# Patient Record
Sex: Female | Born: 1935 | Race: White | Hispanic: No | Marital: Married | Smoking: Never smoker
Health system: Southern US, Community
[De-identification: ages and names within clinical notes are randomized; demographics above are authoritative.]

## PROBLEM LIST (undated history)

## (undated) DIAGNOSIS — E079 Disorder of thyroid, unspecified: Secondary | ICD-10-CM

## (undated) DIAGNOSIS — R011 Cardiac murmur, unspecified: Secondary | ICD-10-CM

## (undated) DIAGNOSIS — E039 Hypothyroidism, unspecified: Secondary | ICD-10-CM

## (undated) DIAGNOSIS — M810 Age-related osteoporosis without current pathological fracture: Secondary | ICD-10-CM

## (undated) DIAGNOSIS — T7840XA Allergy, unspecified, initial encounter: Secondary | ICD-10-CM

## (undated) DIAGNOSIS — G629 Polyneuropathy, unspecified: Secondary | ICD-10-CM

## (undated) DIAGNOSIS — M1990 Unspecified osteoarthritis, unspecified site: Secondary | ICD-10-CM

## (undated) DIAGNOSIS — K219 Gastro-esophageal reflux disease without esophagitis: Secondary | ICD-10-CM

## (undated) DIAGNOSIS — I499 Cardiac arrhythmia, unspecified: Secondary | ICD-10-CM

## (undated) DIAGNOSIS — E785 Hyperlipidemia, unspecified: Secondary | ICD-10-CM

## (undated) DIAGNOSIS — H9190 Unspecified hearing loss, unspecified ear: Secondary | ICD-10-CM

## (undated) DIAGNOSIS — H269 Unspecified cataract: Secondary | ICD-10-CM

## (undated) DIAGNOSIS — I10 Essential (primary) hypertension: Secondary | ICD-10-CM

## (undated) DIAGNOSIS — G43109 Migraine with aura, not intractable, without status migrainosus: Secondary | ICD-10-CM

## (undated) DIAGNOSIS — M8580 Other specified disorders of bone density and structure, unspecified site: Secondary | ICD-10-CM

## (undated) DIAGNOSIS — R609 Edema, unspecified: Secondary | ICD-10-CM

## (undated) HISTORY — DX: Age-related osteoporosis without current pathological fracture: M81.0

## (undated) HISTORY — DX: Disorder of thyroid, unspecified: E07.9

## (undated) HISTORY — PX: TONSILLECTOMY: SUR1361

## (undated) HISTORY — PX: EYE SURGERY: SHX253

## (undated) HISTORY — DX: Cardiac murmur, unspecified: R01.1

## (undated) HISTORY — DX: Migraine with aura, not intractable, without status migrainosus: G43.109

## (undated) HISTORY — PX: OTHER SURGICAL HISTORY: SHX169

## (undated) HISTORY — DX: Essential (primary) hypertension: I10

## (undated) HISTORY — DX: Hyperlipidemia, unspecified: E78.5

## (undated) HISTORY — DX: Unspecified cataract: H26.9

## (undated) HISTORY — DX: Other specified disorders of bone density and structure, unspecified site: M85.80

## (undated) HISTORY — DX: Allergy, unspecified, initial encounter: T78.40XA

---

## 1994-04-18 HISTORY — PX: DOPPLER ECHOCARDIOGRAPHY: SHX263

## 1997-08-03 ENCOUNTER — Ambulatory Visit (HOSPITAL_COMMUNITY): Admission: RE | Admit: 1997-08-03 | Discharge: 1997-08-03 | Payer: Self-pay | Admitting: Obstetrics & Gynecology

## 1998-12-12 ENCOUNTER — Encounter: Admission: RE | Admit: 1998-12-12 | Discharge: 1998-12-12 | Payer: Self-pay | Admitting: Family Medicine

## 1998-12-12 ENCOUNTER — Encounter: Payer: Self-pay | Admitting: Family Medicine

## 1999-12-13 ENCOUNTER — Encounter: Payer: Self-pay | Admitting: Obstetrics and Gynecology

## 1999-12-13 ENCOUNTER — Encounter: Admission: RE | Admit: 1999-12-13 | Discharge: 1999-12-13 | Payer: Self-pay | Admitting: Obstetrics and Gynecology

## 2000-12-24 ENCOUNTER — Encounter: Admission: RE | Admit: 2000-12-24 | Discharge: 2000-12-24 | Payer: Self-pay | Admitting: Obstetrics and Gynecology

## 2000-12-24 ENCOUNTER — Encounter: Payer: Self-pay | Admitting: Obstetrics and Gynecology

## 2001-02-19 ENCOUNTER — Other Ambulatory Visit: Admission: RE | Admit: 2001-02-19 | Discharge: 2001-02-19 | Payer: Self-pay | Admitting: Obstetrics and Gynecology

## 2001-12-30 ENCOUNTER — Encounter: Admission: RE | Admit: 2001-12-30 | Discharge: 2001-12-30 | Payer: Self-pay | Admitting: Obstetrics and Gynecology

## 2001-12-30 ENCOUNTER — Encounter: Payer: Self-pay | Admitting: Obstetrics and Gynecology

## 2002-02-25 ENCOUNTER — Other Ambulatory Visit: Admission: RE | Admit: 2002-02-25 | Discharge: 2002-02-25 | Payer: Self-pay | Admitting: Obstetrics and Gynecology

## 2003-02-06 ENCOUNTER — Encounter: Admission: RE | Admit: 2003-02-06 | Discharge: 2003-02-06 | Payer: Self-pay | Admitting: Family Medicine

## 2004-01-16 ENCOUNTER — Ambulatory Visit: Payer: Self-pay | Admitting: Family Medicine

## 2004-01-26 ENCOUNTER — Ambulatory Visit: Payer: Self-pay | Admitting: Family Medicine

## 2004-01-31 ENCOUNTER — Ambulatory Visit: Payer: Self-pay | Admitting: Family Medicine

## 2004-02-29 ENCOUNTER — Encounter: Admission: RE | Admit: 2004-02-29 | Discharge: 2004-02-29 | Payer: Self-pay | Admitting: Obstetrics and Gynecology

## 2004-08-01 ENCOUNTER — Ambulatory Visit: Payer: Self-pay | Admitting: Family Medicine

## 2004-12-13 ENCOUNTER — Ambulatory Visit: Payer: Self-pay | Admitting: Internal Medicine

## 2005-01-23 ENCOUNTER — Ambulatory Visit: Payer: Self-pay | Admitting: Family Medicine

## 2005-01-27 ENCOUNTER — Ambulatory Visit: Payer: Self-pay | Admitting: Family Medicine

## 2005-04-04 ENCOUNTER — Encounter: Admission: RE | Admit: 2005-04-04 | Discharge: 2005-04-04 | Payer: Self-pay | Admitting: Obstetrics and Gynecology

## 2005-04-10 ENCOUNTER — Ambulatory Visit: Payer: Self-pay | Admitting: Family Medicine

## 2005-07-28 ENCOUNTER — Ambulatory Visit: Payer: Self-pay | Admitting: Family Medicine

## 2005-10-18 ENCOUNTER — Encounter: Payer: Self-pay | Admitting: Family Medicine

## 2005-10-18 LAB — CONVERTED CEMR LAB: Pap Smear: NORMAL

## 2005-12-11 ENCOUNTER — Ambulatory Visit: Payer: Self-pay | Admitting: Internal Medicine

## 2005-12-22 LAB — HM DEXA SCAN

## 2006-01-27 ENCOUNTER — Ambulatory Visit: Payer: Self-pay | Admitting: Family Medicine

## 2006-01-29 ENCOUNTER — Ambulatory Visit: Payer: Self-pay | Admitting: Family Medicine

## 2006-04-06 ENCOUNTER — Encounter: Admission: RE | Admit: 2006-04-06 | Discharge: 2006-04-06 | Payer: Self-pay | Admitting: Family Medicine

## 2006-08-05 ENCOUNTER — Encounter: Payer: Self-pay | Admitting: Family Medicine

## 2006-08-05 DIAGNOSIS — R030 Elevated blood-pressure reading, without diagnosis of hypertension: Secondary | ICD-10-CM | POA: Insufficient documentation

## 2006-08-05 DIAGNOSIS — J309 Allergic rhinitis, unspecified: Secondary | ICD-10-CM

## 2006-08-05 DIAGNOSIS — N951 Menopausal and female climacteric states: Secondary | ICD-10-CM

## 2006-08-05 DIAGNOSIS — H918X9 Other specified hearing loss, unspecified ear: Secondary | ICD-10-CM | POA: Insufficient documentation

## 2006-08-05 DIAGNOSIS — I059 Rheumatic mitral valve disease, unspecified: Secondary | ICD-10-CM

## 2006-08-05 DIAGNOSIS — M8580 Other specified disorders of bone density and structure, unspecified site: Secondary | ICD-10-CM

## 2006-08-05 DIAGNOSIS — E785 Hyperlipidemia, unspecified: Secondary | ICD-10-CM

## 2006-08-05 DIAGNOSIS — R002 Palpitations: Secondary | ICD-10-CM | POA: Insufficient documentation

## 2006-08-05 DIAGNOSIS — E039 Hypothyroidism, unspecified: Secondary | ICD-10-CM | POA: Insufficient documentation

## 2006-08-05 DIAGNOSIS — Z8679 Personal history of other diseases of the circulatory system: Secondary | ICD-10-CM | POA: Insufficient documentation

## 2006-08-06 DIAGNOSIS — T7840XA Allergy, unspecified, initial encounter: Secondary | ICD-10-CM | POA: Insufficient documentation

## 2006-08-17 ENCOUNTER — Ambulatory Visit: Payer: Self-pay | Admitting: Family Medicine

## 2006-11-02 ENCOUNTER — Ambulatory Visit: Payer: Self-pay | Admitting: Family Medicine

## 2006-11-02 LAB — CONVERTED CEMR LAB
Bilirubin Urine: NEGATIVE
Ketones, urine, test strip: NEGATIVE
Nitrite: NEGATIVE
Specific Gravity, Urine: 1.01
pH: 6

## 2007-02-05 ENCOUNTER — Ambulatory Visit: Payer: Self-pay | Admitting: Family Medicine

## 2007-02-05 LAB — CONVERTED CEMR LAB
ALT: 21 units/L (ref 0–35)
Albumin: 4.1 g/dL (ref 3.5–5.2)
Alkaline Phosphatase: 63 units/L (ref 39–117)
CO2: 31 meq/L (ref 19–32)
Calcium: 9.8 mg/dL (ref 8.4–10.5)
Potassium: 5.4 meq/L — ABNORMAL HIGH (ref 3.5–5.1)
Sodium: 137 meq/L (ref 135–145)
Total Bilirubin: 1.1 mg/dL (ref 0.3–1.2)
Total Protein: 6.6 g/dL (ref 6.0–8.3)

## 2007-02-22 ENCOUNTER — Ambulatory Visit: Payer: Self-pay | Admitting: Family Medicine

## 2007-03-23 ENCOUNTER — Ambulatory Visit: Payer: Self-pay | Admitting: Family Medicine

## 2007-03-24 ENCOUNTER — Encounter (INDEPENDENT_AMBULATORY_CARE_PROVIDER_SITE_OTHER): Payer: Self-pay | Admitting: *Deleted

## 2007-03-29 ENCOUNTER — Ambulatory Visit: Payer: Self-pay | Admitting: Family Medicine

## 2007-03-30 LAB — CONVERTED CEMR LAB: Potassium: 5.1 meq/L (ref 3.5–5.1)

## 2007-04-08 ENCOUNTER — Encounter: Admission: RE | Admit: 2007-04-08 | Discharge: 2007-04-08 | Payer: Self-pay | Admitting: Family Medicine

## 2007-04-14 ENCOUNTER — Encounter (INDEPENDENT_AMBULATORY_CARE_PROVIDER_SITE_OTHER): Payer: Self-pay | Admitting: *Deleted

## 2007-04-28 ENCOUNTER — Ambulatory Visit: Payer: Self-pay | Admitting: Family Medicine

## 2007-12-16 ENCOUNTER — Ambulatory Visit: Payer: Self-pay | Admitting: Family Medicine

## 2008-01-06 ENCOUNTER — Ambulatory Visit: Payer: Self-pay | Admitting: Internal Medicine

## 2008-01-06 ENCOUNTER — Encounter: Payer: Self-pay | Admitting: Family Medicine

## 2008-02-02 ENCOUNTER — Ambulatory Visit: Payer: Self-pay | Admitting: Family Medicine

## 2008-03-23 ENCOUNTER — Ambulatory Visit: Payer: Self-pay | Admitting: Family Medicine

## 2008-03-24 LAB — CONVERTED CEMR LAB
AST: 28 units/L (ref 0–37)
Albumin: 4.2 g/dL (ref 3.5–5.2)
Alkaline Phosphatase: 57 units/L (ref 39–117)
Bilirubin, Direct: 0.1 mg/dL (ref 0.0–0.3)
Calcium: 9.7 mg/dL (ref 8.4–10.5)
Chloride: 107 meq/L (ref 96–112)
Cholesterol: 164 mg/dL (ref 0–200)
Eosinophils Absolute: 0.1 10*3/uL (ref 0.0–0.7)
Eosinophils Relative: 2.1 % (ref 0.0–5.0)
GFR calc Af Amer: 70 mL/min
GFR calc non Af Amer: 58 mL/min
HCT: 44.1 % (ref 36.0–46.0)
HDL: 65.6 mg/dL (ref >39.0)
Monocytes Absolute: 0.6 10*3/uL (ref 0.1–1.0)
Neutro Abs: 3.9 10*3/uL (ref 1.4–7.7)
Neutrophils Relative %: 56.6 % (ref 43.0–77.0)
Sodium: 141 meq/L (ref 135–145)
Total Bilirubin: 0.8 mg/dL (ref 0.3–1.2)
Triglycerides: 94 mg/dL (ref 0–149)
VLDL: 19 mg/dL (ref 0–40)

## 2008-03-28 ENCOUNTER — Ambulatory Visit: Payer: Self-pay | Admitting: Family Medicine

## 2008-04-10 ENCOUNTER — Encounter: Admission: RE | Admit: 2008-04-10 | Discharge: 2008-04-10 | Payer: Self-pay | Admitting: Family Medicine

## 2008-04-12 ENCOUNTER — Encounter (INDEPENDENT_AMBULATORY_CARE_PROVIDER_SITE_OTHER): Payer: Self-pay | Admitting: *Deleted

## 2008-05-16 ENCOUNTER — Ambulatory Visit: Payer: Self-pay | Admitting: Family Medicine

## 2008-05-16 ENCOUNTER — Encounter (INDEPENDENT_AMBULATORY_CARE_PROVIDER_SITE_OTHER): Payer: Self-pay | Admitting: *Deleted

## 2008-05-16 LAB — CONVERTED CEMR LAB
OCCULT 1: NEGATIVE
OCCULT 3: NEGATIVE

## 2008-06-22 ENCOUNTER — Ambulatory Visit: Payer: Self-pay | Admitting: Family Medicine

## 2008-06-22 LAB — CONVERTED CEMR LAB
CO2: 32 meq/L (ref 19–32)
Glucose, Bld: 91 mg/dL (ref 70–99)
Sodium: 139 meq/L (ref 135–145)

## 2008-06-27 ENCOUNTER — Ambulatory Visit: Payer: Self-pay | Admitting: Family Medicine

## 2008-08-03 ENCOUNTER — Ambulatory Visit: Payer: Self-pay | Admitting: Family Medicine

## 2008-08-03 LAB — CONVERTED CEMR LAB
BUN: 19 mg/dL (ref 6–23)
Calcium: 9.5 mg/dL (ref 8.4–10.5)
Creatinine, Ser: 1 mg/dL (ref 0.4–1.2)
GFR calc non Af Amer: 57.8 mL/min (ref >60)
Glucose, Bld: 95 mg/dL (ref 70–99)

## 2008-08-08 ENCOUNTER — Ambulatory Visit: Payer: Self-pay | Admitting: Family Medicine

## 2008-09-21 ENCOUNTER — Ambulatory Visit: Payer: Self-pay | Admitting: Family Medicine

## 2009-03-30 ENCOUNTER — Ambulatory Visit: Payer: Self-pay | Admitting: Family Medicine

## 2009-03-31 LAB — CONVERTED CEMR LAB
Alkaline Phosphatase: 62 units/L (ref 39–117)
BUN: 17 mg/dL (ref 6–23)
Basophils Absolute: 0.1 10*3/uL (ref 0.0–0.1)
CO2: 30 meq/L (ref 19–32)
Calcium: 9.7 mg/dL (ref 8.4–10.5)
Chloride: 102 meq/L (ref 96–112)
Free T4: 1.3 ng/dL (ref 0.6–1.6)
GFR calc non Af Amer: 57.69 mL/min (ref >60)
HCT: 44.6 % (ref 36.0–46.0)
Lymphocytes Relative: 27.9 % (ref 12.0–46.0)
Lymphs Abs: 2.1 10*3/uL (ref 0.7–4.0)
Monocytes Absolute: 0.6 10*3/uL (ref 0.1–1.0)
Neutrophils Relative %: 61.4 % (ref 43.0–77.0)
RBC: 4.75 M/uL (ref 3.87–5.11)
RDW: 13.5 % (ref 11.5–14.6)
Sodium: 138 meq/L (ref 135–145)
TSH: 0.8 microintl units/mL (ref 0.35–5.50)
Total Bilirubin: 0.8 mg/dL (ref 0.3–1.2)
Total Protein: 6.9 g/dL (ref 6.0–8.3)
Triglycerides: 83 mg/dL (ref 0.0–149.0)
WBC: 7.7 10*3/uL (ref 4.5–10.5)

## 2009-04-04 ENCOUNTER — Ambulatory Visit: Payer: Self-pay | Admitting: Family Medicine

## 2009-04-11 ENCOUNTER — Encounter: Admission: RE | Admit: 2009-04-11 | Discharge: 2009-04-11 | Payer: Self-pay | Admitting: Family Medicine

## 2009-06-01 ENCOUNTER — Ambulatory Visit: Payer: Self-pay | Admitting: Family Medicine

## 2009-06-01 LAB — CONVERTED CEMR LAB
OCCULT 1: NEGATIVE
OCCULT 3: NEGATIVE

## 2009-06-04 ENCOUNTER — Encounter (INDEPENDENT_AMBULATORY_CARE_PROVIDER_SITE_OTHER): Payer: Self-pay | Admitting: *Deleted

## 2009-06-22 ENCOUNTER — Ambulatory Visit: Payer: Self-pay | Admitting: Family Medicine

## 2009-06-22 LAB — CONVERTED CEMR LAB
Chloride: 99 meq/L (ref 96–112)
Creatinine, Ser: 0.8 mg/dL (ref 0.4–1.2)
GFR calc non Af Amer: 71.49 mL/min (ref >60)

## 2009-06-25 ENCOUNTER — Ambulatory Visit: Payer: Self-pay | Admitting: Family Medicine

## 2009-06-27 ENCOUNTER — Ambulatory Visit: Payer: Self-pay | Admitting: Family Medicine

## 2009-09-25 ENCOUNTER — Encounter (INDEPENDENT_AMBULATORY_CARE_PROVIDER_SITE_OTHER): Payer: Self-pay | Admitting: *Deleted

## 2009-12-13 ENCOUNTER — Ambulatory Visit: Payer: Self-pay | Admitting: Family Medicine

## 2010-03-05 ENCOUNTER — Ambulatory Visit
Admission: RE | Admit: 2010-03-05 | Discharge: 2010-03-05 | Payer: Self-pay | Source: Home / Self Care | Attending: Family Medicine | Admitting: Family Medicine

## 2010-03-05 DIAGNOSIS — M7710 Lateral epicondylitis, unspecified elbow: Secondary | ICD-10-CM | POA: Insufficient documentation

## 2010-03-10 ENCOUNTER — Encounter: Payer: Self-pay | Admitting: Family Medicine

## 2010-03-21 NOTE — Letter (Signed)
Summary: Results Follow up Letter  [place] at [facility]  [address] [place], [place] [address]   Phone: [phone]  Fax: [phone]    06/04/2009 MRN: [id number]  [facility] [address] [place], [place]  [address]  Dear Ms. Detore,  The following are the results of your recent test(s):  Test         Result    Pap Smear:        Normal _____  Not Normal _____ Comments: ______________________________________________________ Cholesterol: LDL(Bad cholesterol):         Your goal is less than:         HDL (Good cholesterol):       Your goal is more than: Comments:  ______________________________________________________ Mammogram:        Normal _____  Not Normal _____ Comments:  ___________________________________________________________________ Hemoccult:        Normal _X____  Not normal _______ Comments: Please repeat in one year.  _____________________________________________________________________ Other Tests:    We routinely do not discuss normal results over the telephone.  If you desire a copy of the results, or you have any questions about this information we can discuss them at your next office visit.   Sincerely,     Laurita Quint, MD

## 2010-03-21 NOTE — Assessment & Plan Note (Signed)
Summary: CPX / LFW   Vital Signs:  Patient profile:   77 year old female Weight:      149.75 pounds Temp:     98.3 degrees F oral Pulse rate:   64 / minute Pulse rhythm:   regular BP sitting:   140 / 70  (left arm) Cuff size:   regular  Vitals Entered By: Sydell Axon LPN (April 04, 2009 2:00 PM) CC: 30 Minute checkup, sees Dr. Rosemary Holms for her GYN care, hemoccult cards given to patient   History of Present Illness: Pt here for Comp Exam. She has mammo next Wed and appt with Dr Rosemary Holms next month. She had an infected tooth removed last week. She is having fogginess of mind. She alsoi has more difficulty hearing,, part of it she thinks is her husband mumbling. Didn't really walk this Winter.  Preventive Screening-Counseling & Management  Alcohol-Tobacco     Alcohol drinks/day: <1     Alcohol type: occasional wine     Smoking Status: never     Passive Smoke Exposure: no  Caffeine-Diet-Exercise     Caffeine use/day: 2     Does Patient Exercise: no     Type of exercise: walking     Times/week: 3  Problems Prior to Update: 1)  Special Screening Malig Neoplasms Other Sites  (ICD-V76.49) 2)  Other Screening Mammogram  (ICD-V76.12) 3)  Allergy  (ICD-995.3) 4)  Palpitations  (ICD-785.1) 5)  Hearing Loss, High Frequency  (ICD-389.8) 6)  Postmenopausal Status  (ICD-627.2) 7)  Mitral Valve Prolapse  (ICD-424.0) 8)  Osteopenia  (ICD-733.90) 9)  Hypothyroidism  (ICD-244.9) 10)  Hypertension  (ICD-401.9) 11)  Hyperlipidemia  (ICD-272.4) 12)  Allergic Rhinitis  (ICD-477.9)  Medications Prior to Update: 1)  Synthroid 100 Mcg Tabs (Levothyroxine Sodium) .Marland Kitchen.. 1 By Mouth Once Daily 2)  Atenolol 25 Mg Tabs (Atenolol) .... 1/2 By Mouth Once Daily 3)  Lipitor 20 Mg Tabs (Atorvastatin Calcium) .Marland Kitchen.. 1 By Mouth Once Daily 4)  Caltrate 600   Tabs (Calcium Carbonate Tabs) .Marland Kitchen.. 1 By Mouth Daily 5)  One-Daily Multivitamins   Tabs (Multiple Vitamin) .Marland Kitchen.. 1 By Mouth Daily 6)  Amoxicillin   Tabs  (Amoxicillin Tabs) .... 2 Grams 1 Hour Prior To Dental Appointment (Prophylaxis) 7)  Eq Saline Nasal Spray   Soln (Saline Soln) .... Prn 8)  Lisinopril 5 Mg Tabs (Lisinopril) .... 1/2  Tab By Mouth in Mornings 9)  Aleve 220 Mg Tabs (Naproxen Sodium) .... As Needed Pain 10)  Claritin 10 Mg Tabs (Loratadine) .Marland Kitchen.. 1 As Needed For Allergies By Mouth  Allergies: No Known Drug Allergies  Past History:  Past Medical History: Last updated: Aug 29, 2006 Allergic rhinitis Hyperlipidemia Hypertension Hypothyroidism Osteopenia  Past Surgical History: Last updated: August 29, 2006                  NSVD x 2 as child     HOS Prolonged Fever sev days (?Rheum Fever?) as child    Tonsillectomy 3/96          Echo - MVP with MR (TR) - prophylaxis 96             Flex - nml 40 cm. 03/11/02     DEXA - osteopenia                  Holter - NSR, freq. PVC's, rare colp, freq. 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 Eyes:  Denies blurring, discharge, and eye pain; has yearly eye exam. ENT:  Complains of decreased hearing and  ringing in ears; denies ear discharge and earache. CV:  Complains of shortness of breath with exertion; denies chest pain or discomfort, fainting, fatigue, palpitations, swelling of feet, and swelling of hands; occas from being out of shape.Marland Kitchen Resp:  Denies cough, shortness of breath, and wheezing; mild congesytion with chronic PND. GI:  Complains of indigestion; denies abdominal pain, bloody stools, change in bowel habits, constipation, dark tarry stools, diarrhea, hemorrhoids, loss of appetite, nausea, vomiting, vomiting blood, and yellowish skin color. GU:  Denies dysuria, hematuria, nocturia, and urinary frequency. MS:  Complains of cramps; denies joint pain, joint swelling, low back pain, muscle aches, muscle weakness, and stiffness; rare f feet.. Derm:  Complains of dryness; denies itching and rash. Neuro:  Complains of poor balance; denies numbness, tingling, and tremors; mild with fast turning.Marland Kitchen  Physical Exam  General:  Well-developed,well-nourished,in no acute distress; alert,appropriate and cooperative throughout examination Head:  Normocephalic and atraumatic without obvious abnormalities. No apparent alopecia or balding. Sinuses NT. Eyes:  Conjunctiva clear bilaterally.  Ears:  External ear exam shows no significant lesions or deformities.  Otoscopic examination reveals clear canals, tympanic membranes are intact bilaterally without bulging, retraction, inflammation or discharge. Hearing is grossly normal bilaterally. Nose:  External nasal examination shows no deformity or inflammation. Nasal mucosa are pink and moist without lesions or exudates. Mouth:  Oral mucosa and oropharynx without lesions or exudates.  Teeth in good repair. Mild thick gray PND on the posterior pharynx. Neck:  No deformities, masses, or tenderness noted. Chest Wall:  No deformities, masses, or tenderness noted. Breasts:  Not done Lungs:  Normal respiratory effort, chest expands symmetrically. Lungs are clear  to auscultation, no crackles or wheezes. Heart:  Normal rate and regular rhythm. S1 and S2 normal without gallop, murmur, click, rub or other extra sounds. Rectal:  Not done Genitalia:  Not done Msk:  No deformity or scoliosis noted of thoracic or lumbar spine.   Pulses:  R and L carotid,radial,femoral,dorsalis pedis and posterior tibial pulses are full and equal bilaterally Extremities:  No clubbing, cyanosis, edema, or deformity noted with normal full range of motion of all joints.   Neurologic:  No cranial nerve deficits noted. Station and gait are normal. Plantar reflexes are down-going bilaterally. DTRs are symmetrical throughout. Sensory, motor and coordinative functions appear intact. Skin:  Intact without suspicious lesions or rashes Cervical Nodes:  No lymphadenopathy noted Inguinal Nodes:  No significant adenopathy Psych:  Cognition and judgment appear intact. Alert and cooperative with normal attention span and concentration. No apparent delusions, illusions, hallucinations   Impression & Recommendations:  Problem # 1:  HYPERTENSION (ICD-401.9)  Assessment Deteriorated Mildly elevated today...has been at home as well. Will add Hctz and move Lisinopril to nighttime. Her updated medication list for this problem includes:    Atenolol 25 Mg Tabs (Atenolol) .Marland Kitchen... 1/2 by mouth once daily    Lisinopril 5 Mg Tabs (Lisinopril) .Marland Kitchen... 1/2  tab by mouth in evening    Hydrochlorothiazide 12.5 Mg Tabs (Hydrochlorothiazide) ..... One tab by mouth in am  BP today: 140/70 Prior BP: 138/68 (09/21/2008)  Labs Reviewed: K+: 5.1 (03/30/2009) Creat: : 1.0 (03/30/2009)   Chol: 159 (03/30/2009)   HDL: 68.30 (03/30/2009)   LDL: 74 (03/30/2009)   TG: 83.0 (03/30/2009)  Problem # 2:  HEARING LOSS, HIGH FREQUENCY (ICD-389.8) Assessment: Deteriorated Percieved to be mildly worse. To audiology when she deems appropriate  for eval.  Problem # 3:  ALLERGY (ICD-995.3) Assessment:  Unchanged Stable.  Problem # 4:  HYPOTHYROIDISM (ICD-244.9) Euthyroid on current replacement. Cont. Her updated medication list for this problem includes:    Synthroid 100 Mcg Tabs (Levothyroxine sodium) .Marland Kitchen... 1 by mouth once daily  Labs Reviewed: TSH: 0.80 (03/30/2009)    Chol: 159 (03/30/2009)   HDL: 68.30 (03/30/2009)   LDL: 74 (03/30/2009)   TG: 83.0 (03/30/2009)  Problem # 5:  HYPERLIPIDEMIA (ICD-272.4) Assessment: Unchanged Adequately well controlled. Cont Lipitor. Her updated medication list for this problem includes:    Lipitor 20 Mg Tabs (Atorvastatin calcium) .Marland Kitchen... 1 by mouth once daily  Labs Reviewed: SGOT: 27 (03/30/2009)   SGPT: 19 (03/30/2009)   HDL:68.30 (03/30/2009), 65.6 (03/23/2008)  LDL:74 (03/30/2009), 80 (03/23/2008)  Chol:159 (03/30/2009), 164 (03/23/2008)  Trig:83.0 (03/30/2009), 94 (03/23/2008)  Problem # 6:  ALLERGIC RHINITIS (ICD-477.9) Assessment: Unchanged  Stable, feels her PND has improved lately. Her updated medication list for this problem includes:    Eq Saline Nasal Spray Soln (Saline soln) .Marland Kitchen... Prn    Claritin 10 Mg Tabs (Loratadine) .Marland Kitchen... 1 as needed for allergies by mouth  Discussed use of allergy medications and environmental measures.   Complete Medication List: 1)  Synthroid 100 Mcg Tabs (Levothyroxine sodium) .Marland Kitchen.. 1 by mouth once daily 2)  Atenolol 25 Mg Tabs (Atenolol) .... 1/2 by mouth once daily 3)  Lipitor 20 Mg Tabs (Atorvastatin calcium) .Marland Kitchen.. 1 by mouth once daily 4)  Caltrate 600 Tabs (Calcium carbonate tabs) .Marland Kitchen.. 1 by mouth daily 5)  One-daily Multivitamins Tabs (Multiple vitamin) .Marland Kitchen.. 1 by mouth daily 6)  Amoxicillin Tabs (Amoxicillin tabs) .... 2 grams 1 hour prior to dental appointment (prophylaxis) 7)  Eq Saline Nasal Spray Soln (Saline soln) .... Prn 8)  Lisinopril 5 Mg Tabs (Lisinopril) .... 1/2  tab by mouth in evening 9)  Aleve 220 Mg Tabs (Naproxen sodium) .... As needed pain 10)  Claritin 10 Mg Tabs (Loratadine)  .Marland Kitchen.. 1 as needed for allergies by mouth 11)  Hydrochlorothiazide 12.5 Mg Tabs (Hydrochlorothiazide) .... One tab by mouth in am  Patient Instructions: 1)  RTC 3 mos, BMET prior 401.9 Prescriptions: HYDROCHLOROTHIAZIDE 12.5 MG TABS (HYDROCHLOROTHIAZIDE) one tab by mouth in AM  #30 x 12   Entered and Authorized by:   Shaune Leeks MD   Signed by:   Shaune Leeks MD on 04/04/2009   Method used:   [facility]        [facility]  [address] (retail)       [address]       [place], [place]  [address]       Ph: [phone] or [phone]  Fax: [phone]   RxID:   [id number]   Current Allergies (reviewed today): No known allergies

## 2010-03-21 NOTE — Letter (Signed)
Summary: Nadara Eaton letter  [place] at [facility]  [address] [place], [place] [address]   Phone: [phone]  Fax: [phone]       09/25/2009 MRN: [id number]  [facility] [address] [place], [place]  [address]  Dear Ms. Reita [facility] [place] and [place] announce the retirement of Arta Silence, M.D., from full-time practice at the [facility] office effective August 16, 2009 and his plans of returning part-time.  It is important to Dr. Hetty Ely and to our practice that you understand that [facility] has seven physicians in our office for your health care needs.  We will continue to offer the same exceptional care that you have today.    Dr. Hetty Ely has spoken to many of you about his plans for retirement and returning part-time in the fall.   We will continue to work with you through the transition to schedule appointments for you in the office and meet the high standards that [facility] is committed to.   Again, it is with great pleasure that we share the news that Dr. Hetty Ely will return to [facility] at [facility] in October of 2011 with a reduced schedule.    If you have any questions, or would like to request an appointment with one of our physicians, please call us at [phone] and press the option for Scheduling an appointment.  We take pleasure in providing you with excellent patient care and look forward to seeing you at your next office visit.  Our [facility] Physicians are:  Tillman Abide, M.D. Laurita Quint, M.D. Roxy Manns, M.D. Kerby Nora, M.D. Hannah Beat, M.D. Ruthe Mannan, M.D. We proudly welcomed Raechel Ache, M.D. and Eustaquio Boyden, M.D. to the practice in July/August 2011.  Sincerely,  [facility]

## 2010-03-21 NOTE — Assessment & Plan Note (Signed)
Summary: COLD/CLE   Vital Signs:  Patient profile:   77 year old female Weight:      146.50 pounds BMI:     25.64 O2 Sat:      94 % on Room air Temp:     99.4 degrees F oral Pulse rate:   84 / minute Pulse rhythm:   regular BP sitting:   130 / 72  (left arm) Cuff size:   regular  Vitals Entered By: Sydell Axon LPN (Jun 25, 1608 11:33 AM)  O2 Flow:  Room air CC: Cough, productive at times, left ear pain with bloody drainage, chest congestion and some SOB   History of Present Illness: Pt hee for congestion that started last Tues, hoarse since Wed. She started as a tickle in the throat with PND, then developed congestion in the upper chest with hoarseness also. She has had frequent coughing day and night but can't get rid oof congestion. She has a rumbling sensation that improves with cough in the RLL of lungfield/upper abd. She has had congested head and last night L TM may hav ruptured with signif acute pain last night for five mins or so, then resolution of pain with dischsarge and blood...alll w/o ear pain prior...she thinks rupture. For most of the week, tempp nmll to 99.5, last night up to 102. Last night she took Amox 500mg  x once last night. The fever came down almost immediately. She has taken Aleve a couple times, Afrin and used Vicks on chest, taking Mucous Releif. Hot drinks, Chicken noodle soups, lozenges, etc. She has done lots internet remedies.   Problems Prior to Update: 1)  Special Screening Malig Neoplasms Other Sites  (ICD-V76.49) 2)  Other Screening Mammogram  (ICD-V76.12) 3)  Allergy  (ICD-995.3) 4)  Palpitations  (ICD-785.1) 5)  Hearing Loss, High Frequency  (ICD-389.8) 6)  Postmenopausal Status  (ICD-627.2) 7)  Mitral Valve Prolapse  (ICD-424.0) 8)  Osteopenia  (ICD-733.90) 9)  Hypothyroidism  (ICD-244.9) 10)  Hypertension  (ICD-401.9) 11)  Hyperlipidemia  (ICD-272.4) 12)  Allergic Rhinitis  (ICD-477.9)  Medications Prior to Update: 1)  Synthroid 100 Mcg  Tabs (Levothyroxine Sodium) .Marland Kitchen.. 1 By Mouth Once Daily 2)  Atenolol 25 Mg Tabs (Atenolol) .... 1/2 By Mouth Once Daily 3)  Lipitor 20 Mg Tabs (Atorvastatin Calcium) .Marland Kitchen.. 1 By Mouth Once Daily 4)  Caltrate 600   Tabs (Calcium Carbonate Tabs) .Marland Kitchen.. 1 By Mouth Daily 5)  One-Daily Multivitamins   Tabs (Multiple Vitamin) .Marland Kitchen.. 1 By Mouth Daily 6)  Amoxicillin   Tabs (Amoxicillin Tabs) .... 2 Grams 1 Hour Prior To Dental Appointment (Prophylaxis) 7)  Eq Saline Nasal Spray   Soln (Saline Soln) .... Prn 8)  Lisinopril 5 Mg Tabs (Lisinopril) .... 1/2  Tab By Mouth in Evening 9)  Aleve 220 Mg Tabs (Naproxen Sodium) .... As Needed Pain 10)  Claritin 10 Mg Tabs (Loratadine) .Marland Kitchen.. 1 As Needed For Allergies By Mouth 11)  Hydrochlorothiazide 12.5 Mg Tabs (Hydrochlorothiazide) .... One Tab By Mouth in Am  Allergies: No Known Drug Allergies  Physical Exam  General:  Well-developed,well-nourished,in no acute distress; alert,appropriate and cooperative throughout examination, congested and hoarse. Head:  Normocephalic and atraumatic without obvious abnormalities. No apparent alopecia or balding. Sinuses NT. Eyes:  Conjunctiva clear bilaterally.  Ears:  R TM nml. L TM inflamed and bloody with pustules on the TM Nose:  External nasal examination shows no deformity or inflammation. Nasal mucosa are pink and moist without lesions or exudates. Mouth:  Oral mucosa and oropharynx without lesions or exudates.  Teeth in good repair. Mild thick gray PND on the posterior pharynx. Neck:  No deformities, masses, or tenderness noted. Lungs:  Normal respiratory effort, chest expands symmetrically. Lungs are clear to auscultation, no crackles or wheezes but mild ant midchest ronchi. Heart:  Normal rate and regular rhythm. S1 and S2 normal without gallop, murmur, click, rub or other extra sounds.   Impression & Recommendations:  Problem # 1:  URI (ICD-465.9) Assessment New  See instructions. Her updated medication list  for this problem includes:    Aleve 220 Mg Tabs (Naproxen sodium) .Marland Kitchen... As needed pain    Claritin 10 Mg Tabs (Loratadine) .Marland Kitchen... 1 as needed for allergies by mouth    Tessalon 200 Mg Caps (Benzonatate) ..... One tab by mouth three times a day as needed for cough during day.    Tussionex Pennkinetic Er 8-10 Mg/38ml Lqcr (Chlorpheniramine-hydrocodone) ..... One tsp by mouth at night as needed  for cough  Instructed on symptomatic treatment. Call if symptoms persist or worsen.   Problem # 2:  BRONCHITIS- ACUTE (ICD-466.0) Assessment: New  See instructions. Her updated medication list for this problem includes:    Amoxicillin Tabs (Amoxicillin tabs) .Marland Kitchen... 2 grams 1 hour prior to dental appointment (prophylaxis)    Tessalon 200 Mg Caps (Benzonatate) ..... One tab by mouth three times a day as needed for cough during day.    Tussionex Pennkinetic Er 8-10 Mg/15ml Lqcr (Chlorpheniramine-hydrocodone) ..... One tsp by mouth at night as needed  for cough    Amoxicillin 500 Mg Caps (Amoxicillin) .Marland Kitchen... 2 tabs by mouth two times a day  Take antibiotics and other medications as directed. Encouraged to push clear liquids, get enough rest, and take acetaminophen as needed. To be seen in 5-7 days if no improvement, sooner if worse.  Complete Medication List: 1)  Synthroid 100 Mcg Tabs (Levothyroxine sodium) .Marland Kitchen.. 1 by mouth once daily 2)  Atenolol 25 Mg Tabs (Atenolol) .... 1/2 by mouth once daily 3)  Lipitor 20 Mg Tabs (Atorvastatin calcium) .Marland Kitchen.. 1 by mouth once daily 4)  Caltrate 600 Tabs (Calcium carbonate tabs) .Marland Kitchen.. 1 by mouth daily 5)  One-daily Multivitamins Tabs (Multiple vitamin) .Marland Kitchen.. 1 by mouth daily 6)  Amoxicillin Tabs (Amoxicillin tabs) .... 2 grams 1 hour prior to dental appointment (prophylaxis) 7)  Eq Saline Nasal Spray Soln (Saline soln) .... Prn 8)  Lisinopril 5 Mg Tabs (Lisinopril) .... 1/2  tab by mouth in evening 9)  Aleve 220 Mg Tabs (Naproxen sodium) .... As needed pain 10)  Claritin  10 Mg Tabs (Loratadine) .Marland Kitchen.. 1 as needed for allergies by mouth 11)  Hydrochlorothiazide 12.5 Mg Tabs (Hydrochlorothiazide) .... One tab by mouth in am 12)  Tessalon 200 Mg Caps (Benzonatate) .... One tab by mouth three times a day as needed for cough during day. 13)  Tussionex Pennkinetic Er 8-10 Mg/5ml Lqcr (Chlorpheniramine-hydrocodone) .... One tsp by mouth at night as needed  for cough 14)  Amoxicillin 500 Mg Caps (Amoxicillin) .... 2 tabs by mouth two times a day  Patient Instructions: 1)  Take Amox 500mg  2 tabs two times a day  2)  Take Guaifenesin by [facility] or [facility] and getting MUCOUS RELIEF EXPECTORANT (400mg ), take 11/2 tabs by mouth AM and NOON. Take 800mg  to 1000mg  insytead. 3)  Drink lots of fluids anytime taking Guaifenesin.  4)  Take TYL ES 2 tabs three times a day. 5)  Gargle  with warm salt water every half hour for two days. 6)  Keep lozenge in mouth. 7)  Take Tessalon three times a day  8)  Tussionex at night. Prescriptions: AMOXICILLIN 500 MG CAPS (AMOXICILLIN) 2 tabs by mouth two times a day  #40 x 0   Entered and Authorized by:   Shaune Leeks MD   Signed by:   Shaune Leeks MD on 06/25/2009   Method used:   [facility]        [facility]  [id number]       [address]       [place], [place]  [address]       Ph: [phone] or [phone]       Fax: [phone]   RxID:   [id number] Sandria Senter ER 8-10 MG/5ML LQCR (CHLORPHENIRAMINE-HYDROCODONE) one tsp by mouth at night as needed  for cough  #8 oz x 0   Entered and Authorized by:   Shaune Leeks MD   Signed by:   Shaune Leeks MD on 06/25/2009   Method used:   Print then Give to Patient   RxID:   [id number] TESSALON 200 MG CAPS (BENZONATATE) one tab by mouth three times a day as needed for cough during day.  #30 x 0   Entered and Authorized by:   Shaune Leeks MD   Signed by:   Shaune Leeks MD on  06/25/2009   Method used:   Print then Give to Patient   RxID:   [id number]   Current Allergies (reviewed today): No known allergies   Appended Document: COLD/CLE

## 2010-03-21 NOTE — Assessment & Plan Note (Signed)
Summary: ROA 3 MTHS CYD   Vital Signs:  Patient profile:   77 year old female Weight:      149 pounds Temp:     99.0 degrees F oral Pulse rate:   84 / minute Pulse rhythm:   regular BP sitting:   160 / 86  (left arm) Cuff size:   regular  Vitals Entered By: Sydell Axon LPN (Jun 27, 2009 3:20 PM) CC: 3 Month follow-up after labs, may not have taken her BP medication last night   History of Present Illness: Pt here for three month followup of HTN after being seen last week fior signif comngestion , URI and TM perf. She is doing better altho still hoarse. She has not had fever. She is out of her routine and thinks she might have miossed her BP meds last night. Her BPs at home have been great...110s/50-60s routinely. She is looking forward to going to their vacation home on [place] next week to go sailing.  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) 2)  Uri  (ICD-465.9) 3)  Bronchitis- Acute  (ICD-466.0) 4)  Special Screening Malig Neoplasms Other Sites  (ICD-V76.49) 5)  Other Screening Mammogram  (ICD-V76.12) 6)  Allergy  (ICD-995.3) 7)  Palpitations  (ICD-785.1) 8)  Hearing Loss, High Frequency  (ICD-389.8) 9)  Postmenopausal Status  (ICD-627.2) 10)  Mitral Valve Prolapse  (ICD-424.0) 11)  Osteopenia  (ICD-733.90) 12)  Hypothyroidism  (ICD-244.9) 13)  Hypertension  (ICD-401.9) 14)  Hyperlipidemia  (ICD-272.4) 15)  Allergic Rhinitis  (ICD-477.9)  Medications Prior to Update: 1)  Synthroid 100 Mcg Tabs (Levothyroxine Sodium) .Marland Kitchen.. 1 By Mouth Once Daily 2)  Atenolol 25 Mg Tabs (Atenolol) .... 1/2 By Mouth Once Daily 3)  Lipitor 20 Mg Tabs (Atorvastatin Calcium) .Marland Kitchen.. 1 By Mouth Once Daily 4)  Caltrate 600   Tabs (Calcium Carbonate Tabs) .Marland Kitchen.. 1 By Mouth Daily 5)  One-Daily Multivitamins   Tabs (Multiple Vitamin) .Marland Kitchen.. 1 By Mouth Daily 6)  Amoxicillin   Tabs (Amoxicillin Tabs) .... 2 Grams 1 Hour Prior To Dental Appointment (Prophylaxis) 7)   Eq Saline Nasal Spray   Soln (Saline Soln) .... Prn 8)  Lisinopril 5 Mg Tabs (Lisinopril) .... 1/2  Tab By Mouth in Evening 9)  Aleve 220 Mg Tabs (Naproxen Sodium) .... As Needed Pain 10)  Claritin 10 Mg Tabs (Loratadine) .Marland Kitchen.. 1 As Needed For Allergies By Mouth 11)  Hydrochlorothiazide 12.5 Mg Tabs (Hydrochlorothiazide) .... One Tab By Mouth in Am 12)  Tessalon 200 Mg Caps (Benzonatate) .... One Tab By Mouth Three Times A Day As Needed For Cough During Day. 13)  Tussionex Pennkinetic Er 8-10 Mg/85ml Lqcr (Chlorpheniramine-Hydrocodone) .... One Tsp By Mouth At Night As Needed  For Cough 14)  Amoxicillin 500 Mg Caps (Amoxicillin) .... 2 Tabs By Mouth Two Times A Day  Allergies: No Known Drug Allergies  Physical Exam  General:  Well-developed,well-nourished,in no acute distress; alert,appropriate and cooperative throughout examination, congested and hoarse. Head:  Normocephalic and atraumatic without obvious abnormalities. No apparent alopecia or balding. Sinuses NT. Eyes:  Conjunctiva clear bilaterally.  Lungs:  Normal respiratory effort, chest expands symmetrically. Lungs are clear to auscultation, no crackles or wheezes but mild ant midchest ronchi, almost resolved.Marland Kitchen Heart:  Normal rate and regular rhythm. S1 and S2 normal without gallop, murmur, click, rub or other extra sounds.   Impression & Recommendations:  Problem # 1:  TYMPANIC MEMBRANE PERFORATION (ICD-384.20) Assessment Unchanged  Problem # 2:  URI (ICD-465.9) Assessment: Improved Seems clearing. Her updated medication list for this problem includes:    Aleve 220 Mg Tabs (Naproxen sodium) .Marland Kitchen... As needed pain    Claritin 10 Mg Tabs (Loratadine) .Marland Kitchen... 1 as needed for allergies by mouth    Tessalon 200 Mg Caps (Benzonatate) ..... One tab by mouth three times a day as needed for cough during day.    Tussionex Pennkinetic Er 8-10 Mg/78ml Lqcr (Chlorpheniramine-hydrocodone) ..... One tsp by mouth at night as needed  for  cough  Problem # 3:  BRONCHITIS- ACUTE (ICD-466.0) Assessment: Improved  Her updated medication list for this problem includes:    Amoxicillin Tabs (Amoxicillin tabs) .Marland Kitchen... 2 grams 1 hour prior to dental appointment (prophylaxis)    Tessalon 200 Mg Caps (Benzonatate) ..... One tab by mouth three times a day as needed for cough during day.    Tussionex Pennkinetic Er 8-10 Mg/75ml Lqcr (Chlorpheniramine-hydrocodone) ..... One tsp by mouth at night as needed  for cough    Amoxicillin 500 Mg Caps (Amoxicillin) .Marland Kitchen... 2 tabs by mouth two times a day  Problem # 4:  HYPERTENSION (ICD-401.9) Assessment: Unchanged Almost certainly misdsed her meds based on home nos, no here and pulse. Will follow as long as she contsd to monitor at home and comes in if is routinely elevated. Her updated medication list for this problem includes:    Atenolol 25 Mg Tabs (Atenolol) .Marland Kitchen... 1/2 by mouth once daily    Lisinopril 5 Mg Tabs (Lisinopril) .Marland Kitchen... 1/2  tab by mouth in evening    Hydrochlorothiazide 12.5 Mg Tabs (Hydrochlorothiazide) ..... One tab by mouth in am  BP today: 160/86 Prior BP: 130/72 (06/25/2009)  Labs Reviewed: K+: 5.0 (06/22/2009) Creat: : 0.8 (06/22/2009)   Chol: 159 (03/30/2009)   HDL: 68.30 (03/30/2009)   LDL: 74 (03/30/2009)   TG: 83.0 (03/30/2009)  Complete Medication List: 1)  Synthroid 100 Mcg Tabs (Levothyroxine sodium) .Marland Kitchen.. 1 by mouth once daily 2)  Atenolol 25 Mg Tabs (Atenolol) .... 1/2 by mouth once daily 3)  Lipitor 20 Mg Tabs (Atorvastatin calcium) .Marland Kitchen.. 1 by mouth once daily 4)  Caltrate 600 Tabs (Calcium carbonate tabs) .Marland Kitchen.. 1 by mouth daily 5)  One-daily Multivitamins Tabs (Multiple vitamin) .Marland Kitchen.. 1 by mouth daily 6)  Amoxicillin Tabs (Amoxicillin tabs) .... 2 grams 1 hour prior to dental appointment (prophylaxis) 7)  Eq Saline Nasal Spray Soln (Saline soln) .... Prn 8)  Lisinopril 5 Mg Tabs (Lisinopril) .... 1/2  tab by mouth in evening 9)  Aleve 220 Mg Tabs (Naproxen sodium)  .... As needed pain 10)  Claritin 10 Mg Tabs (Loratadine) .Marland Kitchen.. 1 as needed for allergies by mouth 11)  Hydrochlorothiazide 12.5 Mg Tabs (Hydrochlorothiazide) .... One tab by mouth in am 12)  Tessalon 200 Mg Caps (Benzonatate) .... One tab by mouth three times a day as needed for cough during day. 13)  Tussionex Pennkinetic Er 8-10 Mg/19ml Lqcr (Chlorpheniramine-hydrocodone) .... One tsp by mouth at night as needed  for cough 14)  Amoxicillin 500 Mg Caps (Amoxicillin) .... 2 tabs by mouth two times a day  Patient Instructions: 1)  Call in Jun for appt in Oct.  Current Allergies (reviewed today): No known allergies

## 2010-03-21 NOTE — Assessment & Plan Note (Signed)
Summary: 3 M F/U BP RECHECK/DLO R/S FROM 1/19   Vital Signs:  Patient profile:   77 year old female Weight:      152 pounds Temp:     97.1 degrees F oral Pulse rate:   64 / minute Pulse rhythm:   regular BP sitting:   140 / 72  (left arm) Cuff size:   regular  Vitals Entered By: Sydell Axon LPN (March 05, 2010 9:47 AM) CC: 3 month follow-up on BP   History of Present Illness: Pt here for BP recheck...she has noticed her pressure going up since the New Year.  She complains of left elbow pain. She and her husband do a lot of kayaking and bowling. The pain is 3-10, sometimes worse when active and has been there a few weeks. She hasn't done anything to treat.  Problems Prior to Update: 1)  Special Screening Malig Neoplasms Other Sites  (ICD-V76.49) 2)  Other Screening Mammogram  (ICD-V76.12) 3)  Allergy  (ICD-995.3) 4)  Palpitations  (ICD-785.1) 5)  Hearing Loss, High Frequency  (ICD-389.8) 6)  Postmenopausal Status  (ICD-627.2) 7)  Mitral Valve Prolapse  (ICD-424.0) 8)  Osteopenia  (ICD-733.90) 9)  Hypothyroidism  (ICD-244.9) 10)  Hypertension  (ICD-401.9) 11)  Hyperlipidemia  (ICD-272.4) 12)  Allergic Rhinitis  (ICD-477.9)  Medications Prior to Update: 1)  Synthroid 100 Mcg Tabs (Levothyroxine Sodium) .Marland Kitchen.. 1 By Mouth Once Daily 2)  Atenolol 25 Mg Tabs (Atenolol) .... 1/2 By Mouth Once Daily 3)  Lipitor 20 Mg Tabs (Atorvastatin Calcium) .Marland Kitchen.. 1 By Mouth Once Daily 4)  Caltrate 600   Tabs (Calcium Carbonate Tabs) .Marland Kitchen.. 1 By Mouth Daily 5)  One-Daily Multivitamins   Tabs (Multiple Vitamin) .Marland Kitchen.. 1 By Mouth Daily 6)  Amoxicillin   Tabs (Amoxicillin Tabs) .... 2 Grams 1 Hour Prior To Dental Appointment (Prophylaxis) 7)  Eq Saline Nasal Spray   Soln (Saline Soln) .... Prn 8)  Aleve 220 Mg Tabs (Naproxen Sodium) .... As Needed Pain 9)  Claritin 10 Mg Tabs (Loratadine) .Marland Kitchen.. 1 As Needed For Allergies By Mouth 10)  Hydrochlorothiazide 12.5 Mg Tabs (Hydrochlorothiazide) .... One Tab By  Mouth in Am 11)  Lisinopril 2.5 Mg Tabs (Lisinopril) .... 1/2 Tab By Mouth in Am.  Allergies: No Known Drug Allergies  Physical Exam  General:  Well-developed,well-nourished,in no acute distress; alert,appropriate and cooperative throughout examination, congested and hoarse. Head:  Normocephalic and atraumatic without obvious abnormalities. No apparent alopecia or balding. Sinuses NT. Eyes:  Conjunctiva clear bilaterally.  Ears:  External ear exam shows no significant lesions or deformities.  Otoscopic examination reveals clear canals, tympanic membranes are intact bilaterally without bulging, retraction, inflammation or discharge. Hearing is grossly normal bilaterally. Mild cerumen. Nose:  External nasal examination shows no deformity or inflammation. Nasal mucosa are pink and moist without lesions or exudates. Mouth:  Oral mucosa and oropharynx without lesions or exudates.  Teeth in good repair. Mild thick gray PND on the posterior pharynx. Neck:  No deformities, masses, or tenderness noted. Lungs:  Normal respiratory effort, chest expands symmetrically. Lungs are clear to auscultation, no crackles or wheezes but mild ant midchest ronchi, almost resolved.Marland Kitchen Heart:  Normal rate and regular rhythm. S1 and S2 normal without gallop, murmur, click, rub or other extra sounds. Extremities:  L elbow tender over the lat epicondyle. Med ok.   Impression & Recommendations:  Problem # 1:  HYPERTENSION (ICD-401.9) Assessment Improved Still slightly high. Increase to 2.5 mg, whole pill. The following medications were removed from  the medication list:    Hydrochlorothiazide 12.5 Mg Tabs (Hydrochlorothiazide) ..... One tab by mouth in am Her updated medication list for this problem includes:    Atenolol 25 Mg Tabs (Atenolol) .Marland Kitchen... 1/2 by mouth once daily    Lisinopril 2.5 Mg Tabs (Lisinopril) .Marland Kitchen... 1/2 tab by mouth in am.  BP today: 140/72 Prior BP: 160/78 (12/13/2009)  Labs Reviewed: K+: 5.0  (06/22/2009) Creat: : 0.8 (06/22/2009)   Chol: 159 (03/30/2009)   HDL: 68.30 (03/30/2009)   LDL: 74 (03/30/2009)   TG: 83.0 (03/30/2009)  Problem # 2:  LATERAL EPICONDYLITIS, RIGHT (ICD-726.32) Assessment: New Start wearing tennis elbow strap. Discussed. Will recheck at next appt.  Complete Medication List: 1)  Synthroid 100 Mcg Tabs (Levothyroxine sodium) .Marland Kitchen.. 1 by mouth once daily 2)  Atenolol 25 Mg Tabs (Atenolol) .... 1/2 by mouth once daily 3)  Lipitor 20 Mg Tabs (Atorvastatin calcium) .Marland Kitchen.. 1 by mouth once daily 4)  Caltrate 600 Tabs (Calcium carbonate tabs) .Marland Kitchen.. 1 by mouth daily 5)  One-daily Multivitamins Tabs (Multiple vitamin) .Marland Kitchen.. 1 by mouth daily 6)  Amoxicillin Tabs (Amoxicillin tabs) .... 2 grams 1 hour prior to dental appointment (prophylaxis) 7)  Eq Saline Nasal Spray Soln (Saline soln) .... Prn 8)  Aleve 220 Mg Tabs (Naproxen sodium) .... As needed pain 9)  Claritin 10 Mg Tabs (Loratadine) .Marland Kitchen.. 1 as needed for allergies by mouth 10)  Lisinopril 2.5 Mg Tabs (Lisinopril) .... 1/2 tab by mouth in am.  Patient Instructions: 1)  Pls schedule Comp Exam as soon as able after 2/16....labs prior. 2)  Get tennis elbow strap.   Orders Added: 1)  Est. Patient Level III [04540]    Current Allergies (reviewed today): No known allergies

## 2010-03-21 NOTE — Assessment & Plan Note (Signed)
Summary: recheck bp/dlo   Vital Signs:  Patient profile:   77 year old female Weight:      149.75 pounds Temp:     97.8 degrees F oral Pulse rate:   64 / minute Pulse rhythm:   regular BP sitting:   160 / 78  (left arm) Cuff size:   regular  Vitals Entered By: Sydell Axon LPN (December 13, 2009 11:32 AM) CC: Follow-up visit, stopped taking Lisinopril back in June   History of Present Illness: Pt here for BP check. She stopped her Lisinopril in June because she was lightheaded, felt tired and dizzy. That all improved after stopping the medication.  She tapered down and doewsn't think she felt better until she was totally off the medication.  She had to take her dog to the vet today and she is falling over and tipping her head...could be brain tumor...things not looking good...has been upset about it.  Problems Prior to Update: 1)  Special Screening Malig Neoplasms Other Sites  (ICD-V76.49) 2)  Other Screening Mammogram  (ICD-V76.12) 3)  Allergy  (ICD-995.3) 4)  Palpitations  (ICD-785.1) 5)  Hearing Loss, High Frequency  (ICD-389.8) 6)  Postmenopausal Status  (ICD-627.2) 7)  Mitral Valve Prolapse  (ICD-424.0) 8)  Osteopenia  (ICD-733.90) 9)  Hypothyroidism  (ICD-244.9) 10)  Hypertension  (ICD-401.9) 11)  Hyperlipidemia  (ICD-272.4) 12)  Allergic Rhinitis  (ICD-477.9)  Medications Prior to Update: 1)  Synthroid 100 Mcg Tabs (Levothyroxine Sodium) .Marland Kitchen.. 1 By Mouth Once Daily 2)  Atenolol 25 Mg Tabs (Atenolol) .... 1/2 By Mouth Once Daily 3)  Lipitor 20 Mg Tabs (Atorvastatin Calcium) .Marland Kitchen.. 1 By Mouth Once Daily 4)  Caltrate 600   Tabs (Calcium Carbonate Tabs) .Marland Kitchen.. 1 By Mouth Daily 5)  One-Daily Multivitamins   Tabs (Multiple Vitamin) .Marland Kitchen.. 1 By Mouth Daily 6)  Amoxicillin   Tabs (Amoxicillin Tabs) .... 2 Grams 1 Hour Prior To Dental Appointment (Prophylaxis) 7)  Eq Saline Nasal Spray   Soln (Saline Soln) .... Prn 8)  Lisinopril 5 Mg Tabs (Lisinopril) .... 1/2  Tab By Mouth in  Evening 9)  Aleve 220 Mg Tabs (Naproxen Sodium) .... As Needed Pain 10)  Claritin 10 Mg Tabs (Loratadine) .Marland Kitchen.. 1 As Needed For Allergies By Mouth 11)  Hydrochlorothiazide 12.5 Mg Tabs (Hydrochlorothiazide) .... One Tab By Mouth in Am 12)  Tessalon 200 Mg Caps (Benzonatate) .... One Tab By Mouth Three Times A Day As Needed For Cough During Day. 13)  Tussionex Pennkinetic Er 8-10 Mg/34ml Lqcr (Chlorpheniramine-Hydrocodone) .... One Tsp By Mouth At Night As Needed  For Cough 14)  Amoxicillin 500 Mg Caps (Amoxicillin) .... 2 Tabs By Mouth Two Times A Day  Allergies: No Known Drug Allergies  Physical Exam  General:  Well-developed,well-nourished,in no acute distress; alert,appropriate and cooperative throughout examination, congested and hoarse. Head:  Normocephalic and atraumatic without obvious abnormalities. No apparent alopecia or balding. Sinuses NT. Eyes:  Conjunctiva clear bilaterally.  Ears:  External ear exam shows no significant lesions or deformities.  Otoscopic examination reveals clear canals, tympanic membranes are intact bilaterally without bulging, retraction, inflammation or discharge. Hearing is grossly normal bilaterally. Nose:  External nasal examination shows no deformity or inflammation. Nasal mucosa are pink and moist without lesions or exudates. Mouth:  Oral mucosa and oropharynx without lesions or exudates.  Teeth in good repair. Mild thick gray PND on the posterior pharynx. Neck:  No deformities, masses, or tenderness noted. Lungs:  Normal respiratory effort, chest expands symmetrically. Lungs are  clear to auscultation, no crackles or wheezes but mild ant midchest ronchi, almost resolved.Marland Kitchen Heart:  Normal rate and regular rhythm. S1 and S2 normal without gallop, murmur, click, rub or other extra sounds.   Impression & Recommendations:  Problem # 1:  HYPERTENSION (ICD-401.9) Assessment Deteriorated Start back on Lisinopril 2.5 mg 1/2 tab and take in AM. If BP still low  or symptomatic, stop diuretic, not Lisinopril. Recheck in 3 mos. The following medications were removed from the medication list:    Lisinopril 5 Mg Tabs (Lisinopril) .Marland Kitchen... 1/2  tab by mouth in evening Her updated medication list for this problem includes:    Atenolol 25 Mg Tabs (Atenolol) .Marland Kitchen... 1/2 by mouth once daily    Hydrochlorothiazide 12.5 Mg Tabs (Hydrochlorothiazide) ..... One tab by mouth in am    Lisinopril 2.5 Mg Tabs (Lisinopril) .Marland Kitchen... 1/2 tab by mouth in am.  BP today: 160/78 Prior BP: 160/86 (06/27/2009)  Labs Reviewed: K+: 5.0 (06/22/2009) Creat: : 0.8 (06/22/2009)   Chol: 159 (03/30/2009)   HDL: 68.30 (03/30/2009)   LDL: 74 (03/30/2009)   TG: 83.0 (03/30/2009)  Complete Medication List: 1)  Synthroid 100 Mcg Tabs (Levothyroxine sodium) .Marland Kitchen.. 1 by mouth once daily 2)  Atenolol 25 Mg Tabs (Atenolol) .... 1/2 by mouth once daily 3)  Lipitor 20 Mg Tabs (Atorvastatin calcium) .Marland Kitchen.. 1 by mouth once daily 4)  Caltrate 600 Tabs (Calcium carbonate tabs) .Marland Kitchen.. 1 by mouth daily 5)  One-daily Multivitamins Tabs (Multiple vitamin) .Marland Kitchen.. 1 by mouth daily 6)  Amoxicillin Tabs (Amoxicillin tabs) .... 2 grams 1 hour prior to dental appointment (prophylaxis) 7)  Eq Saline Nasal Spray Soln (Saline soln) .... Prn 8)  Aleve 220 Mg Tabs (Naproxen sodium) .... As needed pain 9)  Claritin 10 Mg Tabs (Loratadine) .Marland Kitchen.. 1 as needed for allergies by mouth 10)  Hydrochlorothiazide 12.5 Mg Tabs (Hydrochlorothiazide) .... One tab by mouth in am 11)  Lisinopril 2.5 Mg Tabs (Lisinopril) .... 1/2 tab by mouth in am.  Patient Instructions: 1)  RTC 3 mos for BP recheck. Prescriptions: LISINOPRIL 2.5 MG TABS (LISINOPRIL) 1/2 tab by mouth in Am.  #45 x 3   Entered and Authorized by:   Shaune Leeks MD   Signed by:   Shaune Leeks MD on 12/13/2009   Method used:   Electronically to        [id number] (retail)       [address]       [place], [place]  [address]        Ph: [phone] or [phone]       Fax: [phone]   RxID:   [id number]    Orders Added: 1)  Est. Patient Level III [36644]   Immunization History:  Influenza Immunization History:    Influenza:  historical (11/17/2009)   Immunization History:  Influenza Immunization History:    Influenza:  Historical (11/17/2009)  Current Allergies (reviewed today): No known allergies

## 2010-04-04 ENCOUNTER — Encounter (INDEPENDENT_AMBULATORY_CARE_PROVIDER_SITE_OTHER): Payer: Self-pay | Admitting: *Deleted

## 2010-04-04 ENCOUNTER — Other Ambulatory Visit: Payer: Self-pay | Admitting: Family Medicine

## 2010-04-04 ENCOUNTER — Encounter: Payer: Self-pay | Admitting: Family Medicine

## 2010-04-04 ENCOUNTER — Other Ambulatory Visit (INDEPENDENT_AMBULATORY_CARE_PROVIDER_SITE_OTHER): Payer: Medicare Other

## 2010-04-04 DIAGNOSIS — E039 Hypothyroidism, unspecified: Secondary | ICD-10-CM

## 2010-04-04 DIAGNOSIS — E785 Hyperlipidemia, unspecified: Secondary | ICD-10-CM

## 2010-04-04 DIAGNOSIS — I10 Essential (primary) hypertension: Secondary | ICD-10-CM

## 2010-04-04 DIAGNOSIS — M899 Disorder of bone, unspecified: Secondary | ICD-10-CM

## 2010-04-04 LAB — BASIC METABOLIC PANEL
Calcium: 9.9 mg/dL (ref 8.4–10.5)
GFR: 58.2 mL/min — ABNORMAL LOW (ref >60.00)
Glucose, Bld: 96 mg/dL (ref 70–99)
Potassium: 4.8 mEq/L (ref 3.5–5.1)
Sodium: 141 mEq/L (ref 135–145)

## 2010-04-04 LAB — CBC WITH DIFFERENTIAL/PLATELET
Basophils Relative: 0.6 % (ref 0.0–3.0)
HCT: 44.7 % (ref 36.0–46.0)
Hemoglobin: 15.3 g/dL — ABNORMAL HIGH (ref 12.0–15.0)
Lymphocytes Relative: 32.7 % (ref 12.0–46.0)
Lymphs Abs: 2.5 10*3/uL (ref 0.7–4.0)
Monocytes Absolute: 0.7 10*3/uL (ref 0.1–1.0)
Monocytes Relative: 8.7 % (ref 3.0–12.0)
Neutro Abs: 4.3 10*3/uL (ref 1.4–7.7)
RBC: 4.88 Mil/uL (ref 3.87–5.11)
RDW: 14.1 % (ref 11.5–14.6)
WBC: 7.8 10*3/uL (ref 4.5–10.5)

## 2010-04-04 LAB — HEPATIC FUNCTION PANEL
Albumin: 4.2 g/dL (ref 3.5–5.2)
Bilirubin, Direct: 0.2 mg/dL (ref 0.0–0.3)
Total Bilirubin: 0.9 mg/dL (ref 0.3–1.2)
Total Protein: 6.8 g/dL (ref 6.0–8.3)

## 2010-04-04 LAB — LIPID PANEL
HDL: 66.9 mg/dL (ref >39.00)
LDL Cholesterol: 88 mg/dL (ref 0–99)
Total CHOL/HDL Ratio: 3

## 2010-04-04 LAB — TSH: TSH: 0.82 u[IU]/mL (ref 0.35–5.50)

## 2010-04-05 ENCOUNTER — Other Ambulatory Visit: Payer: Self-pay | Admitting: Family Medicine

## 2010-04-05 DIAGNOSIS — Z1231 Encounter for screening mammogram for malignant neoplasm of breast: Secondary | ICD-10-CM

## 2010-04-05 LAB — CONVERTED CEMR LAB: Vit D, 25-Hydroxy: 27 ng/mL — ABNORMAL LOW (ref 30–89)

## 2010-04-11 ENCOUNTER — Encounter: Payer: Self-pay | Admitting: Family Medicine

## 2010-04-11 ENCOUNTER — Encounter (INDEPENDENT_AMBULATORY_CARE_PROVIDER_SITE_OTHER): Payer: Medicare Other | Admitting: Family Medicine

## 2010-04-11 DIAGNOSIS — Z0110 Encounter for examination of ears and hearing without abnormal findings: Secondary | ICD-10-CM

## 2010-04-11 DIAGNOSIS — Z0000 Encounter for general adult medical examination without abnormal findings: Secondary | ICD-10-CM

## 2010-04-16 NOTE — Letter (Signed)
Summary: [employer] conservation [employer] Wellness Visit Questionnaire   [employer] Annual Wellness Visit Questionnaire   Imported By: Beau Fanny 04/11/2010 16:51:43  _____________________________________________________________________  External Attachment:    Type:   Image     Comment:   External Document

## 2010-04-16 NOTE — Assessment & Plan Note (Signed)
Summary: [facility] medicare [facility] asap per dr Saidy Ormand   Vital Signs:  Patient profile:   77 year old female Weight:      151.50 pounds BMI:     26.51 Temp:     97.5 degrees F oral Pulse rate:   60 / minute Pulse rhythm:   regular BP sitting:   130 / 70  (left arm) Cuff size:   regular  Vitals Entered By: Sydell Axon LPN (April 11, 2010 10:01 AM) CC: 30 Minute checkup, sees Dr. Rosemary Holms for her GYN care 25db HL: Left  500 hz: 25db 1000 hz: No Response 2000 hz: No Response 4000 hz: No Response Right  500 hz: 25db 1000 hz: 25db 2000 hz: No Response 4000 hz: No Response    History of Present Illness: Pt here for Comp Exam. She feels well and has no new complaint. She is getting over a cold, taking nothing for it. She has also been cleaning out old things from over the garage.  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 1 By Mouth Once Daily 2)  Atenolol 25 Mg Tabs (Atenolol) .... 1/2 By Mouth Once Daily 3)  Lipitor 20 Mg Tabs (Atorvastatin Calcium) .Marland Kitchen.. 1 By Mouth Once Daily 4)  Caltrate 600   Tabs (Calcium  Carbonate Tabs) .Marland Kitchen.. 1 By Mouth Daily 5)  One-Daily Multivitamins   Tabs (Multiple Vitamin) .Marland Kitchen.. 1 By Mouth Daily 6)  Amoxicillin   Tabs (Amoxicillin Tabs) .... 2 Grams 1 Hour Prior To Dental Appointment (Prophylaxis) 7)  Eq Saline Nasal Spray   Soln (Saline Soln) .... Prn 8)  Aleve 220 Mg Tabs (Naproxen Sodium) .... As Needed Pain 9)  Claritin 10 Mg Tabs (Loratadine) .Marland Kitchen.. 1 As Needed For Allergies By Mouth 10)  Lisinopril 2.5 Mg Tabs (Lisinopril) .... 1/2 Tab By Mouth in Am.  Current Medications (verified): 1)  Synthroid 100 Mcg Tabs (Levothyroxine Sodium) .Marland Kitchen.. 1 By Mouth Once Daily 2)  Atenolol 25 Mg Tabs (Atenolol) .... 1/2 By Mouth Once Daily 3)  Lipitor 20 Mg Tabs (Atorvastatin Calcium) .Marland Kitchen.. 1 By Mouth Once Daily 4)  Caltrate 600   Tabs (Calcium Carbonate Tabs) .Marland Kitchen.. 1 By Mouth Daily 5)  One-Daily Multivitamins   Tabs (Multiple Vitamin) .Marland Kitchen.. 1 By Mouth Daily 6)  Amoxicillin   Tabs (Amoxicillin Tabs) .... 2 Grams 1 Hour Prior To Dental Appointment (Prophylaxis) 7)  Eq Saline Nasal Spray   Soln (Saline Soln) .... Prn 8)  Aleve 220 Mg Tabs (Naproxen Sodium) .... As Needed Pain 9)  Claritin 10 Mg Tabs (Loratadine) .Marland Kitchen.. 1 As  Needed For Allergies By Mouth 10)  Lisinopril 2.5 Mg Tabs (Lisinopril) .... 1/2 Tab By Mouth in Am.  Allergies (verified): 1)  ! Cleocin (Clindamycin Hcl)  Past History:  Past Medical History: Last updated: 11-Aug-2006 Allergic rhinitis Hyperlipidemia Hypertension Hypothyroidism Osteopenia  Past Surgical History: Last updated: 2006/08/11                  NSVD x 2 as child     HOS Prolonged Fever sev days (?Rheum Fever?) as child    Tonsillectomy 3/96          Echo - MVP with MR (TR) - prophylaxis 96             Flex - nml 40 cm. 03/11/02     DEXA - osteopenia                  Holter - NSR, freq. PVC's, rare colp, freq. 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 Father: Died 49 stroke with CHF, (+) HTN Mother: Died 92, ?MI with CHF, (+) HTN Siblings: 4 sibs all  died of cystic fibrosis, 3 as  infants CV:  (+) parents, PGM died 38 MI (obese) HBP:  (+) parents, ?GP's CA:  lung MGF (smoker, Breast P.Aunt died 70's Stroke:  (+) father, (+) MGM (11's)  Social History: Reviewed history from Aug 11, 2006 and no changes required. 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 Eyes:  Denies blurring, eye pain, and itching; early cataracts. ENT:  Complains of decreased hearing and ringing in ears; denies earache. CV:  Denies chest pain or discomfort, fainting, fatigue, palpitations, shortness of breath with exertion, swelling of feet, and swelling of hands; palpitations better, tingling in the feet, worse at night... Resp:  Denies cough, shortness of breath, and wheezing. GI:  Complains of constipation; denies abdominal pain, bloody stools, change in bowel habits,  dark tarry stools, diarrhea, indigestion, loss of appetite, nausea, vomiting, vomiting blood, and yellowish skin color. GU:  Denies discharge, dysuria, nocturia, and urinary frequency. MS:  Complains of low back pain and cramps; denies joint pain and muscle aches; has left epicondylitis, rare cramps. Derm:  Denies dryness, itching, and rash. Neuro:  Complains of tingling; denies numbness, poor balance, and tremors; of feet at night..  Physical Exam  General:  Well-developed,well-nourished,in no acute distress; alert,appropriate and cooperative throughout examination, mildly congested and hoarse. Head:  Normocephalic and atraumatic without obvious abnormalities. No apparent alopecia or balding. Sinuses NT. Eyes:  Conjunctiva clear bilaterally.  Ears:  External ear exam shows no significant lesions or deformities.  Otoscopic examination reveals clear canals, tympanic membranes are intact bilaterally without bulging, retraction, inflammation or discharge. Hearing is grossly normal bilaterally. Mild cerumen. Nose:  External nasal examination shows no deformity or inflammation. Nasal mucosa are pink and moist without lesions or exudates. Mouth:  Oral mucosa and oropharynx without lesions or exudates.  Teeth in good repair. Mild thick gray PND on the posterior pharynx. Neck:  No deformities, masses, or tenderness noted. Chest Wall:  No deformities, masses, or tenderness noted. Breasts:  Not done Lungs:  Normal respiratory effort, chest expands symmetrically. Lungs are clear to auscultation, no crackles or wheezes but mild ant midchest ronchi, almost resolved.Marland Kitchen Heart:  Normal rate and regular rhythm. S1 and S2 normal without gallop, murmur, click, rub or other extra sounds. Abdomen:  Bowel sounds positive,abdomen soft and non-tender without masses, organomegaly or hernias noted. Rectal:  Not done Genitalia:  Not done Msk:  No deformity or scoliosis noted of thoracic or lumbar spine.   Pulses:  R and  L carotid,radial,femoral,dorsalis pedis and posterior tibial pulses are full and equal bilaterally Extremities:  No clubbing, cyanosis, edema, or deformity noted with normal full range of motion of all joints.   Neurologic:  No cranial nerve deficits noted. Station and gait are normal. Sensory, motor and coordinative functions appear intact. Skin:  Intact without suspicious lesions or rashes Cervical Nodes:  No lymphadenopathy noted Inguinal Nodes:  No significant adenopathy Psych:  Cognition and judgment appear intact. Alert and cooperative with normal attention span and concentration. No apparent delusions, illusions, hallucinations   Impression & Recommendations:  Problem # 1:  PREVENTIVE HEALTH CARE (ICD-V70.0) Assessment Comment Only  I have personally reviewed the Medicare Annual Wellness questionnaire and have noted 1.   The patient's medical and social history 2.   Their use of alcohol, tobacco or illicit drugs 3.   Their current medications and supplements 4.   The patient's functional ability including ADL's, fall risks, home safety risks and hearing or visual             impairment. 5.   Diet and physical activities 6.   Evidence for depression or mood disorders.  Orders: [facility] -Subsequent Annual Wellness Visit [phone])  Problem # 2:  LATERAL EPICONDYLITIS, RIGHT (ICD-726.32) Assessment: Improved Doing much better, cont elbow strap.  Problem # 3:  PALPITATIONS (ICD-785.1) Assessment: Improved  Better since being on Beta Blocker, cont. Her updated medication list for this problem includes:    Atenolol 25 Mg Tabs (Atenolol) .Marland Kitchen... 1/2 by mouth once daily  Avoid caffeine, chocolate, and stimulants. Stress reduction as well as medication options discussed.   Problem # 4:  HEARING LOSS, HIGH FREQUENCY (ICD-389.8) Assessment: Deteriorated She thinks this is worsening. Will get a hearing eval when able.  Problem # 5:  OSTEOPENIA (ICD-733.90) Dexa improved over three years  in the past...add Vit D and cont Ca repl.  Her updated medication list for this problem includes:    Caltrate 600 Tabs (Calcium carbonate tabs) .Marland Kitchen... 1 by mouth daily  Vit D:27 (04/04/2010), 36 (03/23/2008)  Problem # 6:  HYPOTHYROIDISM (ICD-244.9) Assessment: Unchanged  Euthyroid on current dose. Cont. Her updated medication list for this problem includes:    Synthroid 100 Mcg Tabs (Levothyroxine sodium) .Marland Kitchen... 1 by mouth once daily  Labs Reviewed: TSH: 0.82 (04/04/2010)    Chol: 176 (04/04/2010)   HDL: 66.90 (04/04/2010)   LDL: 88 (04/04/2010)  TG: 108.0 (04/04/2010)  Problem # 7:  HYPERTENSION (ICD-401.9) Assessment: Improved Cont curr meds. Her updated medication list for this problem includes:    Atenolol 25 Mg Tabs (Atenolol) .Marland Kitchen... 1/2 by mouth once daily    Lisinopril 2.5 Mg Tabs (Lisinopril) .Marland Kitchen... 1/2 tab by mouth in am.  BP today: 130/70 Prior BP: 140/72 (03/05/2010)  Labs Reviewed: K+: 4.8 (04/04/2010) Creat: : 1.0 (04/04/2010)   Chol: 176 (04/04/2010)   HDL: 66.90 (04/04/2010)   LDL: 88 (04/04/2010)   TG: 108.0 (04/04/2010)  Problem # 8:  HYPERLIPIDEMIA (ICD-272.4) Assessment: Unchanged Adequate as is. Cont diet control. Her updated medication list for this problem includes:    Lipitor 20 Mg Tabs (Atorvastatin calcium) .Marland Kitchen... 1 by mouth once daily  Labs Reviewed: SGOT: 27 (04/04/2010)   SGPT: 19 (04/04/2010)   HDL:66.90 (04/04/2010), 68.30 (03/30/2009)  LDL:88 (04/04/2010), 74 (03/30/2009)  Chol:176 (04/04/2010), 159 (03/30/2009)  Trig:108.0 (04/04/2010), 83.0 (03/30/2009)  Complete Medication List: 1)  Synthroid 100 Mcg Tabs (Levothyroxine sodium) .Marland Kitchen.. 1 by mouth once daily 2)  Atenolol 25 Mg Tabs (Atenolol) .... 1/2 by mouth once daily 3)  Lipitor 20 Mg Tabs (Atorvastatin calcium) .Marland Kitchen.. 1 by mouth once daily 4)  Caltrate 600 Tabs (Calcium carbonate tabs) .Marland Kitchen.. 1 by mouth daily 5)  One-daily Multivitamins Tabs (Multiple vitamin) .Marland Kitchen.. 1 by mouth daily 6)   Amoxicillin Tabs (Amoxicillin tabs) .... 2 grams 1 hour prior to dental appointment (prophylaxis) 7)  Eq Saline Nasal Spray Soln (Saline soln) .... Prn 8)  Aleve 220 Mg Tabs (Naproxen sodium) .... As needed pain 9)  Claritin 10 Mg Tabs (Loratadine) .Marland Kitchen.. 1 as needed for allergies by mouth 10)  Lisinopril 2.5 Mg Tabs (Lisinopril) .... 1/2 tab by mouth in am.  Other Orders: Audiometry [phone])  Patient Instructions: 1)  Take Guaifenesin by going to [facility] or [facility] and getting MUCOUS RELIEF EXPECTORANT (400mg ), take 11/2 tabs by mouth AM and NOON. 2)  Drink lots of fluids anytime taking Guaifenesin.  3)  Use Zinc lozenges. 4)  Start B12 for feet tingling. 5)  Start Vit D 1000Iu two times a day  6)  Look into shingles shot.   Orders Added: 1)  [facility] [92552] 2)  [facility] -Subsequent Annual Wellness Visit [G0439]    Current Allergies (reviewed today): ! CLEOCIN (CLINDAMYCIN HCL)

## 2010-04-25 ENCOUNTER — Ambulatory Visit
Admission: RE | Admit: 2010-04-25 | Discharge: 2010-04-25 | Disposition: A | Discharge status: Home / Self Care | Payer: Medicare Other | Source: Ambulatory Visit | Attending: Family Medicine | Admitting: Family Medicine

## 2010-04-25 DIAGNOSIS — Z1231 Encounter for screening mammogram for malignant neoplasm of breast: Secondary | ICD-10-CM

## 2010-05-01 ENCOUNTER — Other Ambulatory Visit: Payer: Self-pay | Admitting: Family Medicine

## 2010-05-01 DIAGNOSIS — R928 Other abnormal and inconclusive findings on diagnostic imaging of breast: Secondary | ICD-10-CM

## 2010-05-07 ENCOUNTER — Ambulatory Visit
Admission: RE | Admit: 2010-05-07 | Discharge: 2010-05-07 | Disposition: A | Discharge status: Home / Self Care | Payer: Medicare Other | Source: Ambulatory Visit | Attending: Family Medicine | Admitting: Family Medicine

## 2010-05-07 ENCOUNTER — Encounter: Payer: Self-pay | Admitting: Family Medicine

## 2010-05-07 DIAGNOSIS — R928 Other abnormal and inconclusive findings on diagnostic imaging of breast: Secondary | ICD-10-CM

## 2010-05-07 LAB — HM MAMMOGRAPHY: HM Mammogram: NORMAL

## 2010-06-18 ENCOUNTER — Other Ambulatory Visit: Payer: Self-pay | Admitting: Family Medicine

## 2010-10-14 ENCOUNTER — Other Ambulatory Visit: Payer: Self-pay | Admitting: Family Medicine

## 2011-03-27 ENCOUNTER — Other Ambulatory Visit: Payer: Self-pay | Admitting: *Deleted

## 2011-03-27 DIAGNOSIS — I10 Essential (primary) hypertension: Secondary | ICD-10-CM

## 2011-03-27 DIAGNOSIS — E039 Hypothyroidism, unspecified: Secondary | ICD-10-CM

## 2011-03-27 DIAGNOSIS — M8580 Other specified disorders of bone density and structure, unspecified site: Secondary | ICD-10-CM

## 2011-03-27 NOTE — Telephone Encounter (Signed)
Patient hasn't been seen in the office in some time.  OK to RF?

## 2011-03-28 DIAGNOSIS — H2510 Age-related nuclear cataract, unspecified eye: Secondary | ICD-10-CM | POA: Diagnosis not present

## 2011-03-28 MED ORDER — SYNTHROID 100 MCG PO TABS: 100.0000 ug | ORAL_TABLET | Freq: Every day | ORAL | Status: DC

## 2011-03-28 NOTE — Telephone Encounter (Signed)
Patient advised.

## 2011-03-28 NOTE — Telephone Encounter (Signed)
Needs OV.  Labs ahead of time.  rx sent.

## 2011-04-10 ENCOUNTER — Other Ambulatory Visit: Payer: Self-pay

## 2011-04-10 MED ORDER — ATENOLOL 25 MG PO TABS: 12.5000 mg | ORAL_TABLET | Freq: Every day | ORAL | Status: DC

## 2011-04-10 MED ORDER — LISINOPRIL 2.5 MG PO TABS: 1.2500 mg | ORAL_TABLET | Freq: Every day | ORAL | Status: DC

## 2011-04-10 NOTE — Telephone Encounter (Signed)
Received fax refill request from patient's pharmacy for refill of medications.  Meds refilled x 1 only. Needs appt.

## 2011-04-14 ENCOUNTER — Other Ambulatory Visit: Payer: Self-pay | Admitting: Family Medicine

## 2011-04-14 DIAGNOSIS — Z1231 Encounter for screening mammogram for malignant neoplasm of breast: Secondary | ICD-10-CM

## 2011-04-18 ENCOUNTER — Encounter: Payer: Self-pay | Admitting: Family Medicine

## 2011-04-21 ENCOUNTER — Ambulatory Visit (INDEPENDENT_AMBULATORY_CARE_PROVIDER_SITE_OTHER): Payer: Medicare Other | Admitting: Family Medicine

## 2011-04-21 ENCOUNTER — Encounter: Payer: Self-pay | Admitting: Family Medicine

## 2011-04-21 VITALS — BP 146/68 | HR 78 | Temp 97.4°F | Wt 153.0 lb

## 2011-04-21 DIAGNOSIS — M949 Disorder of cartilage, unspecified: Secondary | ICD-10-CM

## 2011-04-21 DIAGNOSIS — E039 Hypothyroidism, unspecified: Secondary | ICD-10-CM

## 2011-04-21 DIAGNOSIS — Z1211 Encounter for screening for malignant neoplasm of colon: Secondary | ICD-10-CM | POA: Diagnosis not present

## 2011-04-21 DIAGNOSIS — M899 Disorder of bone, unspecified: Secondary | ICD-10-CM

## 2011-04-21 DIAGNOSIS — I10 Essential (primary) hypertension: Secondary | ICD-10-CM

## 2011-04-21 DIAGNOSIS — M549 Dorsalgia, unspecified: Secondary | ICD-10-CM

## 2011-04-21 DIAGNOSIS — E785 Hyperlipidemia, unspecified: Secondary | ICD-10-CM | POA: Diagnosis not present

## 2011-04-21 NOTE — Progress Notes (Signed)
L sided back pain, worse with certain movements.  No trauma.  No radicular sx.    Hypertension:    Using medication without problems or lightheadedness: yes Chest pain with exertion:no Edema:no Short of breath:no Average home BPs: controlled on home checks  Elevated Cholesterol: Using medications without problems: yes Muscle aches: no, other than back ache above Diet compliance: partial, "I need to lose a few pounds."  Discussed.  Prev was on weight watchers.   Exercise: yes Other complaints:no  Hypothyroid. No dysphagia.  No mass in neck.  No ADE, compliant with med.   Osteopenia.  D/w pt about screening.  She'll consider. We discussed.    Colon cancer screening.  D/w patient [id number] for colon cancer screening, including IFOB vs. colonoscopy.  Risks and benefits of both were discussed and patient voiced understanding.  Pt elects for: IFOB.   Meds, vitals, and allergies reviewed.   PMH and SH reviewed  ROS: See HPI.  Otherwise negative.    GEN: nad, alert and oriented HEENT: mucous membranes moist NECK: supple w/o LA CV: rrr. PULM: ctab, no inc wob ABD: soft, +bs EXT: no edema SKIN: no acute rash Back with L sided pain on facet loading and improved with stretching L spine/paraspinal muscles.  SLR neg.

## 2011-04-21 NOTE — Patient Instructions (Addendum)
Check with your insurance to see if they will cover the shingles shot. Let me know if you'd like to repeat the bone density exam. I would gently stretch your back and use a heating pad.  Come back for fasting labs. Go to the lab on the way out for the stool cards.

## 2011-04-22 DIAGNOSIS — Z1211 Encounter for screening for malignant neoplasm of colon: Secondary | ICD-10-CM | POA: Insufficient documentation

## 2011-04-22 DIAGNOSIS — M549 Dorsalgia, unspecified: Secondary | ICD-10-CM | POA: Insufficient documentation

## 2011-04-22 NOTE — Assessment & Plan Note (Signed)
Cont meds, return for labs.  D/w pt about diet.   

## 2011-04-22 NOTE — Assessment & Plan Note (Signed)
Cont meds, return for labs.

## 2011-04-22 NOTE — Assessment & Plan Note (Signed)
Cont meds, return for labs.  D/w pt about diet.

## 2011-04-22 NOTE — Assessment & Plan Note (Signed)
She'll consider dexa.  If she wouldn't want treatment, then we shouldn't scan.  D/w pt.

## 2011-04-22 NOTE — Assessment & Plan Note (Signed)
D/w patient re:options for colon cancer screening, including IFOB vs. colonoscopy.  Risks and benefits of both were discussed and patient voiced understanding.  Pt elects for:IFOB  

## 2011-04-22 NOTE — Assessment & Plan Note (Signed)
Likely strain, benign exam, stretch and f/u prn.  GI caution on nsaids.

## 2011-04-28 ENCOUNTER — Other Ambulatory Visit: Payer: Self-pay | Admitting: Family Medicine

## 2011-04-28 ENCOUNTER — Other Ambulatory Visit: Payer: Medicare Other

## 2011-04-29 ENCOUNTER — Encounter: Payer: Self-pay | Admitting: *Deleted

## 2011-04-29 DIAGNOSIS — Z1211 Encounter for screening for malignant neoplasm of colon: Secondary | ICD-10-CM | POA: Diagnosis not present

## 2011-04-29 LAB — FECAL OCCULT BLOOD, IMMUNOCHEMICAL: Fecal Occult Bld: NEGATIVE

## 2011-05-09 ENCOUNTER — Other Ambulatory Visit (INDEPENDENT_AMBULATORY_CARE_PROVIDER_SITE_OTHER): Payer: Medicare Other

## 2011-05-09 DIAGNOSIS — E039 Hypothyroidism, unspecified: Secondary | ICD-10-CM | POA: Diagnosis not present

## 2011-05-09 DIAGNOSIS — M8580 Other specified disorders of bone density and structure, unspecified site: Secondary | ICD-10-CM

## 2011-05-09 DIAGNOSIS — M949 Disorder of cartilage, unspecified: Secondary | ICD-10-CM | POA: Diagnosis not present

## 2011-05-09 DIAGNOSIS — I10 Essential (primary) hypertension: Secondary | ICD-10-CM | POA: Diagnosis not present

## 2011-05-09 DIAGNOSIS — M899 Disorder of bone, unspecified: Secondary | ICD-10-CM | POA: Diagnosis not present

## 2011-05-09 LAB — LIPID PANEL
Cholesterol: 166 mg/dL (ref 0–200)
Triglycerides: 97 mg/dL (ref 0.0–149.0)

## 2011-05-09 LAB — COMPREHENSIVE METABOLIC PANEL
Albumin: 4.3 g/dL (ref 3.5–5.2)
BUN: 20 mg/dL (ref 6–23)
CO2: 28 mEq/L (ref 19–32)
Calcium: 9.6 mg/dL (ref 8.4–10.5)
Chloride: 99 mEq/L (ref 96–112)
Creatinine, Ser: 1 mg/dL (ref 0.4–1.2)
GFR: 58.72 mL/min — ABNORMAL LOW (ref >60.00)
Potassium: 5 mEq/L (ref 3.5–5.1)

## 2011-05-10 LAB — VITAMIN D 25 HYDROXY (VIT D DEFICIENCY, FRACTURES): Vit D, 25-Hydroxy: 35 ng/mL (ref 30–89)

## 2011-05-12 ENCOUNTER — Encounter: Payer: Self-pay | Admitting: *Deleted

## 2011-05-12 ENCOUNTER — Ambulatory Visit
Admission: RE | Admit: 2011-05-12 | Discharge: 2011-05-12 | Disposition: A | Discharge status: Home / Self Care | Payer: Medicare Other | Source: Ambulatory Visit | Attending: Family Medicine | Admitting: Family Medicine

## 2011-05-12 DIAGNOSIS — Z1231 Encounter for screening mammogram for malignant neoplasm of breast: Secondary | ICD-10-CM

## 2011-05-13 ENCOUNTER — Encounter: Payer: Self-pay | Admitting: *Deleted

## 2011-05-29 ENCOUNTER — Other Ambulatory Visit: Payer: Self-pay | Admitting: Family Medicine

## 2011-06-10 ENCOUNTER — Ambulatory Visit (INDEPENDENT_AMBULATORY_CARE_PROVIDER_SITE_OTHER): Payer: Medicare Other | Admitting: Family Medicine

## 2011-06-10 ENCOUNTER — Encounter: Payer: Self-pay | Admitting: Family Medicine

## 2011-06-10 VITALS — BP 152/74 | HR 64 | Temp 97.7°F | Wt 153.0 lb

## 2011-06-10 DIAGNOSIS — M549 Dorsalgia, unspecified: Secondary | ICD-10-CM | POA: Diagnosis not present

## 2011-06-10 NOTE — Progress Notes (Signed)
She was doing better and back pain had resolved but [place].  She was backing up after a split and fell down on her buttocks.  She was able to get up but had pain bending over from that point on.  She iced it down, took aleve and use some icy hot.  No sig help so far. B lower back pain, near the SI joints.  No midline pain.  Pain with sudden movements.  No pain or minimal pain if standing or sitting still.  Occ tingling in R leg, intermittent.  No weakness.  She has trouble moving from sitting to standing, anticipating some discomfort.    Meds, vitals, and allergies reviewed.   ROS: See HPI.  Otherwise, noncontributory.  nad ncat No bruising Neg SI testing Not ttp in back on palpation No midline pain Pain with forward flexion but not ext SLR neg, int/ext rotation of hips wnl Distally NV intact

## 2011-06-10 NOTE — Patient Instructions (Signed)
I would use the knee-to-chest stretch a few times a day when laying down.  Take aleve as needed with food.  A heating pad may help.  Should gradually improve.  Take care.

## 2011-06-12 ENCOUNTER — Other Ambulatory Visit: Payer: Self-pay | Admitting: Family Medicine

## 2011-06-13 NOTE — Assessment & Plan Note (Signed)
Likely a benign strain w/o red flag sx or findings.  Anatomy d/w pt.  Would stretch and f/u prn.  Aleve with GI caution.

## 2011-09-15 ENCOUNTER — Other Ambulatory Visit: Payer: Self-pay | Admitting: Family Medicine

## 2011-09-15 NOTE — Telephone Encounter (Signed)
Sent, doesn't need repeat TSH now.

## 2011-09-15 NOTE — Telephone Encounter (Signed)
Electronic refill request.  Does pt need labs or OV?

## 2011-09-25 DIAGNOSIS — H2510 Age-related nuclear cataract, unspecified eye: Secondary | ICD-10-CM | POA: Diagnosis not present

## 2011-09-30 ENCOUNTER — Other Ambulatory Visit: Payer: Self-pay | Admitting: Family Medicine

## 2012-03-25 DIAGNOSIS — H2510 Age-related nuclear cataract, unspecified eye: Secondary | ICD-10-CM | POA: Diagnosis not present

## 2012-04-06 ENCOUNTER — Other Ambulatory Visit: Payer: Self-pay | Admitting: Family Medicine

## 2012-04-20 ENCOUNTER — Other Ambulatory Visit: Payer: Self-pay

## 2012-04-20 DIAGNOSIS — Z1231 Encounter for screening mammogram for malignant neoplasm of breast: Secondary | ICD-10-CM

## 2012-05-16 ENCOUNTER — Other Ambulatory Visit: Payer: Self-pay | Admitting: Family Medicine

## 2012-05-16 DIAGNOSIS — M8580 Other specified disorders of bone density and structure, unspecified site: Secondary | ICD-10-CM

## 2012-05-16 DIAGNOSIS — E039 Hypothyroidism, unspecified: Secondary | ICD-10-CM

## 2012-05-16 DIAGNOSIS — I10 Essential (primary) hypertension: Secondary | ICD-10-CM

## 2012-05-18 ENCOUNTER — Other Ambulatory Visit (INDEPENDENT_AMBULATORY_CARE_PROVIDER_SITE_OTHER): Payer: Medicare Other

## 2012-05-18 DIAGNOSIS — E039 Hypothyroidism, unspecified: Secondary | ICD-10-CM | POA: Diagnosis not present

## 2012-05-18 DIAGNOSIS — I10 Essential (primary) hypertension: Secondary | ICD-10-CM

## 2012-05-18 LAB — COMPREHENSIVE METABOLIC PANEL
ALT: 20 U/L (ref 0–35)
AST: 27 U/L (ref 0–37)
Albumin: 4.4 g/dL (ref 3.5–5.2)
Alkaline Phosphatase: 73 U/L (ref 39–117)
Calcium: 9.6 mg/dL (ref 8.4–10.5)
Chloride: 100 mEq/L (ref 96–112)
Potassium: 4.7 mEq/L (ref 3.5–5.1)
Sodium: 138 mEq/L (ref 135–145)
Total Protein: 7.4 g/dL (ref 6.0–8.3)

## 2012-05-18 LAB — LIPID PANEL
LDL Cholesterol: 90 mg/dL (ref 0–99)
VLDL: 16.8 mg/dL (ref 0.0–40.0)

## 2012-05-21 ENCOUNTER — Ambulatory Visit
Admission: RE | Admit: 2012-05-21 | Discharge: 2012-05-21 | Disposition: A | Discharge status: Home / Self Care | Payer: Medicare Other | Source: Ambulatory Visit

## 2012-05-21 DIAGNOSIS — Z1231 Encounter for screening mammogram for malignant neoplasm of breast: Secondary | ICD-10-CM

## 2012-05-24 ENCOUNTER — Encounter: Payer: Self-pay | Admitting: Family Medicine

## 2012-05-25 ENCOUNTER — Ambulatory Visit (INDEPENDENT_AMBULATORY_CARE_PROVIDER_SITE_OTHER): Payer: Medicare Other | Admitting: Family Medicine

## 2012-05-25 ENCOUNTER — Encounter: Payer: Self-pay | Admitting: Family Medicine

## 2012-05-25 VITALS — BP 140/70 | HR 68 | Temp 97.9°F | Ht 64.0 in | Wt 151.8 lb

## 2012-05-25 DIAGNOSIS — Z1211 Encounter for screening for malignant neoplasm of colon: Secondary | ICD-10-CM | POA: Diagnosis not present

## 2012-05-25 DIAGNOSIS — E039 Hypothyroidism, unspecified: Secondary | ICD-10-CM

## 2012-05-25 DIAGNOSIS — E785 Hyperlipidemia, unspecified: Secondary | ICD-10-CM

## 2012-05-25 DIAGNOSIS — Z0000 Encounter for general adult medical examination without abnormal findings: Secondary | ICD-10-CM | POA: Diagnosis not present

## 2012-05-25 DIAGNOSIS — I10 Essential (primary) hypertension: Secondary | ICD-10-CM | POA: Diagnosis not present

## 2012-05-25 NOTE — Patient Instructions (Addendum)
Check with your insurance to see if they will cover the shingles shot. Go to the lab on the way out.  We'll contact you with your lab report. Take care.  Glad to see you.  Call with questions.

## 2012-05-25 NOTE — Progress Notes (Signed)
I have personally reviewed the Medicare Annual Wellness questionnaire and have noted 1. The patient's medical and social history 2. Their use of alcohol, tobacco or illicit drugs 3. Their current medications and supplements 4. The patient's functional ability including ADL's, fall risks, home safety risks and hearing or visual             impairment. 5. Diet and physical activities 6. Evidence for depression or mood disorders  The patients weight, height, BMI have been recorded in the chart and visual acuity is per eye clinic.  I have made referrals, counseling and provided education to the patient based review of the above and I have provided the pt with a written personalized care plan for preventive services.  See scanned forms.  Routine anticipatory guidance given to patient.  See health maintenance. Flu 2013 Shingles d/w pt.  PNA 2004 Tetanus 2010 DXA declined.  She wouldn't want treatment, so it wouldn't be reasonable to send for the DXA.   D/w patient [id number] for colon cancer screening, including IFOB vs. colonoscopy.  Risks and benefits of both were discussed and patient voiced understanding.  Pt elects WUJ:[id number].   Breast cancer screening up to date with mammogram.  Advance directive d/w pt.  Would want her husband designated if she were incapacitated.  Cognitive function addressed- see scanned forms- and if abnormal then additional documentation follows.   She is helping care for her son who has mental illness.  She is looking into a support group.    Hypothyroid.  TSH wnl.  No neck mass or dysphagia.    Elevated Cholesterol: Using medications without problems: yes Muscle aches: no Diet compliance:yes Exercise:some  Hypertension:    Using medication without problems or lightheadedness: yes Chest pain with exertion:no Edema:no Short of breath:no  PMH and SH reviewed  Meds, vitals, and allergies reviewed.   ROS: See HPI.  Otherwise negative.    GEN: nad, alert  and oriented HEENT: mucous membranes moist NECK: supple w/o LA CV: rrr. PULM: ctab, no inc wob ABD: soft, +bs EXT: no edema SKIN: no acute rash

## 2012-05-26 DIAGNOSIS — Z0000 Encounter for general adult medical examination without abnormal findings: Secondary | ICD-10-CM | POA: Insufficient documentation

## 2012-05-26 NOTE — Assessment & Plan Note (Signed)
Controlled, continue current meds. Labs d/w pt.  

## 2012-05-26 NOTE — Assessment & Plan Note (Signed)
See scanned forms.  Routine anticipatory guidance given to patient.  See health maintenance. Flu 2013 Shingles d/w pt.  PNA 2004 Tetanus 2010 DXA declined.  She wouldn't want treatment, so it wouldn't be reasonable to send for the DXA.   D/w patient [id number] for colon cancer screening, including IFOB vs. colonoscopy.  Risks and benefits of both were discussed and patient voiced understanding.  Pt elects WUJ:WJXB.   Breast cancer screening up to date with mammogram.  Advance directive d/w pt.  Would want her husband designated if she were incapacitated.  Cognitive function addressed- see scanned forms- and if abnormal then additional documentation follows.

## 2012-05-27 ENCOUNTER — Other Ambulatory Visit: Payer: Self-pay | Admitting: Family Medicine

## 2012-05-28 ENCOUNTER — Telehealth: Payer: Self-pay | Admitting: *Deleted

## 2012-06-03 ENCOUNTER — Ambulatory Visit: Payer: Self-pay | Admitting: Ophthalmology

## 2012-06-03 DIAGNOSIS — Z01810 Encounter for preprocedural cardiovascular examination: Secondary | ICD-10-CM | POA: Diagnosis not present

## 2012-06-03 DIAGNOSIS — H2510 Age-related nuclear cataract, unspecified eye: Secondary | ICD-10-CM | POA: Diagnosis not present

## 2012-06-03 DIAGNOSIS — I10 Essential (primary) hypertension: Secondary | ICD-10-CM | POA: Diagnosis not present

## 2012-06-10 ENCOUNTER — Other Ambulatory Visit: Payer: Self-pay | Admitting: Family Medicine

## 2012-06-10 ENCOUNTER — Ambulatory Visit (INDEPENDENT_AMBULATORY_CARE_PROVIDER_SITE_OTHER)
Admission: RE | Admit: 2012-06-10 | Discharge: 2012-06-10 | Disposition: A | Discharge status: Home / Self Care | Payer: Medicare Other | Source: Ambulatory Visit | Attending: Family Medicine | Admitting: Family Medicine

## 2012-06-10 ENCOUNTER — Encounter: Payer: Self-pay | Admitting: Family Medicine

## 2012-06-10 ENCOUNTER — Ambulatory Visit (INDEPENDENT_AMBULATORY_CARE_PROVIDER_SITE_OTHER): Payer: Medicare Other | Admitting: Family Medicine

## 2012-06-10 VITALS — BP 148/60 | HR 79 | Temp 98.2°F | Wt 151.5 lb

## 2012-06-10 DIAGNOSIS — S92309A Fracture of unspecified metatarsal bone(s), unspecified foot, initial encounter for closed fracture: Secondary | ICD-10-CM

## 2012-06-10 DIAGNOSIS — M25579 Pain in unspecified ankle and joints of unspecified foot: Secondary | ICD-10-CM | POA: Diagnosis not present

## 2012-06-10 DIAGNOSIS — M25571 Pain in right ankle and joints of right foot: Secondary | ICD-10-CM

## 2012-06-10 DIAGNOSIS — S92301A Fracture of unspecified metatarsal bone(s), right foot, initial encounter for closed fracture: Secondary | ICD-10-CM

## 2012-06-10 NOTE — Patient Instructions (Addendum)
See Shirlee Limerick about your referral before you leave today. Keep the splint on for now.  If it feels too tight, you can take it off and rewrap it.  Try not to bear weight on your right foot.  Use the walker as much as possible.  Elevated your foot in the meantime to help with swelling.  Take naproxen with food twice a day for pain.  Call the eye clinic so they know what is going on. Take care.

## 2012-06-10 NOTE — Progress Notes (Signed)
She missed a step in the garage two days ago and rolled her ankle. No pain initially but then pain along the distal 4th/5th MTs when she stood up.  Pain with walking. She iced it and took aleve. Still with pain.  Has to walk on her heel to decrease the pain. No LOC with the fall.    Meds, vitals, and allergies reviewed.   ROS: See HPI.  Otherwise, noncontributory.  nad R ankle with normal rom and inspection, med and lat mal not ttp Normal DP pulse Midfoot not ttp except along the distal 5th MT Distally nv intact at the toes  xrays reviewed

## 2012-06-11 DIAGNOSIS — S92309A Fracture of unspecified metatarsal bone(s), unspecified foot, initial encounter for closed fracture: Secondary | ICD-10-CM | POA: Diagnosis not present

## 2012-06-11 DIAGNOSIS — S92353A Displaced fracture of fifth metatarsal bone, unspecified foot, initial encounter for closed fracture: Secondary | ICD-10-CM | POA: Insufficient documentation

## 2012-06-11 DIAGNOSIS — S92301A Fracture of unspecified metatarsal bone(s), right foot, initial encounter for closed fracture: Secondary | ICD-10-CM | POA: Insufficient documentation

## 2012-06-11 NOTE — Assessment & Plan Note (Signed)
D/w pt.  Splinted with posterior splint.  Tolerated well, more comfortable. She has a driver. Advised not to drive. Can use a walked to off load weight.  She has walker at home.  Will refer to ortho. She has eye surgery planned. She'll call about that. She agrees with plan.

## 2012-06-14 ENCOUNTER — Ambulatory Visit: Payer: Self-pay | Admitting: Ophthalmology

## 2012-06-14 DIAGNOSIS — I10 Essential (primary) hypertension: Secondary | ICD-10-CM | POA: Diagnosis not present

## 2012-06-14 DIAGNOSIS — K219 Gastro-esophageal reflux disease without esophagitis: Secondary | ICD-10-CM | POA: Diagnosis not present

## 2012-06-14 DIAGNOSIS — I498 Other specified cardiac arrhythmias: Secondary | ICD-10-CM | POA: Diagnosis not present

## 2012-06-14 DIAGNOSIS — R609 Edema, unspecified: Secondary | ICD-10-CM | POA: Diagnosis not present

## 2012-06-14 DIAGNOSIS — H269 Unspecified cataract: Secondary | ICD-10-CM | POA: Diagnosis not present

## 2012-06-14 DIAGNOSIS — M129 Arthropathy, unspecified: Secondary | ICD-10-CM | POA: Diagnosis not present

## 2012-06-14 DIAGNOSIS — H9190 Unspecified hearing loss, unspecified ear: Secondary | ICD-10-CM | POA: Diagnosis not present

## 2012-06-14 DIAGNOSIS — E079 Disorder of thyroid, unspecified: Secondary | ICD-10-CM | POA: Diagnosis not present

## 2012-06-14 DIAGNOSIS — H2510 Age-related nuclear cataract, unspecified eye: Secondary | ICD-10-CM | POA: Diagnosis not present

## 2012-06-14 DIAGNOSIS — Z881 Allergy status to other antibiotic agents status: Secondary | ICD-10-CM | POA: Diagnosis not present

## 2012-06-14 DIAGNOSIS — Z79899 Other long term (current) drug therapy: Secondary | ICD-10-CM | POA: Diagnosis not present

## 2012-06-14 DIAGNOSIS — E7800 Pure hypercholesterolemia, unspecified: Secondary | ICD-10-CM | POA: Diagnosis not present

## 2012-06-16 ENCOUNTER — Other Ambulatory Visit: Payer: Self-pay | Admitting: *Deleted

## 2012-06-16 MED ORDER — SYNTHROID 100 MCG PO TABS: ORAL_TABLET | ORAL | Status: DC

## 2012-06-16 MED ORDER — ATORVASTATIN CALCIUM 20 MG PO TABS: ORAL_TABLET | ORAL | Status: DC

## 2012-06-29 ENCOUNTER — Other Ambulatory Visit (INDEPENDENT_AMBULATORY_CARE_PROVIDER_SITE_OTHER): Payer: Medicare Other

## 2012-06-29 DIAGNOSIS — Z1211 Encounter for screening for malignant neoplasm of colon: Secondary | ICD-10-CM

## 2012-07-01 ENCOUNTER — Encounter: Payer: Self-pay | Admitting: *Deleted

## 2012-07-07 DIAGNOSIS — S92309A Fracture of unspecified metatarsal bone(s), unspecified foot, initial encounter for closed fracture: Secondary | ICD-10-CM | POA: Diagnosis not present

## 2012-07-08 ENCOUNTER — Other Ambulatory Visit: Payer: Self-pay | Admitting: Family Medicine

## 2012-07-14 ENCOUNTER — Other Ambulatory Visit: Payer: Self-pay | Admitting: Family Medicine

## 2012-07-30 ENCOUNTER — Encounter: Payer: Self-pay | Admitting: Family Medicine

## 2012-07-30 ENCOUNTER — Ambulatory Visit (INDEPENDENT_AMBULATORY_CARE_PROVIDER_SITE_OTHER): Payer: Medicare Other | Admitting: Family Medicine

## 2012-07-30 VITALS — BP 144/70 | HR 68 | Temp 97.7°F | Wt 153.2 lb

## 2012-07-30 DIAGNOSIS — N390 Urinary tract infection, site not specified: Secondary | ICD-10-CM | POA: Diagnosis not present

## 2012-07-30 DIAGNOSIS — R300 Dysuria: Secondary | ICD-10-CM

## 2012-07-30 LAB — POCT URINALYSIS DIPSTICK
Ketones, UA: NEGATIVE
Protein, UA: NEGATIVE
Urobilinogen, UA: NEGATIVE

## 2012-07-30 MED ORDER — SULFAMETHOXAZOLE-TRIMETHOPRIM 800-160 MG PO TABS: 1.0000 | ORAL_TABLET | Freq: Two times a day (BID) | ORAL | Status: DC

## 2012-07-30 NOTE — Assessment & Plan Note (Signed)
Septra, ucx, fluids and f/u prn. Nontoxic.

## 2012-07-30 NOTE — Progress Notes (Signed)
Dysuria:yes, pain with urination duration of symptoms: a few days abdominal pain:no fevers:no back pain:no vomiting:no U/a noted.   Drinking cranberry juice didn't make it resolve.   She is getting ready to leave town.    Her foot is improved.  She has the boot if needed.    Meds, vitals, and allergies reviewed.   ROS: See HPI.  Otherwise negative.    GEN: nad, alert and oriented CV: rrr.  PULM: ctab, no inc wob ABD: soft, +bs, suprapubic area not tender EXT: no edema SKIN: no acute rash BACK: no CVA pain

## 2012-07-30 NOTE — Patient Instructions (Addendum)
Drink plenty of water and start the antibiotics today.  We'll contact you with your lab report.  Take care.   

## 2012-08-01 LAB — URINE CULTURE: Colony Count: NO GROWTH

## 2012-08-12 DIAGNOSIS — S92309A Fracture of unspecified metatarsal bone(s), unspecified foot, initial encounter for closed fracture: Secondary | ICD-10-CM | POA: Diagnosis not present

## 2013-03-31 DIAGNOSIS — H2510 Age-related nuclear cataract, unspecified eye: Secondary | ICD-10-CM | POA: Diagnosis not present

## 2013-04-26 ENCOUNTER — Other Ambulatory Visit: Payer: Self-pay

## 2013-04-26 DIAGNOSIS — Z1231 Encounter for screening mammogram for malignant neoplasm of breast: Secondary | ICD-10-CM

## 2013-05-19 ENCOUNTER — Other Ambulatory Visit: Payer: Self-pay | Admitting: Family Medicine

## 2013-05-23 ENCOUNTER — Ambulatory Visit
Admission: RE | Admit: 2013-05-23 | Discharge: 2013-05-23 | Disposition: A | Discharge status: Home / Self Care | Payer: TRICARE For Life (TFL) | Source: Ambulatory Visit

## 2013-05-23 ENCOUNTER — Encounter: Payer: Self-pay | Admitting: *Deleted

## 2013-05-23 DIAGNOSIS — Z1231 Encounter for screening mammogram for malignant neoplasm of breast: Secondary | ICD-10-CM

## 2013-06-02 ENCOUNTER — Other Ambulatory Visit: Payer: Self-pay | Admitting: Family Medicine

## 2013-06-09 ENCOUNTER — Other Ambulatory Visit: Payer: Self-pay | Admitting: Family Medicine

## 2013-06-09 DIAGNOSIS — M899 Disorder of bone, unspecified: Secondary | ICD-10-CM

## 2013-06-09 DIAGNOSIS — E039 Hypothyroidism, unspecified: Secondary | ICD-10-CM

## 2013-06-09 DIAGNOSIS — E785 Hyperlipidemia, unspecified: Secondary | ICD-10-CM

## 2013-06-09 DIAGNOSIS — M949 Disorder of cartilage, unspecified: Secondary | ICD-10-CM

## 2013-06-20 ENCOUNTER — Other Ambulatory Visit (INDEPENDENT_AMBULATORY_CARE_PROVIDER_SITE_OTHER): Payer: Medicare Other

## 2013-06-20 DIAGNOSIS — E785 Hyperlipidemia, unspecified: Secondary | ICD-10-CM

## 2013-06-20 DIAGNOSIS — E039 Hypothyroidism, unspecified: Secondary | ICD-10-CM

## 2013-06-20 DIAGNOSIS — M899 Disorder of bone, unspecified: Secondary | ICD-10-CM

## 2013-06-20 DIAGNOSIS — M949 Disorder of cartilage, unspecified: Secondary | ICD-10-CM

## 2013-06-20 LAB — LIPID PANEL
Cholesterol: 170 mg/dL (ref 0–200)
HDL: 62.3 mg/dL (ref >39.00)
LDL Cholesterol: 90 mg/dL (ref 0–99)
TRIGLYCERIDES: 88 mg/dL (ref 0.0–149.0)
Total CHOL/HDL Ratio: 3
VLDL: 17.6 mg/dL (ref 0.0–40.0)

## 2013-06-20 LAB — COMPREHENSIVE METABOLIC PANEL
ALT: 15 U/L (ref 0–35)
AST: 23 U/L (ref 0–37)
Albumin: 4.1 g/dL (ref 3.5–5.2)
Alkaline Phosphatase: 71 U/L (ref 39–117)
BUN: 22 mg/dL (ref 6–23)
CO2: 30 mEq/L (ref 19–32)
Calcium: 9.7 mg/dL (ref 8.4–10.5)
Chloride: 104 mEq/L (ref 96–112)
Creatinine, Ser: 1 mg/dL (ref 0.4–1.2)
GFR: 59.08 mL/min — AB (ref >60.00)
Glucose, Bld: 95 mg/dL (ref 70–99)
Potassium: 4.7 mEq/L (ref 3.5–5.1)
Sodium: 142 mEq/L (ref 135–145)
Total Bilirubin: 1.1 mg/dL (ref 0.3–1.2)
Total Protein: 6.8 g/dL (ref 6.0–8.3)

## 2013-06-20 LAB — TSH: TSH: 1.58 u[IU]/mL (ref 0.35–5.50)

## 2013-06-21 ENCOUNTER — Other Ambulatory Visit: Payer: Medicare Other

## 2013-06-21 LAB — VITAMIN D 25 HYDROXY (VIT D DEFICIENCY, FRACTURES): Vit D, 25-Hydroxy: 46 ng/mL (ref 30–89)

## 2013-06-24 ENCOUNTER — Encounter: Payer: Self-pay | Admitting: Family Medicine

## 2013-06-24 ENCOUNTER — Ambulatory Visit (INDEPENDENT_AMBULATORY_CARE_PROVIDER_SITE_OTHER): Payer: Medicare Other | Admitting: Family Medicine

## 2013-06-24 VITALS — BP 142/68 | HR 58 | Temp 98.2°F | Ht 64.0 in | Wt 148.5 lb

## 2013-06-24 DIAGNOSIS — E785 Hyperlipidemia, unspecified: Secondary | ICD-10-CM

## 2013-06-24 DIAGNOSIS — E039 Hypothyroidism, unspecified: Secondary | ICD-10-CM

## 2013-06-24 DIAGNOSIS — Z6379 Other stressful life events affecting family and household: Secondary | ICD-10-CM | POA: Diagnosis not present

## 2013-06-24 DIAGNOSIS — Z789 Other specified health status: Secondary | ICD-10-CM | POA: Diagnosis not present

## 2013-06-24 DIAGNOSIS — Z1211 Encounter for screening for malignant neoplasm of colon: Secondary | ICD-10-CM

## 2013-06-24 DIAGNOSIS — Z0000 Encounter for general adult medical examination without abnormal findings: Secondary | ICD-10-CM

## 2013-06-24 DIAGNOSIS — Z9189 Other specified personal risk factors, not elsewhere classified: Secondary | ICD-10-CM

## 2013-06-24 DIAGNOSIS — Z636 Dependent relative needing care at home: Secondary | ICD-10-CM

## 2013-06-24 DIAGNOSIS — I10 Essential (primary) hypertension: Secondary | ICD-10-CM

## 2013-06-24 MED ORDER — ATORVASTATIN CALCIUM 20 MG PO TABS: 10.0000 mg | ORAL_TABLET | Freq: Every day | ORAL | Status: DC

## 2013-06-24 NOTE — Patient Instructions (Addendum)
Check with your insurance to see if they will cover the shingles shot. Rosaria Ferries will call about your referral. Go to the lab on the way out.  We'll contact you with your lab report (IFOB).   Since your lipids are fine, cut the lipitor in half. Take care.  Glad to see you.

## 2013-06-24 NOTE — Progress Notes (Signed)
Pre visit review using our clinic review tool, if applicable. No additional management support is needed unless otherwise documented below in the visit note.  I have personally reviewed the Medicare Annual Wellness questionnaire and have noted 1. The patient's medical and social history 2. Their use of alcohol, tobacco or illicit drugs 3. Their current medications and supplements 4. The patient's functional ability including ADL's, fall risks, home safety risks and hearing or visual             impairment. 5. Diet and physical activities 6. Evidence for depression or mood disorders  The patients weight, height, BMI have been recorded in the chart and visual acuity is per eye clinic.  I have made referrals, counseling and provided education to the patient based review of the above and I have provided the pt with a written personalized care plan for preventive services.  See scanned forms.  Routine anticipatory guidance given to patient.  See health maintenance. Flu 2014 Shingles d/w pt.  PNA 2004 Tetanus 2010 D/w patient [id number] for colon cancer screening, including IFOB vs. colonoscopy.  Risks and benefits of both were discussed and patient voiced understanding.  Pt elects for: IFOB. Breast cancer screening up to date.  Advance directive- husband designated if patient were incapacitated.   Cognitive function addressed- see scanned forms- and if abnormal then additional documentation follows.  DXA declined.  She wouldn't want treatment.    Hypertension:    Using medication without problems or lightheadedness: yes Chest pain with exertion:no Edema:no Short of breath:no  Elevated Cholesterol: Using medications without problems:yes Muscle aches: no Diet compliance: yes Exercise:yes She wanted to cut her statin.  This is reasonable.    Son with schizophrenia, asking about counseling.   She is safe at home.    Hypothyroidism.  TSH wnl, no ADE.  No neck mass.   PMH and SH  reviewed  Meds, vitals, and allergies reviewed.   ROS: See HPI.  Otherwise negative.    GEN: nad, alert and oriented HEENT: mucous membranes moist NECK: supple w/o LA, no TMG CV: rrr. PULM: ctab, no inc wob ABD: soft, +bs EXT: no edema SKIN: no acute rash

## 2013-06-26 ENCOUNTER — Encounter: Payer: Self-pay | Admitting: Family Medicine

## 2013-06-26 DIAGNOSIS — Z636 Dependent relative needing care at home: Secondary | ICD-10-CM | POA: Insufficient documentation

## 2013-06-26 NOTE — Assessment & Plan Note (Signed)
Controlled, she wanted to cut statin.  This is reasonable.  Cut to 10mg  of lipitor.

## 2013-06-26 NOTE — Assessment & Plan Note (Signed)
Controlled, continue current tx. D/w pt.

## 2013-06-26 NOTE — Assessment & Plan Note (Signed)
Refer.  D/w pt.  Safe at home.

## 2013-06-26 NOTE — Assessment & Plan Note (Signed)
See scanned forms.  Routine anticipatory guidance given to patient.  See health maintenance. Flu 2014 Shingles d/w pt.  PNA 2004 Tetanus 2010 D/w patient [id number] for colon cancer screening, including IFOB vs. colonoscopy.  Risks and benefits of both were discussed and patient voiced understanding.  Pt elects for: IFOB. Breast cancer screening up to date.  Advance directive- husband designated if patient were incapacitated.   Cognitive function addressed- see scanned forms- and if abnormal then additional documentation follows.  DXA declined.  She wouldn't want treatment.

## 2013-06-26 NOTE — Assessment & Plan Note (Signed)
Controlled, continue current replacement. D/w pt.

## 2013-07-19 ENCOUNTER — Ambulatory Visit (INDEPENDENT_AMBULATORY_CARE_PROVIDER_SITE_OTHER): Payer: Medicare Other | Admitting: Psychology

## 2013-07-19 DIAGNOSIS — F4323 Adjustment disorder with mixed anxiety and depressed mood: Secondary | ICD-10-CM

## 2013-10-04 ENCOUNTER — Other Ambulatory Visit (INDEPENDENT_AMBULATORY_CARE_PROVIDER_SITE_OTHER): Payer: Medicare Other

## 2013-10-04 DIAGNOSIS — Z1211 Encounter for screening for malignant neoplasm of colon: Secondary | ICD-10-CM

## 2013-10-04 LAB — FECAL OCCULT BLOOD, IMMUNOCHEMICAL: Fecal Occult Bld: NEGATIVE

## 2013-10-05 ENCOUNTER — Encounter: Payer: Self-pay | Admitting: *Deleted

## 2014-01-07 ENCOUNTER — Other Ambulatory Visit: Payer: Self-pay | Admitting: Family Medicine

## 2014-05-01 ENCOUNTER — Other Ambulatory Visit: Payer: Self-pay

## 2014-05-01 DIAGNOSIS — Z1231 Encounter for screening mammogram for malignant neoplasm of breast: Secondary | ICD-10-CM

## 2014-05-10 DIAGNOSIS — H2511 Age-related nuclear cataract, right eye: Secondary | ICD-10-CM | POA: Diagnosis not present

## 2014-05-10 DIAGNOSIS — Z961 Presence of intraocular lens: Secondary | ICD-10-CM | POA: Diagnosis not present

## 2014-05-31 ENCOUNTER — Other Ambulatory Visit: Payer: Self-pay | Admitting: Family Medicine

## 2014-06-08 ENCOUNTER — Ambulatory Visit
Admission: RE | Admit: 2014-06-08 | Discharge: 2014-06-08 | Disposition: A | Discharge status: Home / Self Care | Payer: Medicare Other | Source: Ambulatory Visit

## 2014-06-08 DIAGNOSIS — Z1231 Encounter for screening mammogram for malignant neoplasm of breast: Secondary | ICD-10-CM | POA: Diagnosis not present

## 2014-06-09 NOTE — Op Note (Signed)
PATIENT NAME:  Diane Tucker, Diane Tucker MR#:  [id number] DATE OF BIRTH:  [birth year]  DATE OF PROCEDURE:  06/14/2012  PREOPERATIVE DIAGNOSIS:  Cataract, left eye.    POSTOPERATIVE DIAGNOSIS:  Cataract, left eye.  PROCEDURE PERFORMED:  Extracapsular cataract extraction using phacoemulsification with placement of an Alcon [id number], 15.0-diopter posterior chamber lens, serial [id number].  SURGEON:  Loura Back. Ocean Schildt, MD  ASSISTANT:  None.  ANESTHESIA:  4% lidocaine and 0.75% Marcaine in a 50/50 mixture with 10 units/mL of Hylenex added, given as a peribulbar.   ANESTHESIOLOGIST:  Dr. Andree Elk.  COMPLICATIONS:  None.  ESTIMATED BLOOD LOSS:  Less than 1 ml.  DESCRIPTION OF PROCEDURE:  The patient was brought to the operating room and given a peribulbar block.  The patient was then prepped and draped in the usual fashion.  The vertical rectus muscles were imbricated using 5-0 silk sutures.  These sutures were then clamped to the sterile drapes as bridle sutures.  A limbal peritomy was performed extending two clock hours and hemostasis was obtained with cautery.  A partial thickness scleral groove was made at the surgical limbus and dissected anteriorly in a lamellar dissection using an Alcon crescent knife.  The anterior chamber was entered supero-temporally with a Superblade and through the lamellar dissection with a 2.6 mm keratome.  DisCoVisc was used to replace the aqueous and a continuous tear capsulorrhexis was carried out.  Hydrodissection and hydrodelineation were carried out with balanced salt and a 27 gauge canula.  The nucleus was rotated to confirm the effectiveness of the hydrodissection.  Phacoemulsification was carried out using a divide-and-conquer technique.  Total ultrasound time was 1 minutes and 4.3 seconds with an average power of 21.9 percent and CDE of 24.16.  Irrigation/aspiration was used to remove the residual cortex.  DisCoVisc was used to inflate the capsule and the internal  incision was enlarged to 3 mm with the crescent knife.  The intraocular lens was folded and inserted into the capsular bag using the AcrySert delivery system. Irrigation/aspiration was used to remove the residual DisCoVisc.  Miostat was injected into the anterior chamber through the paracentesis track to inflate the anterior chamber and induce miosis.  A tenth of a milliliter of cefuroxime was placed into the anterior chamber via the paracentesis tract. The wound was checked for leaks and none were found. The conjunctiva was closed with cautery and the bridle sutures were removed.  Two drops of 0.3% Vigamox were placed on the eye.   An eye shield was placed on the eye.  The patient was discharged to the recovery room in good condition. ____________________________ Loura Back Eduardo Honor, MD sad:sb D: 06/14/2012 14:06:23 ET T: 06/14/2012 14:49:16 ET JOB#: [id number]  cc: Remo Lipps A. Loranda Mastel, MD, <Dictator> Martie Lee MD ELECTRONICALLY SIGNED 06/21/2012 9:47

## 2014-06-21 ENCOUNTER — Other Ambulatory Visit: Payer: Self-pay | Admitting: Family Medicine

## 2014-06-22 ENCOUNTER — Other Ambulatory Visit: Payer: Self-pay | Admitting: Family Medicine

## 2014-06-22 ENCOUNTER — Other Ambulatory Visit (INDEPENDENT_AMBULATORY_CARE_PROVIDER_SITE_OTHER): Payer: Medicare Other

## 2014-06-22 DIAGNOSIS — E038 Other specified hypothyroidism: Secondary | ICD-10-CM

## 2014-06-22 DIAGNOSIS — Z8639 Personal history of other endocrine, nutritional and metabolic disease: Secondary | ICD-10-CM

## 2014-06-22 DIAGNOSIS — I10 Essential (primary) hypertension: Secondary | ICD-10-CM

## 2014-06-22 DIAGNOSIS — M949 Disorder of cartilage, unspecified: Secondary | ICD-10-CM

## 2014-06-22 DIAGNOSIS — M899 Disorder of bone, unspecified: Secondary | ICD-10-CM

## 2014-06-22 DIAGNOSIS — M8580 Other specified disorders of bone density and structure, unspecified site: Secondary | ICD-10-CM

## 2014-06-22 DIAGNOSIS — E785 Hyperlipidemia, unspecified: Secondary | ICD-10-CM

## 2014-06-22 LAB — LIPID PANEL
Cholesterol: 183 mg/dL (ref 0–200)
HDL: 67.3 mg/dL (ref >39.00)
LDL Cholesterol: 95 mg/dL (ref 0–99)
NonHDL: 115.7
Total CHOL/HDL Ratio: 3
Triglycerides: 104 mg/dL (ref 0.0–149.0)
VLDL: 20.8 mg/dL (ref 0.0–40.0)

## 2014-06-22 LAB — COMPREHENSIVE METABOLIC PANEL
ALK PHOS: 68 U/L (ref 39–117)
ALT: 17 U/L (ref 0–35)
AST: 25 U/L (ref 0–37)
Albumin: 4.1 g/dL (ref 3.5–5.2)
BUN: 17 mg/dL (ref 6–23)
CALCIUM: 9.8 mg/dL (ref 8.4–10.5)
CO2: 31 mEq/L (ref 19–32)
CREATININE: 1.01 mg/dL (ref 0.40–1.20)
Chloride: 102 mEq/L (ref 96–112)
GFR: 56.24 mL/min — ABNORMAL LOW (ref >60.00)
Glucose, Bld: 95 mg/dL (ref 70–99)
Potassium: 4.5 mEq/L (ref 3.5–5.1)
Sodium: 139 mEq/L (ref 135–145)
Total Bilirubin: 0.7 mg/dL (ref 0.2–1.2)
Total Protein: 7.1 g/dL (ref 6.0–8.3)

## 2014-06-22 LAB — VITAMIN D 25 HYDROXY (VIT D DEFICIENCY, FRACTURES): VITD: 22.47 ng/mL — ABNORMAL LOW (ref 30.00–100.00)

## 2014-06-22 LAB — TSH: TSH: 0.83 u[IU]/mL (ref 0.35–4.50)

## 2014-06-29 ENCOUNTER — Encounter: Payer: Self-pay | Admitting: Family Medicine

## 2014-06-29 ENCOUNTER — Ambulatory Visit (INDEPENDENT_AMBULATORY_CARE_PROVIDER_SITE_OTHER): Payer: Medicare Other | Admitting: Family Medicine

## 2014-06-29 VITALS — BP 132/70 | HR 68 | Temp 98.4°F | Ht 64.0 in | Wt 150.5 lb

## 2014-06-29 DIAGNOSIS — I10 Essential (primary) hypertension: Secondary | ICD-10-CM | POA: Diagnosis not present

## 2014-06-29 DIAGNOSIS — M19049 Primary osteoarthritis, unspecified hand: Secondary | ICD-10-CM

## 2014-06-29 DIAGNOSIS — Z23 Encounter for immunization: Secondary | ICD-10-CM

## 2014-06-29 DIAGNOSIS — E038 Other specified hypothyroidism: Secondary | ICD-10-CM

## 2014-06-29 DIAGNOSIS — E559 Vitamin D deficiency, unspecified: Secondary | ICD-10-CM | POA: Insufficient documentation

## 2014-06-29 DIAGNOSIS — Z1211 Encounter for screening for malignant neoplasm of colon: Secondary | ICD-10-CM

## 2014-06-29 DIAGNOSIS — E785 Hyperlipidemia, unspecified: Secondary | ICD-10-CM | POA: Diagnosis not present

## 2014-06-29 DIAGNOSIS — Z0000 Encounter for general adult medical examination without abnormal findings: Secondary | ICD-10-CM

## 2014-06-29 MED ORDER — SYNTHROID 100 MCG PO TABS: 100.0000 ug | ORAL_TABLET | Freq: Every day | ORAL | Status: DC

## 2014-06-29 MED ORDER — ATENOLOL 25 MG PO TABS: 12.5000 mg | ORAL_TABLET | Freq: Every day | ORAL | Status: DC

## 2014-06-29 MED ORDER — ATORVASTATIN CALCIUM 20 MG PO TABS: 10.0000 mg | ORAL_TABLET | Freq: Every day | ORAL | Status: DC

## 2014-06-29 NOTE — Assessment & Plan Note (Signed)
Flu 2015 Shingles d/w pt.  PNA 2004 Tetanus 2010 D/w patient [id number] for colon cancer screening, including IFOB vs. colonoscopy.  Risks and benefits of both were discussed and patient voiced understanding.  Pt elects HUO:HFGB.  Breast cancer screening- mammogram up to date.   DXA declined by patient.  D/w pt about pros and cons.  Advance directive- husband designated if patient were incapacitated.  Cognitive function addressed- see scanned forms- and if abnormal then additional documentation follows.

## 2014-06-29 NOTE — Progress Notes (Signed)
Pre visit review using our clinic review tool, if applicable. No additional management support is needed unless otherwise documented below in the visit note.  I have personally reviewed the Medicare Annual Wellness questionnaire and have noted 1. The patient's medical and social history 2. Their use of alcohol, tobacco or illicit drugs 3. Their current medications and supplements 4. The patient's functional ability including ADL's, fall risks, home safety risks and hearing or visual             impairment. 5. Diet and physical activities 6. Evidence for depression or mood disorders  The patients weight, height, BMI have been recorded in the chart and visual acuity is per eye clinic.  I have made referrals, counseling and provided education to the patient based review of the above and I have provided the pt with a written personalized care plan for preventive services.  Provider list updated- see scanned forms.  Routine anticipatory guidance given to patient.  See health maintenance.  Flu 2015 Shingles d/w pt.  PNA 2004 Tetanus 2010 D/w patient [id number] for colon cancer screening, including IFOB vs. colonoscopy.  Risks and benefits of both were discussed and patient voiced understanding.  Pt elects XBW:IOMB.  Breast cancer screening- mammogram up to date.   DXA declined by patient.  D/w pt about pros and cons.  Advance directive- husband designated if patient were incapacitated.  Cognitive function addressed- see scanned forms- and if abnormal then additional documentation follows.   Elevated Cholesterol: Using medications without problems:yes Muscle aches: no Diet compliance:yes Exercise:yes Labs d/w pt.  Reasonable control with diet/exercise/statin.   Hypertension:    Using medication without problems or lightheadedness: yes Chest pain with exertion:no Edema: occ BLE edema, limited.  Short of breath:no Average home BPs: similar to recheck today.   Hypothyroidism.  No neck  mass, dysphagia, ADE on med.  TSH wnl.  D/w pt.   Low vit D.  She had been off replacement.  She'll restart.  D/w pt.    She has had some R 3rd DIP joint changes w/o pain.  It is interfering with bowling, where she has trouble getting her finger down in the hole of the bowling ball.  She is considering getting the bowling ball drilled out to be roomier.  No trauma.  Still with ROM intact.   PMH and SH reviewed  Meds, vitals, and allergies reviewed.   ROS: See HPI.  Otherwise negative.    GEN: nad, alert and oriented HEENT: mucous membranes moist NECK: supple w/o LA, no tmg CV: rrr. PULM: ctab, no inc wob ABD: soft, +bs EXT: no edema SKIN: no acute rash She has mild chronic changes and mild puffiness at the R 3rd DIP but she isn't ttp and the joint isn't red.

## 2014-06-29 NOTE — Assessment & Plan Note (Signed)
Controlled, continue current med, diet, exercise. D/w pt.

## 2014-06-29 NOTE — Assessment & Plan Note (Signed)
No neck mass, dysphagia, ADE on med.  TSH wnl.  D/w pt.  Continue as is.

## 2014-06-29 NOTE — Assessment & Plan Note (Signed)
Likely, but fortunately no pain.  She'll update me as needed.

## 2014-06-29 NOTE — Patient Instructions (Addendum)
Check with your insurance to see if they will cover the shingles shot. Go to the lab on the way out.  We'll contact you with your lab report. Take care. Glad to see you.  Recheck in about 1 year, labs ahead of time.

## 2014-06-29 NOTE — Assessment & Plan Note (Signed)
Low vit D noted on labs. She had been off replacement. She'll restart. D/w pt.  She agrees.

## 2014-08-28 ENCOUNTER — Other Ambulatory Visit: Payer: Self-pay | Admitting: Family Medicine

## 2014-09-07 ENCOUNTER — Other Ambulatory Visit (INDEPENDENT_AMBULATORY_CARE_PROVIDER_SITE_OTHER): Payer: Medicare Other

## 2014-09-07 DIAGNOSIS — Z1211 Encounter for screening for malignant neoplasm of colon: Secondary | ICD-10-CM

## 2014-09-07 LAB — FECAL OCCULT BLOOD, IMMUNOCHEMICAL: Fecal Occult Bld: NEGATIVE

## 2014-09-08 ENCOUNTER — Encounter: Payer: Self-pay | Admitting: *Deleted

## 2014-11-22 DIAGNOSIS — Z23 Encounter for immunization: Secondary | ICD-10-CM | POA: Diagnosis not present

## 2015-05-14 ENCOUNTER — Encounter: Payer: Self-pay | Admitting: Family Medicine

## 2015-05-14 ENCOUNTER — Ambulatory Visit (INDEPENDENT_AMBULATORY_CARE_PROVIDER_SITE_OTHER): Payer: Medicare Other | Admitting: Family Medicine

## 2015-05-14 VITALS — BP 122/84 | HR 69 | Temp 98.1°F | Ht 64.0 in | Wt 149.0 lb

## 2015-05-14 DIAGNOSIS — H6121 Impacted cerumen, right ear: Secondary | ICD-10-CM

## 2015-05-14 DIAGNOSIS — H6120 Impacted cerumen, unspecified ear: Secondary | ICD-10-CM | POA: Insufficient documentation

## 2015-05-14 NOTE — Assessment & Plan Note (Addendum)
New problem. Resolved following irrigation. Advised use of OTC Debrox to prevent recurrence.

## 2015-05-14 NOTE — Progress Notes (Signed)
   Subjective:  Patient ID: Diane Tucker, female    DOB: [birth year]  Age: 82 y.o. MRN: [id number]  CC: Ear clogged  HPI:  82 year old female presents to clinic today with complaints that her right ear is stopped up.  Patient states that this occurred on Friday. She reports that she had difficulty hearing out of the right ear and that it felt "stopped up". No known exacerbating or relieving factors, except for the fact that she wears hearing aids. No other associated symptoms. No other complaints at this time.  Social Hx   Social History   Social History  . Marital Status: Married    Spouse Name: N/A  . Number of Children: 2  . Years of Education: N/A   Occupational History  . Public affairs consultant in past   . Income Tax, part-time    Social History Main Topics  . Smoking status: Never Smoker   . Smokeless tobacco: None  . Alcohol Use: Yes     Comment: Occasional wine  . Drug Use: No  . Sexual Activity: Not Asked   Other Topics Concern  . None   Social History Narrative   Married 1964, lives with husband, 2 adult children (one may have schizophrenia)   Former Engineer, building services, retired from tax work   Enjoys Recruitment consultant   Review of Systems  Constitutional: Negative.   HENT: Positive for hearing loss.    Objective:  BP 122/84 mmHg  Pulse 69  Temp(Src) 98.1 F (36.7 C) (Oral)  Ht 5\' 4"  (1.626 m)  Wt 149 lb (67.586 kg)  BMI 25.56 kg/m2  SpO2 97%  BP/Weight 05/14/2015 Q000111Q [id number]  Systolic BP 123XX123 Q000111Q [id number]  Diastolic BP 84 70 68  Wt. (Lbs) 149 150.5 148.5  BMI 25.56 25.82 25.48   Physical Exam  Constitutional: She appears well-developed. No distress.  HENT:  Head: Normocephalic and atraumatic.  Right TM initially obscured by cerumen. Once removed TM normal.  Left TM normal.   Cardiovascular: Normal rate and regular rhythm.   Murmur heard. Pulmonary/Chest: Effort normal.  Neurological: She is alert.  Vitals reviewed.  Lab Results    Component Value Date   WBC 7.8 04/04/2010   HGB 15.3* 04/04/2010   HCT 44.7 04/04/2010   PLT 254.0 04/04/2010   GLUCOSE 95 06/22/2014   CHOL 183 06/22/2014   TRIG 104.0 06/22/2014   HDL 67.30 06/22/2014   LDLCALC 95 06/22/2014   ALT 17 06/22/2014   AST 25 06/22/2014   NA 139 06/22/2014   K 4.5 06/22/2014   CL 102 06/22/2014   CREATININE 1.01 06/22/2014   BUN 17 06/22/2014   CO2 31 06/22/2014   TSH 0.83 06/22/2014   Assessment & Plan:   Problem List Items Addressed This Visit    Cerumen impaction - Primary    New problem. Resolved following irrigation. Advised use of Debrox to prevent recurrence.        Follow-up: PRN  [place]

## 2015-05-14 NOTE — Progress Notes (Signed)
Pre visit review using our clinic review tool, if applicable. No additional management support is needed unless otherwise documented below in the visit note. 

## 2015-05-14 NOTE — Patient Instructions (Signed)
Use over the counter Debrox 1-2 times a week to prevent this from occurring again.  Take care  Dr. Lacinda Axon   Cerumen Impaction The structures of the external ear canal secrete a waxy substance known as cerumen. Excess cerumen can build up in the ear canal, causing a condition known as cerumen impaction. Cerumen impaction can cause ear pain and disrupt the function of the ear. The rate of cerumen production differs for each individual. In certain individuals, the configuration of the ear canal may decrease his or her ability to naturally remove cerumen. CAUSES Cerumen impaction is caused by excessive cerumen production or buildup. RISK FACTORS  Frequent use of swabs to clean ears.  Having narrow ear canals.  Having eczema.  Being dehydrated. SIGNS AND SYMPTOMS  Diminished hearing.  Ear drainage.  Ear pain.  Ear itch. TREATMENT Treatment may involve:  Over-the-counter or prescription ear drops to soften the cerumen.  Removal of cerumen by a health care provider. This may be done with:  Irrigation with warm water. This is the most common method of removal.  Ear curettes and other instruments.  Surgery. This may be done in severe cases. HOME CARE INSTRUCTIONS  Take medicines only as directed by your health care provider.  Do not insert objects into the ear with the intent of cleaning the ear. PREVENTION  Do not insert objects into the ear, even with the intent of cleaning the ear. Removing cerumen as a part of normal hygiene is not necessary, and the use of swabs in the ear canal is not recommended.  Drink enough water to keep your urine clear or pale yellow.  Control your eczema if you have it. SEEK MEDICAL CARE IF:  You develop ear pain.  You develop bleeding from the ear.  The cerumen does not clear after you use ear drops as directed.   This information is not intended to replace advice given to you by your health care provider. Make sure you discuss any  questions you have with your health care provider.   Document Released: 03/13/2004 Document Revised: 02/24/2014 Document Reviewed: 09/20/2014 Elsevier Interactive Patient Education [employer].

## 2015-05-16 DIAGNOSIS — H26492 Other secondary cataract, left eye: Secondary | ICD-10-CM | POA: Diagnosis not present

## 2015-05-23 DIAGNOSIS — H26492 Other secondary cataract, left eye: Secondary | ICD-10-CM | POA: Diagnosis not present

## 2015-05-24 ENCOUNTER — Other Ambulatory Visit: Payer: Self-pay

## 2015-05-24 DIAGNOSIS — Z1231 Encounter for screening mammogram for malignant neoplasm of breast: Secondary | ICD-10-CM

## 2015-06-19 ENCOUNTER — Ambulatory Visit
Admission: RE | Admit: 2015-06-19 | Discharge: 2015-06-19 | Disposition: A | Discharge status: Home / Self Care | Payer: Medicare Other | Source: Ambulatory Visit

## 2015-06-19 DIAGNOSIS — Z1231 Encounter for screening mammogram for malignant neoplasm of breast: Secondary | ICD-10-CM | POA: Diagnosis not present

## 2015-06-21 ENCOUNTER — Encounter: Payer: Self-pay | Admitting: *Deleted

## 2015-06-25 ENCOUNTER — Other Ambulatory Visit: Payer: Self-pay | Admitting: Family Medicine

## 2015-06-25 DIAGNOSIS — E559 Vitamin D deficiency, unspecified: Secondary | ICD-10-CM

## 2015-06-25 DIAGNOSIS — I10 Essential (primary) hypertension: Secondary | ICD-10-CM

## 2015-06-28 ENCOUNTER — Ambulatory Visit (INDEPENDENT_AMBULATORY_CARE_PROVIDER_SITE_OTHER): Payer: Medicare Other

## 2015-06-28 ENCOUNTER — Other Ambulatory Visit (INDEPENDENT_AMBULATORY_CARE_PROVIDER_SITE_OTHER): Payer: Medicare Other

## 2015-06-28 VITALS — BP 120/70 | HR 64 | Temp 97.4°F | Ht 62.5 in | Wt 147.5 lb

## 2015-06-28 DIAGNOSIS — Z0000 Encounter for general adult medical examination without abnormal findings: Secondary | ICD-10-CM | POA: Diagnosis not present

## 2015-06-28 DIAGNOSIS — E559 Vitamin D deficiency, unspecified: Secondary | ICD-10-CM | POA: Diagnosis not present

## 2015-06-28 DIAGNOSIS — I10 Essential (primary) hypertension: Secondary | ICD-10-CM

## 2015-06-28 LAB — COMPREHENSIVE METABOLIC PANEL
ALBUMIN: 4.2 g/dL (ref 3.5–5.2)
ALK PHOS: 62 U/L (ref 39–117)
ALT: 17 U/L (ref 0–35)
AST: 26 U/L (ref 0–37)
BUN: 22 mg/dL (ref 6–23)
CO2: 30 mEq/L (ref 19–32)
CREATININE: 0.91 mg/dL (ref 0.40–1.20)
Calcium: 9.7 mg/dL (ref 8.4–10.5)
Chloride: 101 mEq/L (ref 96–112)
GFR: 63.27 mL/min (ref >60.00)
Glucose, Bld: 96 mg/dL (ref 70–99)
Potassium: 4.3 mEq/L (ref 3.5–5.1)
SODIUM: 138 meq/L (ref 135–145)
TOTAL PROTEIN: 6.9 g/dL (ref 6.0–8.3)
Total Bilirubin: 0.6 mg/dL (ref 0.2–1.2)

## 2015-06-28 LAB — LIPID PANEL
CHOLESTEROL: 177 mg/dL (ref 0–200)
HDL: 54.7 mg/dL (ref >39.00)
LDL Cholesterol: 105 mg/dL — ABNORMAL HIGH (ref 0–99)
NonHDL: 121.92
Total CHOL/HDL Ratio: 3
Triglycerides: 87 mg/dL (ref 0.0–149.0)
VLDL: 17.4 mg/dL (ref 0.0–40.0)

## 2015-06-28 LAB — TSH: TSH: 1.18 u[IU]/mL (ref 0.35–4.50)

## 2015-06-28 LAB — VITAMIN D 25 HYDROXY (VIT D DEFICIENCY, FRACTURES): VITD: 26.61 ng/mL — AB (ref 30.00–100.00)

## 2015-06-28 NOTE — Patient Instructions (Signed)
Diane Tucker , Thank you for taking time to come for your Medicare Wellness Visit. I appreciate your ongoing commitment to your health goals. Please review the following plan we discussed and let me know if I can assist you in the future.   These are the goals we discussed: Goals    . Increase physical activity     Starting 06/28/15, I will continue to exercise at least 15 min 3 days per week.        This is a list of the screening recommended for you and due dates:  Health Maintenance  Topic Date Due  . Shingles Vaccine  06/27/2016*  . Flu Shot  09/18/2015  . Mammogram  06/18/2016  . Tetanus Vaccine  03/28/2018  . DEXA scan (bone density measurement)  Completed  . Pneumonia vaccines  Completed  *Topic was postponed. The date shown is not the original due date.   Preventive Care for Adults  A healthy lifestyle and preventive care can promote health and wellness. Preventive health guidelines for adults include the following key practices.  . A routine yearly physical is a good way to check with your health care provider about your health and preventive screening. It is a chance to share any concerns and updates on your health and to receive a thorough exam.  . Visit your dentist for a routine exam and preventive care every 6 months. Brush your teeth twice a day and floss once a day. Good oral hygiene prevents tooth decay and gum disease.  . The frequency of eye exams is based on your age, health, family medical history, use  of contact lenses, and other factors. Follow your health care provider's ecommendations for frequency of eye exams.  . Eat a healthy diet. Foods like vegetables, fruits, whole grains, low-fat dairy products, and lean protein foods contain the nutrients you need without too many calories. Decrease your intake of foods high in solid fats, added sugars, and salt. Eat the right amount of calories for you. Get information about a proper diet from your health care  provider, if necessary.  . Regular physical exercise is one of the most important things you can do for your health. Most adults should get at least 150 minutes of moderate-intensity exercise (any activity that increases your heart rate and causes you to sweat) each week. In addition, most adults need muscle-strengthening exercises on 2 or more days a week.  Silver Sneakers may be a benefit available to you. To determine eligibility, you may visit the website: www.silversneakers.com or contact program at 534 531 0990 Mon-Fri between 8AM-8PM.   . Maintain a healthy weight. The body mass index (BMI) is a screening tool to identify possible weight problems. It provides an estimate of body fat based on height and weight. Your health care provider can find your BMI and can help you achieve or maintain a healthy weight.   For adults 20 years and older: ? A BMI below 18.5 is considered underweight. ? A BMI of 18.5 to 24.9 is normal. ? A BMI of 25 to 29.9 is considered overweight. ? A BMI of 30 and above is considered obese.   . Maintain normal blood lipids and cholesterol levels by exercising and minimizing your intake of saturated fat. Eat a balanced diet with plenty of fruit and vegetables. Blood tests for lipids and cholesterol should begin at age 36 and be repeated every 5 years. If your lipid or cholesterol levels are high, you are over 50, or you are  at high risk for heart disease, you may need your cholesterol levels checked more frequently. Ongoing high lipid and cholesterol levels should be treated with medicines if diet and exercise are not working.  . If you smoke, find out from your health care provider how to quit. If you do not use tobacco, please do not start.  . If you choose to drink alcohol, please do not consume more than 2 drinks per day. One drink is considered to be 12 ounces (355 mL) of beer, 5 ounces (148 mL) of wine, or 1.5 ounces (44 mL) of liquor.  . If you are 24-79 years  old, ask your health care provider if you should take aspirin to prevent strokes.  . Use sunscreen. Apply sunscreen liberally and repeatedly throughout the day. You should seek shade when your shadow is shorter than you. Protect yourself by wearing long sleeves, pants, a wide-brimmed hat, and sunglasses year round, whenever you are outdoors.  . Once a month, do a whole body skin exam, using a mirror to look at the skin on your back. Tell your health care provider of new moles, moles that have irregular borders, moles that are larger than a pencil eraser, or moles that have changed in shape or color.

## 2015-06-28 NOTE — Progress Notes (Signed)
PCP notes:  Health maintenance: Pt wants to discuss shingles vaccine with PCP at next appt.   Screenings: Hearing: wears bilateral hearing aids. Vision: last eye exam in 2017. Cognitive: Mini-cog completed. No areas of concern noted. Depression: negative.

## 2015-06-28 NOTE — Progress Notes (Signed)
Pre visit review using our clinic review tool, if applicable. No additional management support is needed unless otherwise documented below in the visit note. 

## 2015-06-28 NOTE — Progress Notes (Signed)
   Subjective:    Patient ID: Diane Tucker, female    DOB: Jan 17, 1934, 82 y.o.   MRN: [id number]  HPI  I reviewed health advisor's note, was available for consultation, and agree with documentation and plan.    Review of Systems     Objective:   Physical Exam        Assessment & Plan:

## 2015-06-28 NOTE — Progress Notes (Signed)
Subjective:   Diane Tucker is a 82 y.o. female who presents for Medicare Annual (Subsequent) preventive examination.  Review of Systems:  N/A Cardiac Risk Factors include: advanced age (>64men, >80 women);dyslipidemia;hypertension     Objective:     Vitals: BP 120/70 mmHg  Pulse 64  Temp(Src) 97.4 F (36.3 C) (Oral)  Ht 5' 2.5" (1.588 m)  Wt 147 lb 8 oz (66.906 kg)  BMI 26.53 kg/m2  SpO2 97%  Body mass index is 26.53 kg/(m^2).   Tobacco History  Smoking status  . Never Smoker   Smokeless tobacco  . Not on file     Counseling given: Not Answered   Past Medical History  Diagnosis Date  . Allergy   . Hyperlipidemia   . Hypertension   . Thyroid disease     Hypothyroidism  . Osteopenia     never on treatment as of 2013  . Murmur    Past Surgical History  Procedure Laterality Date  . Tonsillectomy      As a child  . Rheumatic fever      (?) as a child. Hosp, prolonged fever for several days.  . Nsvd      X 2  . Doppler echocardiography  3/96    MVP with MR (TR) - prophylaxis  . Holter monitor      NSR, freq. PVC's, rare colp, freq. PAC's   Family History  Problem Relation Age of Onset  . Hypertension Mother   . Heart disease Mother     CHF, MI  . Stroke Father   . Heart disease Father     CHF  . Hypertension Father   . Cancer Paternal Aunt 46    Breast  . Breast cancer Paternal Aunt   . Stroke Maternal Grandmother 80  . Cancer Maternal Grandfather     Lung (smoker)  . Heart disease Paternal Grandmother     MI  . Obesity Paternal Grandmother   . Cystic fibrosis Other     3 died as infants  . Hypertension Other   . Colon cancer Neg Hx    History  Sexual Activity  . Sexual Activity: No    Outpatient Encounter Prescriptions as of 06/28/2015  Medication Sig  . amoxicillin (AMOXIL) 500 MG tablet 2 grams prior to dental appointments.  Marland Kitchen atenolol (TENORMIN) 25 MG tablet Take 0.5 tablets (12.5 mg total) by mouth daily.  Marland Kitchen atorvastatin  (LIPITOR) 20 MG tablet Take 0.5 tablets (10 mg total) by mouth daily.  . Coenzyme Q10 (COQ10) 200 MG CAPS Take 1 capsule by mouth daily.  . hydroxypropyl methylcellulose (ISOPTO TEARS) 2.5 % ophthalmic solution 1 drop 3 (three) times daily as needed.  . loratadine (CLARITIN) 10 MG tablet Take 10 mg by mouth daily as needed for allergies.  . Multiple Vitamin (MULTIVITAMIN) tablet Take 1 tablet by mouth daily.  . naproxen sodium (ANAPROX) 220 MG tablet Take 220 mg by mouth as needed.  Marland Kitchen oxymetazoline (AFRIN) 0.05 % nasal spray Place 2 sprays into the nose 2 (two) times daily as needed for congestion.  . simethicone (MYLICON) 0000000 MG chewable tablet Chew 125 mg by mouth every 6 (six) hours as needed for flatulence.  . sodium chloride (OCEAN) 0.65 % nasal spray Place 1 spray into the nose as needed.  Marland Kitchen SYNTHROID 100 MCG tablet Take 1 tablet (100 mcg total) by mouth daily.   No facility-administered encounter medications on file as of 06/28/2015.    Activities of Daily Living  In your present state of health, do you have any difficulty performing the following activities: 06/28/2015  Hearing? Y  Vision? N  Difficulty concentrating or making decisions? Y  Walking or climbing stairs? N  Dressing or bathing? N  Doing errands, shopping? N  Preparing Food and eating ? N  Using the Toilet? N  In the past six months, have you accidently leaked urine? Y  Do you have problems with loss of bowel control? N  Managing your Medications? N  Managing your Finances? N  Housekeeping or managing your Housekeeping? N    Patient Care Team: Tonia Ghent, MD as PCP - General (Family Medicine) Estill Cotta, MD as Consulting Physician (Ophthalmology) Oneta Rack, MD as Consulting Physician (Dermatology) Kipp Laurence, MD as Referring Physician (Audiology) Sherwood Gambler, DDS as Consulting Physician (Dentistry)    Assessment:     Hearing Screening Comments: Wears bilateral hearing aids Vision  Screening Comments: Last eye exam in April 2017  Exercise Activities and Dietary recommendations Current Exercise Habits: Home exercise routine, Type of exercise: walking;Other - see comments (bowling, kayaking), Time (Minutes): 30, Intensity: Moderate, Exercise limited by: None identified  Goals    . Increase physical activity     Starting 06/28/15, I will continue to exercise at least 15 min 3 days per week.       Fall Risk Fall Risk  06/28/2015 06/29/2014 06/24/2013 05/25/2012  Falls in the past year? No No Yes No  Number falls in past yr: - - 1 -   Depression Screen PHQ 2/9 Scores 06/28/2015 06/29/2014 06/24/2013 05/25/2012  PHQ - 2 Score 0 0 0 0     Cognitive Testing MMSE - Mini Mental State Exam 06/28/2015  Orientation to time 5  Orientation to Place 5  Registration 3  Attention/ Calculation 0  Recall 3  Language- name 2 objects 0  Language- repeat 1  Language- follow 3 step command 3  Language- read & follow direction 0  Write a sentence 0  Copy design 0  Total score 20   PLEASE NOTE: A Mini-Cog screen was completed. Maximum score is 20. A value of 0 denotes this part of Folstein MMSE was not completed or the patient failed this part of the Mini-Cog screening.   Mini-Cog Screening Orientation to Time - Max 5 pts Orientation to Place - Max 5 pts Registration - Max 3 pts Recall - Max 3 pts Language Repeat - Max 1 pts Language Follow 3 Step Command - Max 3 pts  Immunization History  Administered Date(s) Administered  . Influenza Whole 11/17/2005, 12/16/2007, 11/17/2009  . Influenza, High Dose Seasonal PF 11/22/2014  . Influenza-Unspecified 11/17/2013  . Pneumococcal Conjugate-13 06/29/2014  . Pneumococcal Polysaccharide-23 03/02/2002  . Td 02/18/1996, 03/28/2008   Screening Tests Health Maintenance  Topic Date Due  . ZOSTAVAX  06/27/2016 (Originally 10/18/1995)  . INFLUENZA VACCINE  09/18/2015  . MAMMOGRAM  06/18/2016  . TETANUS/TDAP  03/28/2018  . DEXA SCAN   Completed  . PNA vac Low Risk Adult  Completed      Plan:    I have personally reviewed and addressed the Medicare Annual Wellness questionnaire and have noted the following in the patient's chart:  A. Medical and social history B. Use of alcohol, tobacco or illicit drugs  C. Current medications and supplements D. Functional ability and status E.  Nutritional status F.  Physical activity G. Advance directives H. List of other physicians I.  Hospitalizations, surgeries, and ER visits in previous  12 months J.  [place] to include hearing, vision, cognitive, depression L. Referrals and appointments - none  In addition, I have reviewed and discussed with patient certain preventive protocols, quality metrics, and best practice recommendations. A written personalized care plan for preventive services as well as general preventive health recommendations were provided to patient.  See attached scanned questionnaire for additional information.   Signed,   Lindell Noe, MHA, BS, LPN Health Advisor [phone]

## 2015-07-03 ENCOUNTER — Encounter: Payer: Self-pay | Admitting: Family Medicine

## 2015-07-03 ENCOUNTER — Ambulatory Visit (INDEPENDENT_AMBULATORY_CARE_PROVIDER_SITE_OTHER): Payer: Medicare Other | Admitting: Family Medicine

## 2015-07-03 VITALS — BP 132/72 | HR 64 | Temp 97.8°F | Ht 63.0 in | Wt 148.0 lb

## 2015-07-03 DIAGNOSIS — E038 Other specified hypothyroidism: Secondary | ICD-10-CM | POA: Diagnosis not present

## 2015-07-03 DIAGNOSIS — A1812 Tuberculosis of bladder: Secondary | ICD-10-CM

## 2015-07-03 DIAGNOSIS — I10 Essential (primary) hypertension: Secondary | ICD-10-CM

## 2015-07-03 DIAGNOSIS — Z1211 Encounter for screening for malignant neoplasm of colon: Secondary | ICD-10-CM | POA: Diagnosis not present

## 2015-07-03 DIAGNOSIS — Z23 Encounter for immunization: Secondary | ICD-10-CM | POA: Diagnosis not present

## 2015-07-03 DIAGNOSIS — R198 Other specified symptoms and signs involving the digestive system and abdomen: Secondary | ICD-10-CM

## 2015-07-03 DIAGNOSIS — Z636 Dependent relative needing care at home: Secondary | ICD-10-CM

## 2015-07-03 DIAGNOSIS — E785 Hyperlipidemia, unspecified: Secondary | ICD-10-CM

## 2015-07-03 DIAGNOSIS — E559 Vitamin D deficiency, unspecified: Secondary | ICD-10-CM

## 2015-07-03 DIAGNOSIS — G609 Hereditary and idiopathic neuropathy, unspecified: Secondary | ICD-10-CM

## 2015-07-03 DIAGNOSIS — Z0000 Encounter for general adult medical examination without abnormal findings: Secondary | ICD-10-CM

## 2015-07-03 MED ORDER — VITAMIN B-12 1000 MCG PO TABS: 1000.0000 ug | ORAL_TABLET | Freq: Every day | ORAL | Status: DC

## 2015-07-03 MED ORDER — ATENOLOL 25 MG PO TABS: 12.5000 mg | ORAL_TABLET | Freq: Every day | ORAL | Status: DC

## 2015-07-03 NOTE — Progress Notes (Addendum)
Pre visit review using our clinic review tool, if applicable. No additional management support is needed unless otherwise documented below in the visit note.  She is still caring for her son at home, who has schizophrenia.  She did get him into a PCP with another clinic.   She is safe at home.    D/w pt about shingles shot, done at [facility].   DXA declined per patient preference.  This is reasonable.   No FH colon cancer.  She'll be 82 this fall.  D/w patient [id number] for colon cancer screening, including IFOB vs. colonoscopy.  Risks and benefits of both were discussed and patient voiced understanding.  Pt elects [id number].    HTN.  D/w pt about getting off BB, she wanted to try off med to see if she could tolerate, d/w pt, see AVS.  BP controlled.  No CP.    Elevated Cholesterol: Using medications without problems: yes Muscle aches: not from statin, able to tolerate her current dose.   Diet compliance:yes Exercise:yes, walking bowling kayaking.    Hypothyroidism.  Compliant with replacement, d/w pt re: labs. No neck mass. No dysphagia.   She has intermittent L lower ribcage pressure, more often noted laying down.  Going off/on for years, note more recently.  No R sided sx.    Prickly feeling in the B feet, going on for years.  Still with normal sensation, can feel the floor walking.  Noted more at night.  Not jumpy BLE, not typical RLS sx.  She didn't want rx treatment.    PMH and SH reviewed  ROS: Per HPI unless specifically indicated in ROS section   Meds, vitals, and allergies reviewed.   GEN: nad, alert and oriented HEENT: mucous membranes moist NECK: supple w/o LA CV: rrr.  no murmur PULM: ctab, no inc wob ABD: soft, +bs, not ttp, no masses, no rebound EXT: no edema SKIN: no acute rash  Diabetic foot exam: Normal inspection No skin breakdown No calluses  Normal DP pulses Normal sensation to light touch and monofilament Nails normal

## 2015-07-03 NOTE — Patient Instructions (Addendum)
Go to the lab on the way out.  We'll contact you with your lab report. Stop the atenolol for now.  If you tolerate being off the medicine and your BP is <140/<90, then stay off it.   Restart B12 and see if that helps your feet.   Take care.  Glad to see you.  Update [place] as needed.

## 2015-07-06 DIAGNOSIS — Z0000 Encounter for general adult medical examination without abnormal findings: Secondary | ICD-10-CM | POA: Insufficient documentation

## 2015-07-06 DIAGNOSIS — G609 Hereditary and idiopathic neuropathy, unspecified: Secondary | ICD-10-CM | POA: Insufficient documentation

## 2015-07-06 DIAGNOSIS — R198 Other specified symptoms and signs involving the digestive system and abdomen: Secondary | ICD-10-CM | POA: Insufficient documentation

## 2015-07-06 NOTE — Assessment & Plan Note (Signed)
Controlled continue as is.

## 2015-07-06 NOTE — Assessment & Plan Note (Signed)
She is still caring for her son at home, who has schizophrenia. She did get him into a PCP with another clinic. She is safe at home.

## 2015-07-06 NOTE — Assessment & Plan Note (Signed)
She has intermittent L lower ribcage pressure, more often noted laying down. Going off/on for years, note more recently. No R sided sx.  Benign exam, she'll monitor for now and update me as needed. She agrees.

## 2015-07-06 NOTE — Assessment & Plan Note (Signed)
Not to goal but improved, continue as is.  D/w pt.

## 2015-07-06 NOTE — Assessment & Plan Note (Signed)
Presumed, w/o motor loss.  Restart B12 to see if that helps her sx, she'll update me as needed.

## 2015-07-06 NOTE — Assessment & Plan Note (Signed)
Stop the atenolol for now. If tolerating being off the medicine and her BP is <140/<90, then she'll stay off it.

## 2015-07-06 NOTE — Assessment & Plan Note (Signed)
D/w pt about shingles shot, done at [facility].   DXA declined per patient preference.  This is reasonable.   No FH colon cancer.  She'll be 82 this fall.  D/w patient [id number] for colon cancer screening, including IFOB vs. colonoscopy.  Risks and benefits of both were discussed and patient voiced understanding.  Pt elects [id number].

## 2015-07-25 DIAGNOSIS — X32XXXA Exposure to sunlight, initial encounter: Secondary | ICD-10-CM | POA: Diagnosis not present

## 2015-07-25 DIAGNOSIS — D1801 Hemangioma of skin and subcutaneous tissue: Secondary | ICD-10-CM | POA: Diagnosis not present

## 2015-07-25 DIAGNOSIS — L570 Actinic keratosis: Secondary | ICD-10-CM | POA: Diagnosis not present

## 2015-07-25 DIAGNOSIS — L821 Other seborrheic keratosis: Secondary | ICD-10-CM | POA: Diagnosis not present

## 2015-07-30 ENCOUNTER — Other Ambulatory Visit: Payer: Self-pay | Admitting: Family Medicine

## 2015-08-01 ENCOUNTER — Encounter: Payer: Self-pay | Admitting: Internal Medicine

## 2015-08-01 ENCOUNTER — Ambulatory Visit (INDEPENDENT_AMBULATORY_CARE_PROVIDER_SITE_OTHER): Payer: Medicare Other | Admitting: Internal Medicine

## 2015-08-01 ENCOUNTER — Telehealth: Payer: Self-pay | Admitting: Family Medicine

## 2015-08-01 VITALS — BP 146/80 | HR 85 | Temp 99.0°F | Wt 149.0 lb

## 2015-08-01 DIAGNOSIS — N390 Urinary tract infection, site not specified: Secondary | ICD-10-CM | POA: Diagnosis not present

## 2015-08-01 DIAGNOSIS — R300 Dysuria: Secondary | ICD-10-CM

## 2015-08-01 DIAGNOSIS — R319 Hematuria, unspecified: Secondary | ICD-10-CM

## 2015-08-01 LAB — POC URINALSYSI DIPSTICK (AUTOMATED)
BILIRUBIN UA: NEGATIVE
Glucose, UA: NEGATIVE
Ketones, UA: NEGATIVE
NITRITE UA: NEGATIVE
PH UA: 6
SPEC GRAV UA: 1.01
UROBILINOGEN UA: NEGATIVE

## 2015-08-01 MED ORDER — SULFAMETHOXAZOLE-TRIMETHOPRIM 800-160 MG PO TABS: 1.0000 | ORAL_TABLET | Freq: Two times a day (BID) | ORAL | Status: DC

## 2015-08-01 NOTE — Telephone Encounter (Signed)
Pt has appt 08/01/15 at 2 pm with Avie Echevaria NP.

## 2015-08-01 NOTE — Progress Notes (Signed)
Pre visit review using our clinic review tool, if applicable. No additional management support is needed unless otherwise documented below in the visit note. 

## 2015-08-01 NOTE — Addendum Note (Signed)
Addended by: Jearld Fenton on: 08/01/2015 03:11 PM   Modules accepted: Miquel Dunn

## 2015-08-01 NOTE — Progress Notes (Signed)
HPI  Pt presents to the clinic today with c/o dysuria and blood in her urine. This started this morning. She denies urgency, frequency, nausea, fever, chills or low back pain/. She has increased her water intake and taken Cranberry juice with minimal relief. She has had UTI's in the past and reports this feels the same.   Review of Systems  Past Medical History  Diagnosis Date  . Allergy   . Hyperlipidemia   . Hypertension   . Thyroid disease     Hypothyroidism  . Osteopenia     never on treatment as of 2013  . Murmur     Family History  Problem Relation Age of Onset  . Hypertension Mother   . Heart disease Mother     CHF, MI  . Stroke Father   . Heart disease Father     CHF  . Hypertension Father   . Cancer Paternal Aunt 41    Breast  . Breast cancer Paternal Aunt   . Stroke Maternal Grandmother 80  . Cancer Maternal Grandfather     Lung (smoker)  . Heart disease Paternal Grandmother     MI  . Obesity Paternal Grandmother   . Cystic fibrosis Other     3 died as infants  . Hypertension Other   . Colon cancer Neg Hx     Social History   Social History  . Marital Status: Married    Spouse Name: N/A  . Number of Children: 2  . Years of Education: N/A   Occupational History  . Public affairs consultant in past   . Income Tax, part-time    Social History Main Topics  . Smoking status: Never Smoker   . Smokeless tobacco: Not on file  . Alcohol Use: Yes     Comment: Occasional wine  . Drug Use: No  . Sexual Activity: No   Other Topics Concern  . Not on file   Social History Narrative   Married 1964, lives with husband, 2 adult children (one may have schizophrenia)   Former Engineer, building services, retired from tax work   Enjoys Recruitment consultant    Allergies  Allergen Reactions  . Clindamycin     REACTION: Upset stomach and diarrhea    Constitutional: Denies fever, malaise, fatigue, headache or abrupt weight changes.   GU: Pt reports blood in her  urine and pain with urination. Denies burning sensation, odor or discharge. Skin: Denies redness, rashes, lesions or ulcercations.   No other specific complaints in a complete review of systems (except as listed in HPI above).    Objective:   Physical Exam  BP 146/80 mmHg  Pulse 85  Temp(Src) 99 F (37.2 C) (Oral)  Wt 149 lb (67.586 kg)  SpO2 98% Wt Readings from Last 3 Encounters:  08/01/15 149 lb (67.586 kg)  07/03/15 148 lb (67.132 kg)  06/28/15 147 lb 8 oz (66.906 kg)    General: Appears her stated age, well developed, well nourished in NAD.  Abdomen: Soft. Normal bowel sounds. No distention or masses noted.  Tender to palpation over the bladder area. No CVA tenderness.      Assessment & Plan:   Hematuria and Dysuria secondary to UTI:  Urinalysis: 3+ leuks, 3+ blood Will send urine culture eRx sent if for Septra 1 tab BID x 5 days OK to take AZO OTC Drink plenty of fluids  RTC as needed or if symptoms persist. Webb Silversmith, NP

## 2015-08-01 NOTE — Telephone Encounter (Signed)
Patient Name: Diane Tucker  DOB: Jul 14, 1933    Initial Comment blood in urine, painful urination, thinks it is a UTI   Nurse Assessment  Nurse: Leilani Merl, RN, Heather Date/Time (Eastern Time): 08/01/2015 9:50:43 AM  Confirm and document reason for call. If symptomatic, describe symptoms. You must click the next button to save text entered. ---Caller states that she started with blood in her urine and painful urination this morning.  Has the patient traveled out of the country within the last 30 days? ---Not Applicable  Does the patient have any new or worsening symptoms? ---Yes  Will a triage be completed? ---Yes  Related visit to physician within the last 2 weeks? ---No  Does the PT have any chronic conditions? (i.e. diabetes, asthma, etc.) ---Yes  List chronic conditions. ---See MR  Is this a behavioral health or substance abuse call? ---No     Guidelines    Guideline Title Affirmed Question Affirmed Notes  Urination Pain - Female Side (flank) or lower back pain present    Final Disposition User   See Physician within 4 Hours (or PCP triage) Leilani Merl, RN, Heather    Referrals  REFERRED TO PCP OFFICE   Disagree/Comply: Comply

## 2015-08-01 NOTE — Addendum Note (Signed)
Addended by: Lurlean Nanny on: 08/01/2015 04:21 PM   Modules accepted: Orders, SmartSet

## 2015-08-01 NOTE — Patient Instructions (Signed)

## 2015-08-03 LAB — URINE CULTURE
Colony Count: NO GROWTH
Organism ID, Bacteria: NO GROWTH

## 2015-08-10 DIAGNOSIS — H2511 Age-related nuclear cataract, right eye: Secondary | ICD-10-CM | POA: Diagnosis not present

## 2015-08-11 ENCOUNTER — Other Ambulatory Visit: Payer: Self-pay | Admitting: Family Medicine

## 2015-08-22 ENCOUNTER — Encounter: Payer: Self-pay | Admitting: *Deleted

## 2015-08-26 NOTE — H&P (Signed)
See scanned note.

## 2015-08-27 ENCOUNTER — Ambulatory Visit: Payer: Medicare Other | Admitting: Anesthesiology

## 2015-08-27 ENCOUNTER — Encounter: Payer: Self-pay | Admitting: *Deleted

## 2015-08-27 ENCOUNTER — Encounter
Admission: RE | Disposition: A | Discharge status: Home / Self Care | Payer: Self-pay | Source: Ambulatory Visit | Attending: Ophthalmology

## 2015-08-27 ENCOUNTER — Ambulatory Visit
Admission: RE | Admit: 2015-08-27 | Discharge: 2015-08-27 | Disposition: A | Discharge status: Home / Self Care | Payer: Medicare Other | Source: Ambulatory Visit | Attending: Ophthalmology | Admitting: Ophthalmology

## 2015-08-27 DIAGNOSIS — R011 Cardiac murmur, unspecified: Secondary | ICD-10-CM | POA: Insufficient documentation

## 2015-08-27 DIAGNOSIS — H2511 Age-related nuclear cataract, right eye: Secondary | ICD-10-CM | POA: Diagnosis not present

## 2015-08-27 DIAGNOSIS — K219 Gastro-esophageal reflux disease without esophagitis: Secondary | ICD-10-CM | POA: Insufficient documentation

## 2015-08-27 DIAGNOSIS — Z79899 Other long term (current) drug therapy: Secondary | ICD-10-CM | POA: Insufficient documentation

## 2015-08-27 DIAGNOSIS — E039 Hypothyroidism, unspecified: Secondary | ICD-10-CM | POA: Insufficient documentation

## 2015-08-27 DIAGNOSIS — M1990 Unspecified osteoarthritis, unspecified site: Secondary | ICD-10-CM | POA: Diagnosis not present

## 2015-08-27 DIAGNOSIS — G709 Myoneural disorder, unspecified: Secondary | ICD-10-CM | POA: Diagnosis not present

## 2015-08-27 DIAGNOSIS — I10 Essential (primary) hypertension: Secondary | ICD-10-CM | POA: Diagnosis not present

## 2015-08-27 DIAGNOSIS — H25011 Cortical age-related cataract, right eye: Secondary | ICD-10-CM | POA: Diagnosis not present

## 2015-08-27 DIAGNOSIS — I341 Nonrheumatic mitral (valve) prolapse: Secondary | ICD-10-CM | POA: Insufficient documentation

## 2015-08-27 HISTORY — DX: Cardiac arrhythmia, unspecified: I49.9

## 2015-08-27 HISTORY — DX: Unspecified hearing loss, unspecified ear: H91.90

## 2015-08-27 HISTORY — DX: Edema, unspecified: R60.9

## 2015-08-27 HISTORY — DX: Gastro-esophageal reflux disease without esophagitis: K21.9

## 2015-08-27 HISTORY — DX: Polyneuropathy, unspecified: G62.9

## 2015-08-27 HISTORY — PX: CATARACT EXTRACTION W/PHACO: SHX586

## 2015-08-27 HISTORY — DX: Hypothyroidism, unspecified: E03.9

## 2015-08-27 HISTORY — DX: Unspecified osteoarthritis, unspecified site: M19.90

## 2015-08-27 SURGERY — PHACOEMULSIFICATION, CATARACT, WITH IOL INSERTION
Anesthesia: Monitor Anesthesia Care | Site: Eye | Laterality: Right | Wound class: Clean

## 2015-08-27 MED ORDER — MOXIFLOXACIN HCL 0.5 % OP SOLN
OPHTHALMIC | Status: AC
  Administered 2015-08-27: 1 [drp] via OPHTHALMIC
  Filled 2015-08-27: qty 3

## 2015-08-27 MED ORDER — HYALURONIDASE HUMAN 150 UNIT/ML IJ SOLN
INTRAMUSCULAR | Status: AC
  Filled 2015-08-27: qty 1

## 2015-08-27 MED ORDER — SODIUM CHLORIDE 0.9 % IV SOLN
INTRAVENOUS | Status: DC
Start: 2015-08-27 — End: 2015-08-27
  Administered 2015-08-27: 08:00:00 via INTRAVENOUS

## 2015-08-27 MED ORDER — POVIDONE-IODINE 5 % OP SOLN
OPHTHALMIC | Status: AC
  Filled 2015-08-27: qty 30

## 2015-08-27 MED ORDER — NA CHONDROIT SULF-NA HYALURON 40-17 MG/ML IO SOLN
INTRAOCULAR | Status: DC | PRN
  Administered 2015-08-27: 1 mL via INTRAOCULAR

## 2015-08-27 MED ORDER — MIDAZOLAM HCL 2 MG/2ML IJ SOLN
INTRAMUSCULAR | Status: DC | PRN
  Administered 2015-08-27: 1 mg via INTRAVENOUS

## 2015-08-27 MED ORDER — LIDOCAINE HCL (PF) 4 % IJ SOLN
INTRAOCULAR | Status: DC | PRN
  Administered 2015-08-27: .5 mL via OPHTHALMIC

## 2015-08-27 MED ORDER — ONDANSETRON HCL 4 MG/2ML IJ SOLN
INTRAMUSCULAR | Status: DC | PRN
  Administered 2015-08-27: 4 mg via INTRAVENOUS

## 2015-08-27 MED ORDER — EPINEPHRINE HCL 1 MG/ML IJ SOLN
INTRAMUSCULAR | Status: AC
  Filled 2015-08-27: qty 2

## 2015-08-27 MED ORDER — CARBACHOL 0.01 % IO SOLN
INTRAOCULAR | Status: DC | PRN
  Administered 2015-08-27: 0.5 mL via INTRAOCULAR

## 2015-08-27 MED ORDER — POVIDONE-IODINE 5 % OP SOLN
OPHTHALMIC | Status: DC | PRN
  Administered 2015-08-27: 1 via OPHTHALMIC

## 2015-08-27 MED ORDER — LIDOCAINE HCL (PF) 4 % IJ SOLN
INTRAMUSCULAR | Status: AC
  Filled 2015-08-27: qty 5

## 2015-08-27 MED ORDER — CEFUROXIME OPHTHALMIC INJECTION 1 MG/0.1 ML
INJECTION | OPHTHALMIC | Status: DC | PRN
  Administered 2015-08-27: 0.1 mL via INTRACAMERAL

## 2015-08-27 MED ORDER — MOXIFLOXACIN HCL 0.5 % OP SOLN
1.0000 [drp] | OPHTHALMIC | Status: AC | PRN
  Administered 2015-08-27 (×3): 1 [drp] via OPHTHALMIC

## 2015-08-27 MED ORDER — TETRACAINE HCL 0.5 % OP SOLN
OPHTHALMIC | Status: DC | PRN
  Administered 2015-08-27: 1 [drp] via OPHTHALMIC

## 2015-08-27 MED ORDER — CYCLOPENTOLATE HCL 2 % OP SOLN
OPHTHALMIC | Status: AC
  Administered 2015-08-27: 1 [drp] via OPHTHALMIC
  Filled 2015-08-27: qty 2

## 2015-08-27 MED ORDER — BUPIVACAINE HCL (PF) 0.75 % IJ SOLN
INTRAMUSCULAR | Status: AC
  Filled 2015-08-27: qty 10

## 2015-08-27 MED ORDER — TETRACAINE HCL 0.5 % OP SOLN
OPHTHALMIC | Status: AC
  Filled 2015-08-27: qty 2

## 2015-08-27 MED ORDER — PHENYLEPHRINE HCL 10 % OP SOLN
OPHTHALMIC | Status: AC
  Administered 2015-08-27: 1 [drp] via OPHTHALMIC
  Filled 2015-08-27: qty 5

## 2015-08-27 MED ORDER — CEFUROXIME OPHTHALMIC INJECTION 1 MG/0.1 ML
INJECTION | OPHTHALMIC | Status: AC
  Filled 2015-08-27: qty 0.1

## 2015-08-27 MED ORDER — NA CHONDROIT SULF-NA HYALURON 40-17 MG/ML IO SOLN
INTRAOCULAR | Status: AC
  Filled 2015-08-27: qty 1

## 2015-08-27 MED ORDER — CYCLOPENTOLATE HCL 2 % OP SOLN
1.0000 [drp] | OPHTHALMIC | Status: AC | PRN
  Administered 2015-08-27 (×4): 1 [drp] via OPHTHALMIC

## 2015-08-27 MED ORDER — MOXIFLOXACIN HCL 0.5 % OP SOLN
OPHTHALMIC | Status: DC | PRN
  Administered 2015-08-27: 1 [drp] via OPHTHALMIC

## 2015-08-27 MED ORDER — PHENYLEPHRINE HCL 10 % OP SOLN
1.0000 [drp] | OPHTHALMIC | Status: AC | PRN
  Administered 2015-08-27 (×4): 1 [drp] via OPHTHALMIC

## 2015-08-27 MED ORDER — LIDOCAINE HCL (PF) 4 % IJ SOLN
INTRAMUSCULAR | Status: DC | PRN
  Administered 2015-08-27: 4 mL via OPHTHALMIC

## 2015-08-27 MED ORDER — ALFENTANIL 500 MCG/ML IJ INJ
INJECTION | INTRAMUSCULAR | Status: DC | PRN
Start: 2015-08-27 — End: 2015-08-27
  Administered 2015-08-27: 500 ug via INTRAVENOUS

## 2015-08-27 MED ORDER — EPINEPHRINE HCL 1 MG/ML IJ SOLN
INTRAMUSCULAR | Status: DC | PRN
  Administered 2015-08-27: 1 mL via OPHTHALMIC

## 2015-08-27 SURGICAL SUPPLY — 30 items
CANNULA ANT/CHMB 27GA (MISCELLANEOUS) ×3 IMPLANT
CORD BIP STRL DISP 12FT (MISCELLANEOUS) ×3 IMPLANT
CUP MEDICINE 2OZ PLAST GRAD ST (MISCELLANEOUS) ×3 IMPLANT
DRAPE XRAY CASSETTE 23X24 (DRAPES) ×3 IMPLANT
ERASER HMR WETFIELD 18G (MISCELLANEOUS) ×3 IMPLANT
GLOVE BIO SURGEON STRL SZ8 (GLOVE) ×3 IMPLANT
GLOVE SURG LX 6.5 MICRO (GLOVE) ×2
GLOVE SURG LX 8.0 MICRO (GLOVE) ×2
GLOVE SURG LX STRL 6.5 MICRO (GLOVE) ×1 IMPLANT
GLOVE SURG LX STRL 8.0 MICRO (GLOVE) ×1 IMPLANT
GOWN STRL REUS W/ TWL LRG LVL3 (GOWN DISPOSABLE) ×1 IMPLANT
GOWN STRL REUS W/ TWL XL LVL3 (GOWN DISPOSABLE) ×1 IMPLANT
GOWN STRL REUS W/TWL LRG LVL3 (GOWN DISPOSABLE) ×2
GOWN STRL REUS W/TWL XL LVL3 (GOWN DISPOSABLE) ×2
LENS IOL ACRSF IQ ULTRA 19.0 (Intraocular Lens) ×1 IMPLANT
LENS IOL ACRYSOF IQ 19.0 (Intraocular Lens) ×3 IMPLANT
PACK CATARACT (MISCELLANEOUS) ×3 IMPLANT
PACK CATARACT DINGLEDEIN LX (MISCELLANEOUS) ×3 IMPLANT
PACK EYE AFTER SURG (MISCELLANEOUS) ×3 IMPLANT
SHLD EYE VISITEC  UNIV (MISCELLANEOUS) ×3 IMPLANT
SOL BSS BAG (MISCELLANEOUS) ×3
SOL PREP PVP 2OZ (MISCELLANEOUS) ×3
SOLUTION BSS BAG (MISCELLANEOUS) ×1 IMPLANT
SOLUTION PREP PVP 2OZ (MISCELLANEOUS) ×1 IMPLANT
SUT SILK 5-0 (SUTURE) ×3 IMPLANT
SYR 3ML LL SCALE MARK (SYRINGE) ×3 IMPLANT
SYR 5ML LL (SYRINGE) ×3 IMPLANT
SYR TB 1ML 27GX1/2 LL (SYRINGE) ×3 IMPLANT
WATER STERILE IRR 1000ML POUR (IV SOLUTION) ×3 IMPLANT
WIPE NON LINTING 3.25X3.25 (MISCELLANEOUS) ×3 IMPLANT

## 2015-08-27 NOTE — Anesthesia Preprocedure Evaluation (Signed)
Anesthesia Evaluation  Patient identified by MRN, date of birth, ID band Patient awake    Reviewed: Allergy & Precautions, NPO status , Patient's Chart, lab work & pertinent test results, reviewed documented beta blocker date and time   Airway Mallampati: II  TM Distance: >3 FB     Dental  (+) Chipped   Pulmonary           Cardiovascular hypertension, Pt. on medications and Pt. on home beta blockers + dysrhythmias + Valvular Problems/Murmurs      Neuro/Psych  Neuromuscular disease    GI/Hepatic GERD  Controlled,  Endo/Other  Hypothyroidism   Renal/GU      Musculoskeletal  (+) Arthritis ,   Abdominal   Peds  Hematology   Anesthesia Other Findings MVP.  Reproductive/Obstetrics                             Anesthesia Physical Anesthesia Plan  ASA: III  Anesthesia Plan: MAC   Post-op Pain Management:    Induction:   Airway Management Planned:   Additional Equipment:   Intra-op Plan:   Post-operative Plan:   Informed Consent: I have reviewed the patients History and Physical, chart, labs and discussed the procedure including the risks, benefits and alternatives for the proposed anesthesia with the patient or authorized representative who has indicated his/her understanding and acceptance.     Plan Discussed with: CRNA  Anesthesia Plan Comments:         Anesthesia Quick Evaluation

## 2015-08-27 NOTE — Transfer of Care (Signed)
Anesthesia Post Note  Patient: Diane Tucker  Procedure(s) Performed: Procedure(s) (LRB): CATARACT EXTRACTION PHACO AND INTRAOCULAR LENS PLACEMENT (IOC) (Right)  Anesthesia type: MAC  Patient location: Phase II  Post pain: Pain level controlled  Post assessment: Post-op Vital signs reviewed  Last Vitals:  Filed Vitals:   08/27/15 0604 08/27/15 0817  BP: 153/73 153/58  Pulse: 76 60  Temp: 36.6 C 36.7 C  Resp: 16 16    Post vital signs: stable  Level of consciousness: Patient remains intubated per anesthesia plan  Complications: No apparent anesthesia complications

## 2015-08-27 NOTE — Anesthesia Procedure Notes (Signed)
Procedure Name: MAC Date/Time: 08/27/2015 8:32 AM Performed by: Doreen Salvage Pre-anesthesia Checklist: Patient identified, Emergency Drugs available, Suction available and Patient being monitored Patient Re-evaluated:Patient Re-evaluated prior to inductionOxygen Delivery Method: Nasal cannula

## 2015-08-27 NOTE — Op Note (Signed)
Date of Surgery: 08/27/2015 Date of Dictation: 08/27/2015 8:15 AM Pre-operative Diagnosis:  Nuclear Sclerotic Cataract and Cortical Cataract right Eye Post-operative Diagnosis: same Procedure performed: Extra-capsular Cataract Extraction (ECCE) with placement of a posterior chamber intraocular lens (IOL) right Eye IOL:  Implant Name Type Inv. Item Serial No. Manufacturer Lot No. LRB No. Used  LENS IOL ACRYSOF IQ 19.0 - [id number] Intraocular Lens LENS IOL ACRYSOF IQ 19.0 [id number] ALCON   Right 1   Anesthesia: 2% Lidocaine and 4% Marcaine in a 50/50 mixture with 10 unites/ml of Hylenex given as a peribulbar Anesthesiologist: Anesthesiologist: Gunnar Bulla, MD CRNA: Doreen Salvage, CRNA Complications: none Estimated Blood Loss: less than 1 ml  Description of procedure:  The patient was given anesthesia and sedation via intravenous access. The patient was then prepped and draped in the usual fashion. A 25-gauge needle was bent for initiating the capsulorhexis. A 5-0 silk suture was placed through the conjunctiva superior and inferiorly to serve as bridle sutures. Hemostasis was obtained at the superior limbus using an eraser cautery. A partial thickness groove was made at the anterior surgical limbus with a 64 Beaver blade and this was dissected anteriorly with an Avaya. The anterior chamber was entered at 10 o'clock with a 1.0 mm paracentesis knife and through the lamellar dissection with a 2.6 mm Alcon keratome. Epi-Shugarcaine 0.5 CC [9 cc BSS Plus (Alcon), 3 cc 4% preservative-free lidocaine (Hospira) and 4 cc 1:1000 preservative-free, bisulfite-free epinephrine] was injected into the anterior chamber via the paracentesis tract. Epi-Shugarcaine 0.5 CC [9 cc BSS Plus (Alcon), 3 cc 4% preservative-free lidocaine (Hospira) and 4 cc 1:1000 preservative-free, bisulfite-free epinephrine] was injected into the anterior chamber via the paracentesis tract. DiscoVisc was injected to replace  the aqueous and a continuous tear curvilinear capsulorhexis was performed using a bent 25-gauge needle.  Balance salt on a syringe was used to perform hydro-dissection and phacoemulsification was carried out using a divide and conquer technique. Procedure(s) with comments: CATARACT EXTRACTION PHACO AND INTRAOCULAR LENS PLACEMENT (IOC) (Right) - [place]  01:22 AP%24.0 CDE 34.56 fluid pack lot # [id number]. Irrigation/aspiration was used to remove the residual cortex and the capsular bag was inflated with DiscoVisc. The intraocular lens was inserted into the capsular bag using a pre-loaded UltraSert Delivery System. Irrigation/aspiration was used to remove the residual DiscoVisc. The wound was inflated with balanced salt and checked for leaks. None were found. Miostat was injected via the paracentesis track and 0.1 ml of cefuroxime containing 1 mg of drug  was injected via the paracentesis track. The wound was checked for leaks again and none were found.   The bridal sutures were removed and two drops of Vigamox were placed on the eye. An eye shield was placed to protect the eye and the patient was discharged to the recovery area in good condition.   Lorelee Mclaurin MD

## 2015-08-27 NOTE — Interval H&P Note (Signed)
History and Physical Interval Note:  08/27/2015 7:39 AM  Diane Tucker  has presented today for surgery, with the diagnosis of nuclear sclerotic cataract right eye  The various methods of treatment have been discussed with the patient and family. After consideration of risks, benefits and other options for treatment, the patient has consented to  Procedure(s) with comments: CATARACT EXTRACTION PHACO AND INTRAOCULAR LENS PLACEMENT (IOC) (Right) - [place] AP% CDE fluid pack lot # [id number] Tucker as a surgical intervention .  The patient's history has been reviewed, patient examined, no change in status, stable for surgery.  I have reviewed the patient's chart and labs.  Questions were answered to the patient's satisfaction.     Cristal Howatt

## 2015-08-27 NOTE — Anesthesia Postprocedure Evaluation (Signed)
Anesthesia Post Note  Patient: Diane Tucker  Procedure(s) Performed: Procedure(s) (LRB): CATARACT EXTRACTION PHACO AND INTRAOCULAR LENS PLACEMENT (IOC) (Right)  Patient location during evaluation: Other Anesthesia Type: MAC Level of consciousness: awake and alert, oriented and patient cooperative Pain management: pain level controlled Vital Signs Assessment: post-procedure vital signs reviewed and stable Respiratory status: spontaneous breathing Cardiovascular status: blood pressure returned to baseline and stable Postop Assessment: no backache, no headache and no signs of nausea or vomiting Anesthetic complications: no    Last Vitals:  Filed Vitals:   08/27/15 0604 08/27/15 0817  BP: 153/73 153/58  Pulse: 76 60  Temp: 36.6 C 36.7 C  Resp: 16 16    Last Pain:  Filed Vitals:   08/27/15 0820  PainSc: 0-No pain                 Alison Stalling

## 2015-08-27 NOTE — Discharge Instructions (Addendum)
See handout.Eye Surgery Discharge Instructions ° °Expect mild scratchy sensation or mild soreness. °DO NOT RUB YOUR EYE! ° °The day of surgery: °• Minimal physical activity, but bed rest is not required °• No reading, computer work, or close hand work °• No bending, lifting, or straining. °• May watch TV ° °For 24 hours: °• No driving, legal decisions, or alcoholic beverages °• Safety precautions °• Eat anything you prefer: It is better to start with liquids, then soup then solid foods. °• _____ Eye patch should be worn until postoperative exam tomorrow. °• ____ Solar shield eyeglasses should be worn for comfort in the sunlight/patch while sleeping ° °Resume all regular medications including aspirin or Coumadin if these were discontinued prior to surgery. °You may shower, bathe, shave, or wash your hair. °Tylenol may be taken for mild discomfort. ° °Call your doctor if you experience significant pain, nausea, or vomiting, fever > 101 or other signs of infection. [phone] or [phone] °Specific instructions: ° °                                                                                                     AMBULATORY SURGERY  °DISCHARGE INSTRUCTIONS ° ° °1) The drugs that you were given will stay in your system until tomorrow so for the next 24 hours you should not: ° °A) Drive an automobile °B) Make any legal decisions °C) Drink any alcoholic beverage ° ° °2) You may resume regular meals tomorrow.  Today it is better to start with liquids and gradually work up to solid foods. ° °You may eat anything you prefer, but it is better to start with liquids, then soup and crackers, and gradually work up to solid foods. ° ° °3) Please notify your doctor immediately if you have any unusual bleeding, trouble breathing, redness and pain at the surgery site, drainage, fever, or pain not relieved by medication. ° ° ° °4) Additional Instructions: ° ° ° ° ° ° ° °Please contact your physician with any problems or Same Day  Surgery at [phone], Monday through Friday 6 am to 4 pm, or [place] [place] [facility] [place] Main number at [phone]. °

## 2015-10-11 ENCOUNTER — Other Ambulatory Visit (INDEPENDENT_AMBULATORY_CARE_PROVIDER_SITE_OTHER): Payer: Medicare Other

## 2015-10-11 DIAGNOSIS — Z1211 Encounter for screening for malignant neoplasm of colon: Secondary | ICD-10-CM | POA: Diagnosis not present

## 2015-10-11 LAB — FECAL OCCULT BLOOD, IMMUNOCHEMICAL: FECAL OCCULT BLD: NEGATIVE

## 2015-11-26 DIAGNOSIS — Z23 Encounter for immunization: Secondary | ICD-10-CM | POA: Diagnosis not present

## 2016-04-25 ENCOUNTER — Other Ambulatory Visit: Payer: Self-pay | Admitting: Family Medicine

## 2016-05-07 DIAGNOSIS — Z961 Presence of intraocular lens: Secondary | ICD-10-CM | POA: Diagnosis not present

## 2016-05-27 ENCOUNTER — Other Ambulatory Visit: Payer: Self-pay | Admitting: Family Medicine

## 2016-05-27 DIAGNOSIS — Z1231 Encounter for screening mammogram for malignant neoplasm of breast: Secondary | ICD-10-CM

## 2016-06-20 ENCOUNTER — Ambulatory Visit
Admission: RE | Admit: 2016-06-20 | Discharge: 2016-06-20 | Disposition: A | Discharge status: Home / Self Care | Payer: Medicare Other | Source: Ambulatory Visit | Attending: Family Medicine | Admitting: Family Medicine

## 2016-06-20 DIAGNOSIS — Z1231 Encounter for screening mammogram for malignant neoplasm of breast: Secondary | ICD-10-CM

## 2016-06-23 ENCOUNTER — Encounter: Payer: Self-pay | Admitting: *Deleted

## 2016-06-29 ENCOUNTER — Other Ambulatory Visit: Payer: Self-pay | Admitting: Family Medicine

## 2016-06-29 DIAGNOSIS — E038 Other specified hypothyroidism: Secondary | ICD-10-CM

## 2016-06-29 DIAGNOSIS — E559 Vitamin D deficiency, unspecified: Secondary | ICD-10-CM

## 2016-06-29 DIAGNOSIS — E785 Hyperlipidemia, unspecified: Secondary | ICD-10-CM

## 2016-06-29 DIAGNOSIS — M8580 Other specified disorders of bone density and structure, unspecified site: Secondary | ICD-10-CM

## 2016-06-30 ENCOUNTER — Ambulatory Visit (INDEPENDENT_AMBULATORY_CARE_PROVIDER_SITE_OTHER): Payer: Medicare Other

## 2016-06-30 VITALS — BP 142/74 | HR 76 | Temp 97.5°F | Ht 62.5 in | Wt 152.0 lb

## 2016-06-30 DIAGNOSIS — E038 Other specified hypothyroidism: Secondary | ICD-10-CM

## 2016-06-30 DIAGNOSIS — I10 Essential (primary) hypertension: Secondary | ICD-10-CM | POA: Diagnosis not present

## 2016-06-30 DIAGNOSIS — E7849 Other hyperlipidemia: Secondary | ICD-10-CM

## 2016-06-30 DIAGNOSIS — E784 Other hyperlipidemia: Secondary | ICD-10-CM

## 2016-06-30 DIAGNOSIS — E559 Vitamin D deficiency, unspecified: Secondary | ICD-10-CM

## 2016-06-30 DIAGNOSIS — Z0000 Encounter for general adult medical examination without abnormal findings: Secondary | ICD-10-CM

## 2016-06-30 NOTE — Patient Instructions (Signed)
Diane Tucker , Thank you for taking time to come for your Medicare Wellness Visit. I appreciate your ongoing commitment to your health goals. Please review the following plan we discussed and let me know if I can assist you in the future.   These are the goals we discussed: Goals    . Increase physical activity          Starting 06/30/2016, I will continue to exercise at least 15 min 3 days per week.        This is a list of the screening recommended for you and due dates:  Health Maintenance  Topic Date Due  . Flu Shot  09/17/2016  . Mammogram  06/20/2017  . Tetanus Vaccine  03/28/2018  . DEXA scan (bone density measurement)  Completed  . Pneumonia vaccines  Completed   Preventive Care for Adults  A healthy lifestyle and preventive care can promote health and wellness. Preventive health guidelines for adults include the following key practices.  . A routine yearly physical is a good way to check with your health care provider about your health and preventive screening. It is a chance to share any concerns and updates on your health and to receive a thorough exam.  . Visit your dentist for a routine exam and preventive care every 6 months. Brush your teeth twice a day and floss once a day. Good oral hygiene prevents tooth decay and gum disease.  . The frequency of eye exams is based on your age, health, family medical history, use  of contact lenses, and other factors. Follow your health care provider's ecommendations for frequency of eye exams.  . Eat a healthy diet. Foods like vegetables, fruits, whole grains, low-fat dairy products, and lean protein foods contain the nutrients you need without too many calories. Decrease your intake of foods high in solid fats, added sugars, and salt. Eat the right amount of calories for you. Get information about a proper diet from your health care provider, if necessary.  . Regular physical exercise is one of the most important things you can do  for your health. Most adults should get at least 150 minutes of moderate-intensity exercise (any activity that increases your heart rate and causes you to sweat) each week. In addition, most adults need muscle-strengthening exercises on 2 or more days a week.  Silver Sneakers may be a benefit available to you. To determine eligibility, you may visit the website: www.silversneakers.com or contact program at 260-418-3037 Mon-Fri between 8AM-8PM.   . Maintain a healthy weight. The body mass index (BMI) is a screening tool to identify possible weight problems. It provides an estimate of body fat based on height and weight. Your health care provider can find your BMI and can help you achieve or maintain a healthy weight.   For adults 20 years and older: ? A BMI below 18.5 is considered underweight. ? A BMI of 18.5 to 24.9 is normal. ? A BMI of 25 to 29.9 is considered overweight. ? A BMI of 30 and above is considered obese.   . Maintain normal blood lipids and cholesterol levels by exercising and minimizing your intake of saturated fat. Eat a balanced diet with plenty of fruit and vegetables. Blood tests for lipids and cholesterol should begin at age 69 and be repeated every 5 years. If your lipid or cholesterol levels are high, you are over 50, or you are at high risk for heart disease, you may need your cholesterol levels checked more  frequently. Ongoing high lipid and cholesterol levels should be treated with medicines if diet and exercise are not working.  . If you smoke, find out from your health care provider how to quit. If you do not use tobacco, please do not start.  . If you choose to drink alcohol, please do not consume more than 2 drinks per day. One drink is considered to be 12 ounces (355 mL) of beer, 5 ounces (148 mL) of wine, or 1.5 ounces (44 mL) of liquor.  . If you are 24-76 years old, ask your health care provider if you should take aspirin to prevent strokes.  . Use sunscreen.  Apply sunscreen liberally and repeatedly throughout the day. You should seek shade when your shadow is shorter than you. Protect yourself by wearing long sleeves, pants, a wide-brimmed hat, and sunglasses year round, whenever you are outdoors.  . Once a month, do a whole body skin exam, using a mirror to look at the skin on your back. Tell your health care provider of new moles, moles that have irregular borders, moles that are larger than a pencil eraser, or moles that have changed in shape or color.

## 2016-06-30 NOTE — Progress Notes (Signed)
Subjective:   Diane Tucker is a 83 y.o. female who presents for Medicare Annual (Subsequent) preventive examination.  Review of Systems:  N/A Cardiac Risk Factors include: advanced age (>72men, >20 women);dyslipidemia;hypertension     Objective:     Vitals: BP (!) 142/74 (BP Location: Right Arm, Patient Position: Sitting, Cuff Size: Normal)   Pulse 76   Temp 97.5 F (36.4 C) (Oral)   Ht 5' 2.5" (1.588 m) Comment: no shoes  Wt 152 lb (68.9 kg)   SpO2 97%   BMI 27.36 kg/m   Body mass index is 27.36 kg/m.   Tobacco History  Smoking Status  . Never Smoker  Smokeless Tobacco  . Never Used     Counseling given: No   Past Medical History:  Diagnosis Date  . Allergy   . Arthritis   . Dysrhythmia   . Edema    ANKLES  . GERD (gastroesophageal reflux disease)   . HOH (hard of hearing)    AIDS  . Hyperlipidemia   . Hypertension   . Hypothyroidism   . Murmur   . Neuropathy    FEET  . Osteopenia    never on treatment as of 2013  . Thyroid disease    Hypothyroidism   Past Surgical History:  Procedure Laterality Date  . CATARACT EXTRACTION W/PHACO Right 08/27/2015   Procedure: CATARACT EXTRACTION PHACO AND INTRAOCULAR LENS PLACEMENT (IOC);  Surgeon: Estill Cotta, MD;  Location: [facility] ORS;  Service: Ophthalmology;  Laterality: Right;  [place]  01:22 AP%24.0 CDE 34.56 fluid pack lot # [id number]  . DOPPLER ECHOCARDIOGRAPHY  3/96   MVP with MR (TR) - prophylaxis  . EYE SURGERY    . Holter Monitor     NSR, freq. PVC's, rare colp, freq. PAC's  . NSVD     X 2  . Rheumatic Fever     (?) as a child. Hosp, prolonged fever for several days.  . TONSILLECTOMY     As a child   Family History  Problem Relation Age of Onset  . Hypertension Mother   . Heart disease Mother        CHF, MI  . Stroke Father   . Heart disease Father        CHF  . Hypertension Father   . Heart disease Paternal Grandmother        MI  . Obesity Paternal Grandmother   . Cystic fibrosis  Other        3 died as infants  . Cancer Paternal Aunt 23       Breast  . Breast cancer Paternal Aunt   . Stroke Maternal Grandmother 80  . Cancer Maternal Grandfather        Lung (smoker)  . Hypertension Other   . Colon cancer Neg Hx    History  Sexual Activity  . Sexual activity: No    Outpatient Encounter Prescriptions as of 06/30/2016  Medication Sig  . amoxicillin (AMOXIL) 500 MG tablet 2 grams prior to dental appointments.  Marland Kitchen atorvastatin (LIPITOR) 20 MG tablet TAKE ONE-HALF (1/2) TABLET DAILY  . Cholecalciferol (VITAMIN D3) 1000 units CAPS Take 1 capsule by mouth daily.  . Coenzyme Q10 (COQ10) 200 MG CAPS Take 1 capsule by mouth daily.  . hydroxypropyl methylcellulose (ISOPTO TEARS) 2.5 % ophthalmic solution 1 drop 3 (three) times daily as needed.  . loratadine (CLARITIN) 10 MG tablet Take 10 mg by mouth daily as needed for allergies.  . Multiple Vitamin (MULTIVITAMIN) tablet Take  1 tablet by mouth daily.  . naproxen sodium (ANAPROX) 220 MG tablet Take 220 mg by mouth as needed.  Marland Kitchen oxymetazoline (AFRIN) 0.05 % nasal spray Place 2 sprays into the nose 2 (two) times daily as needed for congestion.  . simethicone (MYLICON) 350 MG chewable tablet Chew 125 mg by mouth every 6 (six) hours as needed for flatulence.  . sodium chloride (OCEAN) 0.65 % nasal spray Place 1 spray into the nose as needed.  Marland Kitchen SYNTHROID 100 MCG tablet TAKE 1 TABLET DAILY  . vitamin B-12 (CYANOCOBALAMIN) 1000 MCG tablet Take 1 tablet (1,000 mcg total) by mouth daily.  Marland Kitchen atenolol (TENORMIN) 25 MG tablet Take 0.5 tablets (12.5 mg total) by mouth daily. (Patient not taking: Reported on 08/01/2015)  . [DISCONTINUED] DUREZOL 0.05 % EMUL   . [DISCONTINUED] sulfamethoxazole-trimethoprim (BACTRIM DS,SEPTRA DS) 800-160 MG tablet Take 1 tablet by mouth 2 (two) times daily. (Patient not taking: Reported on 08/15/2015)   No facility-administered encounter medications on file as of 06/30/2016.     Activities of Daily  Living In your present state of health, do you have any difficulty performing the following activities: 06/30/2016  Hearing? Y  Vision? N  Difficulty concentrating or making decisions? N  Walking or climbing stairs? N  Dressing or bathing? N  Doing errands, shopping? N  Preparing Food and eating ? N  Using the Toilet? N  In the past six months, have you accidently leaked urine? Y  Do you have problems with loss of bowel control? N  Managing your Medications? N  Managing your Finances? N  Housekeeping or managing your Housekeeping? N  Some recent data might be hidden    Patient Care Team: Tonia Ghent, MD as PCP - General (Family Medicine) Dingeldein, Remo Lipps, MD as Consulting Physician (Ophthalmology) Oneta Rack, MD as Consulting Physician (Dermatology) Kipp Laurence, MD as Referring Physician (Audiology) Sherwood Gambler, DDS as Consulting Physician (Dentistry)    Assessment:    Hearing Screening Comments: Bilateral hearing aids Vision Screening Comments: Last vision exam in March 2018 with Dr. Sandra Cockayne  Exercise Activities and Dietary recommendations Current Exercise Habits: Home exercise routine, Type of exercise: walking, Time (Minutes): 10, Frequency (Times/Week): 7, Weekly Exercise (Minutes/Week): 70, Intensity: Mild, Exercise limited by: None identified  Goals    . Increase physical activity          Starting 06/30/2016, I will continue to exercise at least 15 min 3 days per week.       Fall Risk Fall Risk  06/30/2016 06/28/2015 06/29/2014 06/24/2013 05/25/2012  Falls in the past year? Yes No No Yes No  Number falls in past yr: 1 - - 1 -  Injury with Fall? Yes - - - -   Depression Screen PHQ 2/9 Scores 06/30/2016 06/28/2015 06/29/2014 06/24/2013  PHQ - 2 Score 0 0 0 0     Cognitive Function MMSE - Mini Mental State Exam 06/30/2016 06/28/2015  Orientation to time 5 5  Orientation to Place 5 5  Registration 3 3  Attention/ Calculation 0 0  Recall 3 3    Language- name 2 objects 0 0  Language- repeat 1 1  Language- follow 3 step command 3 3  Language- read & follow direction 0 0  Write a sentence 0 0  Copy design 0 0  Total score 20 20       PLEASE NOTE: A Mini-Cog screen was completed. Maximum score is 20. A value of 0 denotes this part of  [place] MMSE was not completed or the patient failed this part of the Mini-Cog screening.   Mini-Cog Screening Orientation to Time - Max 5 pts Orientation to Place - Max 5 pts Registration - Max 3 pts Recall - Max 3 pts Language Repeat - Max 1 pts Language Follow 3 Step Command - Max 3 pts   Immunization History  Administered Date(s) Administered  . Influenza Whole 11/17/2005, 12/16/2007, 11/17/2009  . Influenza, High Dose Seasonal PF 11/22/2014  . Influenza-Unspecified 11/17/2013, 11/18/2015  . Pneumococcal Conjugate-13 06/29/2014  . Pneumococcal Polysaccharide-23 03/02/2002  . Td 02/18/1996, 03/28/2008  . Zoster 07/03/2015   Screening Tests Health Maintenance  Topic Date Due  . INFLUENZA VACCINE  09/17/2016  . MAMMOGRAM  06/20/2017  . TETANUS/TDAP  03/28/2018  . DEXA SCAN  Completed  . PNA vac Low Risk Adult  Completed      Plan:     I have personally reviewed and addressed the Medicare Annual Wellness questionnaire and have noted the following in the patient's chart:  A. Medical and social history B. Use of alcohol, tobacco or illicit drugs  C. Current medications and supplements D. Functional ability and status E.  Nutritional status F.  Physical activity G. Advance directives H. List of other physicians I.  Hospitalizations, surgeries, and ER visits in previous 12 months J.  [place] to include hearing, vision, cognitive, depression L. Referrals and appointments - none  In addition, I have reviewed and discussed with patient certain preventive protocols, quality metrics, and best practice recommendations. A written personalized care plan for preventive  services as well as general preventive health recommendations were provided to patient.  See attached scanned questionnaire for additional information.   Signed,   Lindell Noe, MHA, BS, LPN Health Coach

## 2016-06-30 NOTE — Progress Notes (Signed)
PCP notes:   Health maintenance:  No gaps identified.   Abnormal screenings:   Fall risk - hx of fall with injury; no medical treatment  Patient concerns:   None  Nurse concerns:  None  Next PCP appt:   07/10/16 @ 1045  I reviewed health advisor's note, was available for consultation on the day of service listed in this note, and agree with documentation and plan. Elsie Stain, MD.

## 2016-06-30 NOTE — Progress Notes (Signed)
Pre visit review using our clinic review tool, if applicable. No additional management support is needed unless otherwise documented below in the visit note. 

## 2016-07-01 ENCOUNTER — Other Ambulatory Visit (INDEPENDENT_AMBULATORY_CARE_PROVIDER_SITE_OTHER): Payer: Medicare Other

## 2016-07-01 DIAGNOSIS — E784 Other hyperlipidemia: Secondary | ICD-10-CM

## 2016-07-01 DIAGNOSIS — I10 Essential (primary) hypertension: Secondary | ICD-10-CM | POA: Diagnosis not present

## 2016-07-01 DIAGNOSIS — E559 Vitamin D deficiency, unspecified: Secondary | ICD-10-CM | POA: Diagnosis not present

## 2016-07-01 DIAGNOSIS — E038 Other specified hypothyroidism: Secondary | ICD-10-CM

## 2016-07-01 DIAGNOSIS — E7849 Other hyperlipidemia: Secondary | ICD-10-CM

## 2016-07-01 LAB — COMPREHENSIVE METABOLIC PANEL
ALBUMIN: 4.2 g/dL (ref 3.5–5.2)
ALT: 16 U/L (ref 0–35)
AST: 23 U/L (ref 0–37)
Alkaline Phosphatase: 68 U/L (ref 39–117)
BUN: 21 mg/dL (ref 6–23)
CALCIUM: 9.7 mg/dL (ref 8.4–10.5)
CHLORIDE: 104 meq/L (ref 96–112)
CO2: 31 meq/L (ref 19–32)
CREATININE: 0.94 mg/dL (ref 0.40–1.20)
GFR: 60.79 mL/min (ref >60.00)
Glucose, Bld: 95 mg/dL (ref 70–99)
POTASSIUM: 4.3 meq/L (ref 3.5–5.1)
Sodium: 140 mEq/L (ref 135–145)
Total Bilirubin: 0.7 mg/dL (ref 0.2–1.2)
Total Protein: 6.8 g/dL (ref 6.0–8.3)

## 2016-07-01 LAB — LIPID PANEL
CHOL/HDL RATIO: 3
Cholesterol: 175 mg/dL (ref 0–200)
HDL: 62.4 mg/dL (ref >39.00)
LDL CALC: 93 mg/dL (ref 0–99)
NonHDL: 112.19
TRIGLYCERIDES: 95 mg/dL (ref 0.0–149.0)
VLDL: 19 mg/dL (ref 0.0–40.0)

## 2016-07-01 LAB — VITAMIN D 25 HYDROXY (VIT D DEFICIENCY, FRACTURES): VITD: 25.75 ng/mL — AB (ref 30.00–100.00)

## 2016-07-01 LAB — TSH: TSH: 0.67 u[IU]/mL (ref 0.35–4.50)

## 2016-07-10 ENCOUNTER — Encounter: Payer: Self-pay | Admitting: Family Medicine

## 2016-07-10 ENCOUNTER — Ambulatory Visit (INDEPENDENT_AMBULATORY_CARE_PROVIDER_SITE_OTHER): Payer: Medicare Other | Admitting: Family Medicine

## 2016-07-10 DIAGNOSIS — E785 Hyperlipidemia, unspecified: Secondary | ICD-10-CM | POA: Diagnosis not present

## 2016-07-10 DIAGNOSIS — R195 Other fecal abnormalities: Secondary | ICD-10-CM | POA: Diagnosis not present

## 2016-07-10 DIAGNOSIS — I10 Essential (primary) hypertension: Secondary | ICD-10-CM

## 2016-07-10 DIAGNOSIS — E559 Vitamin D deficiency, unspecified: Secondary | ICD-10-CM | POA: Diagnosis not present

## 2016-07-10 DIAGNOSIS — E038 Other specified hypothyroidism: Secondary | ICD-10-CM | POA: Diagnosis not present

## 2016-07-10 DIAGNOSIS — M8580 Other specified disorders of bone density and structure, unspecified site: Secondary | ICD-10-CM

## 2016-07-10 DIAGNOSIS — Z636 Dependent relative needing care at home: Secondary | ICD-10-CM | POA: Diagnosis not present

## 2016-07-10 MED ORDER — ATORVASTATIN CALCIUM 10 MG PO TABS: 10.0000 mg | ORAL_TABLET | Freq: Every day | ORAL | 3 refills | Status: DC

## 2016-07-10 MED ORDER — SYNTHROID 100 MCG PO TABS: 100.0000 ug | ORAL_TABLET | Freq: Every day | ORAL | 3 refills | Status: DC

## 2016-07-10 NOTE — Patient Instructions (Addendum)
Check the CDC site prior to travel.   You can try 12.5-25mg  of meclizine if needed for sea sickness.   Take care.  Glad to see you.  Update me as needed.

## 2016-07-10 NOTE — Assessment & Plan Note (Signed)
She will update me if I can be of service re: her son.  D/w pt.

## 2016-07-10 NOTE — Assessment & Plan Note (Signed)
If persistent or any red flag sx then she can update me.  Rec: inc in fiber in the meantime.

## 2016-07-10 NOTE — Assessment & Plan Note (Signed)
tsh wnl, no tmg, continue as is.  Labs d/w pt.

## 2016-07-10 NOTE — Assessment & Plan Note (Signed)
Continue statin, labs d/w pt.  Continue work on diet and exercise.   

## 2016-07-10 NOTE — Assessment & Plan Note (Signed)
Encouraged daily Vit D use, defer DXA for now, see above.

## 2016-07-10 NOTE — Progress Notes (Signed)
Hypertension:    Off meds currently.   Chest pain with exertion: no Edema:no Short of breath:no BP controlled at home.   Some occ palpitations vs skipped beats, she prev would feel an extra/notable beat but that resolved in the meantime.  D/w pt.    Elevated Cholesterol: Using medications without problems:yes Muscle aches: no Diet compliance: encouraged.   Exercise: encouraged.   Hypothyroidism.  No neck mass, no dysphagia.  She has some mild fatigue.    Mammogram up to date.   DXA d/w pt.  She wasn't enthused about extra meds so reasonable to defer DXA at this point.   Advance directive- husband designated if patient were incapacitated.  Her son's situation is about the same.  She is trying to work through options.    She has missed some days of vitamin D.  D/w pt about daily use.  Vit D slightly low.    She has unchanged neuropathy in the BLE. She has balance exercises to do daily.  D/w pt.  She is still kayaking and bowling.    Other concerns:  Some occ constipation, pebble shaped stools- not consistent, only episodic.  No obstructive sx, no blood in stool.  D/w pt about inc in fiber.    Going on [employer].  Will be going for about 2 weeks.  She doesn't usually get sea sick.  D/w pt.  See AVS.    Meds, vitals, and allergies reviewed.   PMH and SH reviewed  ROS: Per HPI unless specifically indicated in ROS section   GEN: nad, alert and oriented HEENT: mucous membranes moist NECK: supple w/o LA, no tmg CV: rrr. PULM: ctab, no inc wob ABD: soft, +bs EXT: no edema SKIN: no acute rash

## 2016-07-10 NOTE — Assessment & Plan Note (Signed)
See above

## 2016-07-10 NOTE — Assessment & Plan Note (Signed)
Has been controlled of meds at home, if she doesn't have more palpitations or sx then she may not need to restart BB.  We can follow clinically.

## 2016-07-15 DIAGNOSIS — L821 Other seborrheic keratosis: Secondary | ICD-10-CM | POA: Diagnosis not present

## 2016-07-15 DIAGNOSIS — D1801 Hemangioma of skin and subcutaneous tissue: Secondary | ICD-10-CM | POA: Diagnosis not present

## 2016-07-24 ENCOUNTER — Other Ambulatory Visit: Payer: Self-pay | Admitting: Family Medicine

## 2016-07-24 NOTE — Telephone Encounter (Signed)
Recently sent to [facility].  Left message on patient's voicemail to return call.

## 2016-07-29 ENCOUNTER — Other Ambulatory Visit: Payer: Self-pay | Admitting: *Deleted

## 2016-07-29 MED ORDER — SYNTHROID 100 MCG PO TABS: 100.0000 ug | ORAL_TABLET | Freq: Every day | ORAL | 3 refills | Status: DC

## 2016-07-29 MED ORDER — ATORVASTATIN CALCIUM 10 MG PO TABS: 10.0000 mg | ORAL_TABLET | Freq: Every day | ORAL | 3 refills | Status: DC

## 2016-11-18 DIAGNOSIS — Z23 Encounter for immunization: Secondary | ICD-10-CM | POA: Diagnosis not present

## 2016-11-20 ENCOUNTER — Encounter: Payer: Self-pay | Admitting: Family Medicine

## 2017-04-27 ENCOUNTER — Ambulatory Visit (INDEPENDENT_AMBULATORY_CARE_PROVIDER_SITE_OTHER): Payer: Medicare Other | Admitting: Internal Medicine

## 2017-04-27 ENCOUNTER — Encounter: Payer: Self-pay | Admitting: Internal Medicine

## 2017-04-27 ENCOUNTER — Ambulatory Visit (INDEPENDENT_AMBULATORY_CARE_PROVIDER_SITE_OTHER)
Admission: RE | Admit: 2017-04-27 | Discharge: 2017-04-27 | Disposition: A | Discharge status: Home / Self Care | Payer: Medicare Other | Source: Ambulatory Visit | Attending: Internal Medicine | Admitting: Internal Medicine

## 2017-04-27 VITALS — BP 146/88 | HR 76 | Temp 98.2°F | Wt 154.0 lb

## 2017-04-27 DIAGNOSIS — M25532 Pain in left wrist: Secondary | ICD-10-CM

## 2017-04-27 DIAGNOSIS — S6992XA Unspecified injury of left wrist, hand and finger(s), initial encounter: Secondary | ICD-10-CM | POA: Diagnosis not present

## 2017-04-27 NOTE — Patient Instructions (Signed)
RICE for Routine Care of Injuries Many injuries can be cared for using rest, ice, compression, and elevation (RICE therapy). Using RICE therapy can help to lessen pain and swelling. It can help your body to heal. Rest Reduce your normal activities and avoid using the injured part of your body. You can go back to your normal activities when you feel okay and your doctor says it is okay. Ice Do not put ice on your bare skin.  Put ice in a plastic bag.  Place a towel between your skin and the bag.  Leave the ice on for 20 minutes, 2-3 times a day.  Do this for as long as told by your doctor. Compression Compression means putting pressure on the injured area. This can be done with an elastic bandage. If an elastic bandage has been applied:  Remove and reapply the bandage every 3-4 hours or as told by your doctor.  Make sure the bandage is not wrapped too tight. Wrap the bandage more loosely if part of your body beyond the bandage is blue, swollen, cold, painful, or loses feeling (numb).  See your doctor if the bandage seems to make your problems worse.  Elevation Elevation means keeping the injured area raised. Raise the injured area above your heart or the center of your chest if you can. When should I get help? You should get help if:  You keep having pain and swelling.  Your symptoms get worse.  Get help right away if: You should get help right away if:  You have sudden bad pain at or below the area of your injury.  You have redness or more swelling around your injury.  You have tingling or numbness at or below the injury that does not go away when you take off the bandage.  This information is not intended to replace advice given to you by your health care provider. Make sure you discuss any questions you have with your health care provider. Document Released: 07/23/2007 Document Revised: 01/01/2016 Document Reviewed: 01/11/2014 Elsevier Interactive Patient Education  2017  [employer]  

## 2017-04-27 NOTE — Progress Notes (Signed)
Subjective:    Patient ID: Diane Tucker, female    DOB: Sep 15, 1933, 84 y.o.   MRN: [id number]  HPI  Pt presents to the clinic today with c/o left wrist pain after a fall that occurred this morning. She slipped and fell on her bottom, but tried to catch herself with her left hand. Now she is c/o pain with certain movements. She has not noticed any swelling or bruising. It does bother her when she tries to lift things with that hand. She denies numbness or tingling. She has not taken anything OTC for her symptoms.  Review of Systems      Past Medical History:  Diagnosis Date  . Allergy   . Arthritis   . Dysrhythmia   . Edema    ANKLES  . GERD (gastroesophageal reflux disease)   . HOH (hard of hearing)    AIDS  . Hyperlipidemia   . Hypertension   . Hypothyroidism   . Murmur   . Neuropathy    FEET  . Osteopenia    never on treatment as of 2013  . Thyroid disease    Hypothyroidism    Current Outpatient Medications  Medication Sig Dispense Refill  . amoxicillin (AMOXIL) 500 MG tablet 2 grams prior to dental appointments.    Marland Kitchen atorvastatin (LIPITOR) 10 MG tablet Take 1 tablet (10 mg total) by mouth daily. 90 tablet 3  . Cholecalciferol (VITAMIN D3) 1000 units CAPS Take 1 capsule by mouth daily.    . Coenzyme Q10 (COQ10) 200 MG CAPS Take 1 capsule by mouth daily.    . hydroxypropyl methylcellulose (ISOPTO TEARS) 2.5 % ophthalmic solution 1 drop 3 (three) times daily as needed.    . loratadine (CLARITIN) 10 MG tablet Take 10 mg by mouth daily as needed for allergies.    . Multiple Vitamin (MULTIVITAMIN) tablet Take 1 tablet by mouth daily.    . naproxen sodium (ANAPROX) 220 MG tablet Take 220 mg by mouth as needed.    Marland Kitchen oxymetazoline (AFRIN) 0.05 % nasal spray Place 2 sprays into the nose 2 (two) times daily as needed for congestion.    . simethicone (MYLICON) 678 MG chewable tablet Chew 125 mg by mouth every 6 (six) hours as needed for flatulence.    . sodium chloride  (OCEAN) 0.65 % nasal spray Place 1 spray into the nose as needed.    Marland Kitchen SYNTHROID 100 MCG tablet Take 1 tablet (100 mcg total) by mouth daily. 90 tablet 3  . vitamin B-12 (CYANOCOBALAMIN) 1000 MCG tablet Take 1 tablet (1,000 mcg total) by mouth daily.     No current facility-administered medications for this visit.     Allergies  Allergen Reactions  . Clindamycin     REACTION: Upset stomach and diarrhea    Family History  Problem Relation Age of Onset  . Hypertension Mother   . Heart disease Mother        CHF, MI  . Stroke Father   . Heart disease Father        CHF  . Hypertension Father   . Heart disease Paternal Grandmother        MI  . Obesity Paternal Grandmother   . Cystic fibrosis Other        3 died as infants  . Cancer Paternal Aunt 61       Breast  . Breast cancer Paternal Aunt   . Stroke Maternal Grandmother 80  . Cancer Maternal Grandfather  Lung (smoker)  . Hypertension Other   . Colon cancer Neg Hx     Social History   Socioeconomic History  . Marital status: Married    Spouse name: Not on file  . Number of children: 2  . Years of education: Not on file  . Highest education level: Not on file  Social Needs  . Financial resource strain: Not on file  . Food insecurity - worry: Not on file  . Food insecurity - inability: Not on file  . Transportation needs - medical: Not on file  . Transportation needs - non-medical: Not on file  Occupational History  . Occupation: Public affairs consultant in past    Employer: RETIRED  . Occupation: Income Tax, part-time  Tobacco Use  . Smoking status: Never Smoker  . Smokeless tobacco: Never Used  Substance and Sexual Activity  . Alcohol use: Yes    Comment: Occasional wine  . Drug use: No  . Sexual activity: No  Other Topics Concern  . Not on file  Social History Narrative   Married 1964, lives with husband, 2 adult children (one may have schizophrenia)   Former Engineer, building services, retired from tax  work   Enjoys Recruitment consultant     Constitutional: Denies fever, malaise, fatigue, headache or abrupt weight changes.  Musculoskeletal: Pt reports left wrist pain. Denies difficulty with gait, muscle pain or joint swelling.  Skin: Denies redness, rashes, lesions or ulcercations.    No other specific complaints in a complete review of systems (except as listed in HPI above).  Objective:   Physical Exam    BP (!) 146/88   Pulse 76   Temp 98.2 F (36.8 C) (Oral)   Wt 154 lb (69.9 kg)   SpO2 98%   BMI 27.28 kg/m  Wt Readings from Last 3 Encounters:  04/27/17 154 lb (69.9 kg)  07/10/16 152 lb (68.9 kg)  06/30/16 152 lb (68.9 kg)    General: Appears her stated age, well developed, well nourished in NAD. Skin: No bruising or swelling of the left wrist noted. Musculoskeletal: pain with flexion and extreme extension. Pain with palpation over the carpals, but no swelling noted. No pain with palpation of the radius or ulna. No pain with palpation over the metacarpals. Hand grips equal.  Neurological: Alert and oriented. Sensation intact to BLE.   BMET    Component Value Date/Time   NA 140 07/01/2016 0812   K 4.3 07/01/2016 0812   CL 104 07/01/2016 0812   CO2 31 07/01/2016 0812   GLUCOSE 95 07/01/2016 0812   BUN 21 07/01/2016 0812   CREATININE 0.94 07/01/2016 0812   CALCIUM 9.7 07/01/2016 0812   GFRNONAA 71.49 06/22/2009 0853   GFRAA 70 03/23/2008 1045    Lipid Panel     Component Value Date/Time   CHOL 175 07/01/2016 0812   TRIG 95.0 07/01/2016 0812   HDL 62.40 07/01/2016 0812   CHOLHDL 3 07/01/2016 0812   VLDL 19.0 07/01/2016 0812   LDLCALC 93 07/01/2016 0812    CBC    Component Value Date/Time   WBC 7.8 04/04/2010 0817   RBC 4.88 04/04/2010 0817   HGB 15.3 (H) 04/04/2010 0817   HCT 44.7 04/04/2010 0817   PLT 254.0 04/04/2010 0817   MCV 91.6 04/04/2010 0817   MCHC 34.3 04/04/2010 0817   RDW 14.1 04/04/2010 0817   LYMPHSABS 2.5 04/04/2010 0817    MONOABS 0.7 04/04/2010 0817   EOSABS 0.2 04/04/2010 0817   BASOSABS  0.0 04/04/2010 0817    Hgb A1C No results found for: HGBA1C        Assessment & Plan:   Left Wrist Pain:  Xray to r/o fracture Discussed RICE therapy Left wrist wrapped in ACE wrap  Will follow up after xray, return precautions discussed Webb Silversmith, NP

## 2017-04-28 ENCOUNTER — Other Ambulatory Visit: Payer: Self-pay | Admitting: Internal Medicine

## 2017-04-28 DIAGNOSIS — S52502A Unspecified fracture of the lower end of left radius, initial encounter for closed fracture: Secondary | ICD-10-CM | POA: Diagnosis not present

## 2017-04-28 DIAGNOSIS — S52572A Other intraarticular fracture of lower end of left radius, initial encounter for closed fracture: Secondary | ICD-10-CM

## 2017-04-28 NOTE — Progress Notes (Signed)
mb ref to  

## 2017-05-01 DIAGNOSIS — H26491 Other secondary cataract, right eye: Secondary | ICD-10-CM | POA: Diagnosis not present

## 2017-05-07 DIAGNOSIS — S52502D Unspecified fracture of the lower end of left radius, subsequent encounter for closed fracture with routine healing: Secondary | ICD-10-CM | POA: Diagnosis not present

## 2017-05-15 ENCOUNTER — Other Ambulatory Visit: Payer: Self-pay | Admitting: Family Medicine

## 2017-05-15 DIAGNOSIS — Z1231 Encounter for screening mammogram for malignant neoplasm of breast: Secondary | ICD-10-CM

## 2017-06-11 DIAGNOSIS — S52502D Unspecified fracture of the lower end of left radius, subsequent encounter for closed fracture with routine healing: Secondary | ICD-10-CM | POA: Diagnosis not present

## 2017-06-16 DIAGNOSIS — S52502D Unspecified fracture of the lower end of left radius, subsequent encounter for closed fracture with routine healing: Secondary | ICD-10-CM | POA: Diagnosis not present

## 2017-06-16 DIAGNOSIS — M25532 Pain in left wrist: Secondary | ICD-10-CM | POA: Diagnosis not present

## 2017-06-16 DIAGNOSIS — M25632 Stiffness of left wrist, not elsewhere classified: Secondary | ICD-10-CM | POA: Diagnosis not present

## 2017-06-17 HISTORY — PX: SKIN CANCER DESTRUCTION: SHX778

## 2017-06-30 ENCOUNTER — Ambulatory Visit
Admission: RE | Admit: 2017-06-30 | Discharge: 2017-06-30 | Disposition: A | Discharge status: Home / Self Care | Payer: Medicare Other | Source: Ambulatory Visit | Attending: Family Medicine | Admitting: Family Medicine

## 2017-06-30 DIAGNOSIS — Z1231 Encounter for screening mammogram for malignant neoplasm of breast: Secondary | ICD-10-CM | POA: Diagnosis not present

## 2017-07-08 DIAGNOSIS — X32XXXA Exposure to sunlight, initial encounter: Secondary | ICD-10-CM | POA: Diagnosis not present

## 2017-07-08 DIAGNOSIS — L570 Actinic keratosis: Secondary | ICD-10-CM | POA: Diagnosis not present

## 2017-07-08 DIAGNOSIS — L821 Other seborrheic keratosis: Secondary | ICD-10-CM | POA: Diagnosis not present

## 2017-07-09 ENCOUNTER — Ambulatory Visit: Payer: Medicare Other

## 2017-07-09 ENCOUNTER — Other Ambulatory Visit: Payer: Self-pay | Admitting: Family Medicine

## 2017-07-09 ENCOUNTER — Ambulatory Visit (INDEPENDENT_AMBULATORY_CARE_PROVIDER_SITE_OTHER): Payer: Medicare Other

## 2017-07-09 DIAGNOSIS — E785 Hyperlipidemia, unspecified: Secondary | ICD-10-CM

## 2017-07-09 DIAGNOSIS — E039 Hypothyroidism, unspecified: Secondary | ICD-10-CM

## 2017-07-09 DIAGNOSIS — E559 Vitamin D deficiency, unspecified: Secondary | ICD-10-CM | POA: Diagnosis not present

## 2017-07-09 LAB — LIPID PANEL
CHOL/HDL RATIO: 3
Cholesterol: 192 mg/dL (ref 0–200)
HDL: 61.9 mg/dL (ref >39.00)
LDL CALC: 106 mg/dL — AB (ref 0–99)
NONHDL: 129.79
Triglycerides: 119 mg/dL (ref 0.0–149.0)
VLDL: 23.8 mg/dL (ref 0.0–40.0)

## 2017-07-09 LAB — COMPREHENSIVE METABOLIC PANEL
ALT: 18 U/L (ref 0–35)
AST: 24 U/L (ref 0–37)
Albumin: 4.2 g/dL (ref 3.5–5.2)
Alkaline Phosphatase: 78 U/L (ref 39–117)
BUN: 20 mg/dL (ref 6–23)
CHLORIDE: 102 meq/L (ref 96–112)
CO2: 30 meq/L (ref 19–32)
Calcium: 10 mg/dL (ref 8.4–10.5)
Creatinine, Ser: 0.89 mg/dL (ref 0.40–1.20)
GFR: 64.58 mL/min (ref >60.00)
Glucose, Bld: 105 mg/dL — ABNORMAL HIGH (ref 70–99)
Potassium: 4.8 mEq/L (ref 3.5–5.1)
SODIUM: 140 meq/L (ref 135–145)
Total Bilirubin: 0.7 mg/dL (ref 0.2–1.2)
Total Protein: 7.2 g/dL (ref 6.0–8.3)

## 2017-07-09 LAB — VITAMIN D 25 HYDROXY (VIT D DEFICIENCY, FRACTURES): VITD: 29.21 ng/mL — ABNORMAL LOW (ref 30.00–100.00)

## 2017-07-09 LAB — TSH: TSH: 1.17 u[IU]/mL (ref 0.35–4.50)

## 2017-07-09 NOTE — Patient Instructions (Signed)
Diane Tucker , Thank you for taking time to come for your Medicare Wellness Visit. I appreciate your ongoing commitment to your health goals. Please review the following plan we discussed and let me know if I can assist you in the future.   These are the goals we discussed: Goals    . Increase physical activity     Starting 07/09/2017, I will continue to exercise at least 15 min 4-5 days per week.        This is a list of the screening recommended for you and due dates:  Health Maintenance  Topic Date Due  . Flu Shot  09/17/2017  . Tetanus Vaccine  03/28/2018  . Mammogram  07/01/2018  . DEXA scan (bone density measurement)  Completed  . Pneumonia vaccines  Completed   Preventive Care for Adults  A healthy lifestyle and preventive care can promote health and wellness. Preventive health guidelines for adults include the following key practices.  . A routine yearly physical is a good way to check with your health care provider about your health and preventive screening. It is a chance to share any concerns and updates on your health and to receive a thorough exam.  . Visit your dentist for a routine exam and preventive care every 6 months. Brush your teeth twice a day and floss once a day. Good oral hygiene prevents tooth decay and gum disease.  . The frequency of eye exams is based on your age, health, family medical history, use  of contact lenses, and other factors. Follow your health care provider's recommendations for frequency of eye exams.  . Eat a healthy diet. Foods like vegetables, fruits, whole grains, low-fat dairy products, and lean protein foods contain the nutrients you need without too many calories. Decrease your intake of foods high in solid fats, added sugars, and salt. Eat the right amount of calories for you. Get information about a proper diet from your health care provider, if necessary.  . Regular physical exercise is one of the most important things you can do for  your health. Most adults should get at least 150 minutes of moderate-intensity exercise (any activity that increases your heart rate and causes you to sweat) each week. In addition, most adults need muscle-strengthening exercises on 2 or more days a week.  Silver Sneakers may be a benefit available to you. To determine eligibility, you may visit the website: www.silversneakers.com or contact program at (385)569-0075 Mon-Fri between 8AM-8PM.   . Maintain a healthy weight. The body mass index (BMI) is a screening tool to identify possible weight problems. It provides an estimate of body fat based on height and weight. Your health care provider can find your BMI and can help you achieve or maintain a healthy weight.   For adults 20 years and older: ? A BMI below 18.5 is considered underweight. ? A BMI of 18.5 to 24.9 is normal. ? A BMI of 25 to 29.9 is considered overweight. ? A BMI of 30 and above is considered obese.   . Maintain normal blood lipids and cholesterol levels by exercising and minimizing your intake of saturated fat. Eat a balanced diet with plenty of fruit and vegetables. Blood tests for lipids and cholesterol should begin at age 65 and be repeated every 5 years. If your lipid or cholesterol levels are high, you are over 50, or you are at high risk for heart disease, you may need your cholesterol levels checked more frequently. Ongoing high lipid and  cholesterol levels should be treated with medicines if diet and exercise are not working.  . If you smoke, find out from your health care provider how to quit. If you do not use tobacco, please do not start.  . If you choose to drink alcohol, please do not consume more than 2 drinks per day. One drink is considered to be 12 ounces (355 mL) of beer, 5 ounces (148 mL) of wine, or 1.5 ounces (44 mL) of liquor.  . If you are 47-33 years old, ask your health care provider if you should take aspirin to prevent strokes.  . Use sunscreen.  Apply sunscreen liberally and repeatedly throughout the day. You should seek shade when your shadow is shorter than you. Protect yourself by wearing long sleeves, pants, a wide-brimmed hat, and sunglasses year round, whenever you are outdoors.  . Once a month, do a whole body skin exam, using a mirror to look at the skin on your back. Tell your health care provider of new moles, moles that have irregular borders, moles that are larger than a pencil eraser, or moles that have changed in shape or color.

## 2017-07-13 NOTE — Progress Notes (Signed)
PCP notes:   Health maintenance:  No gaps identified.   Abnormal screenings:   Fall risk - hx of multiple falls Fall Risk  07/09/2017 06/30/2016 06/28/2015 06/29/2014 06/24/2013  Falls in the past year? Yes Yes No No Yes  Comment 2 falls related to loss of balance; wrist fracture with last fall in Mar 19 pt fell while walking in woods; no medical treatment - - -  Number falls in past yr: 2 or more 1 - - 1  Injury with Fall? Yes Yes - - -  Risk Factor Category  High Fall Risk - - - -  Risk for fall due to : Impaired balance/gait - - - -    Patient concerns:   Urinary incontinence - wears pad when going out  Nurse concerns:  None  Next PCP appt:   07/14/2017 @ 0945

## 2017-07-13 NOTE — Progress Notes (Signed)
Subjective:   Diane Tucker is a 84 y.o. female who presents for Medicare Annual (Subsequent) preventive examination.  Review of Systems:  N/A Cardiac Risk Factors include: advanced age (>29men, >54 women);dyslipidemia;hypertension     Objective:     Vitals: BP (!) 142/82 (BP Location: Right Arm, Patient Position: Sitting, Cuff Size: Normal)   Pulse 88   Temp 98.6 F (37 C) (Oral)   Ht 5' 2.5" (1.588 m) Comment: no shoes  Wt 151 lb 12 oz (68.8 kg)   SpO2 94%   BMI 27.31 kg/m   Body mass index is 27.31 kg/m.  Advanced Directives 07/09/2017 06/30/2016 06/28/2015  Does Patient Have a Medical Advance Directive? Yes Yes Yes  Type of Paramedic of [place];Living will [place];Living will [place];Living will  Does patient want to make changes to medical advance directive? - - No - Patient declined  Copy of [place] in Chart? Yes No - copy requested No - copy requested    Tobacco Social History   Tobacco Use  Smoking Status Never Smoker  Smokeless Tobacco Never Used     Counseling given: No   Clinical Intake:  Pre-visit preparation completed: Yes  Pain : No/denies pain Pain Score: 0-No pain     Nutritional Status: BMI 25 -29 Overweight Nutritional Risks: None Diabetes: No  How often do you need to have someone help you when you read instructions, pamphlets, or other written materials from your doctor or pharmacy?: 1 - Never What is the last grade level you completed in school?: Masters degree  Interpreter Needed?: No  Comments: pt lives with spouse Information entered by :: LPinson, LPN  Past Medical History:  Diagnosis Date  . Allergy   . Arthritis   . Dysrhythmia   . Edema    ANKLES  . GERD (gastroesophageal reflux disease)   . HOH (hard of hearing)    AIDS  . Hyperlipidemia   . Hypertension   . Hypothyroidism   . Murmur   . Neuropathy    FEET  . Osteopenia    never on treatment as of 2013  . Thyroid disease    Hypothyroidism   Past Surgical History:  Procedure Laterality Date  . CATARACT EXTRACTION W/PHACO Right 08/27/2015   Procedure: CATARACT EXTRACTION PHACO AND INTRAOCULAR LENS PLACEMENT (IOC);  Surgeon: Estill Cotta, MD;  Location: [facility] ORS;  Service: Ophthalmology;  Laterality: Right;  [place]  01:22 AP%24.0 CDE 34.56 fluid pack lot # [id number]  . DOPPLER ECHOCARDIOGRAPHY  3/96   MVP with MR (TR) - prophylaxis  . EYE SURGERY    . Holter Monitor     NSR, freq. PVC's, rare colp, freq. PAC's  . NSVD     X 2  . Rheumatic Fever     (?) as a child. Hosp, prolonged fever for several days.  Marland Kitchen SKIN CANCER DESTRUCTION  06/2017   Dr. Kellie Moor  . TONSILLECTOMY     As a child   Family History  Problem Relation Age of Onset  . Hypertension Mother   . Heart disease Mother        CHF, MI  . Stroke Father   . Heart disease Father        CHF  . Hypertension Father   . Heart disease Paternal Grandmother        MI  . Obesity Paternal Grandmother   . Cystic fibrosis Other  3 died as infants  . Cancer Paternal Aunt 60       Breast  . Breast cancer Paternal Aunt   . Stroke Maternal Grandmother 80  . Cancer Maternal Grandfather        Lung (smoker)  . Hypertension Other   . Colon cancer Neg Hx    Social History   Socioeconomic History  . Marital status: Married    Spouse name: Not on file  . Number of children: 2  . Years of education: Not on file  . Highest education level: Not on file  Occupational History  . Occupation: Public affairs consultant in past    Employer: RETIRED  . Occupation: Income Tax, part-time  Social Needs  . Financial resource strain: Not on file  . Food insecurity:    Worry: Not on file    Inability: Not on file  . Transportation needs:    Medical: Not on file    Non-medical: Not on file  Tobacco Use  . Smoking status: Never Smoker  . Smokeless tobacco: Never Used  Substance and Sexual Activity  .  Alcohol use: Yes    Comment: Occasional wine  . Drug use: No  . Sexual activity: Never  Lifestyle  . Physical activity:    Days per week: Not on file    Minutes per session: Not on file  . Stress: Not on file  Relationships  . Social connections:    Talks on phone: Not on file    Gets together: Not on file    Attends religious service: Not on file    Active member of club or organization: Not on file    Attends meetings of clubs or organizations: Not on file    Relationship status: Not on file  Other Topics Concern  . Not on file  Social History Narrative   Married 1964, lives with husband, 2 adult children (one may have schizophrenia)   Former Engineer, building services, retired from tax work   Enjoys Recruitment consultant    Outpatient Encounter Medications as of 07/09/2017  Medication Sig  . amoxicillin (AMOXIL) 500 MG tablet 2 grams prior to dental appointments.  Marland Kitchen atorvastatin (LIPITOR) 10 MG tablet Take 1 tablet (10 mg total) by mouth daily.  . Cholecalciferol (VITAMIN D3) 1000 units CAPS Take 1 capsule by mouth daily.  . Coenzyme Q10 (COQ10) 200 MG CAPS Take 1 capsule by mouth daily.  . hydroxypropyl methylcellulose (ISOPTO TEARS) 2.5 % ophthalmic solution 1 drop 3 (three) times daily as needed.  . loratadine (CLARITIN) 10 MG tablet Take 10 mg by mouth daily as needed for allergies.  . Multiple Vitamin (MULTIVITAMIN) tablet Take 1 tablet by mouth daily.  . naproxen sodium (ANAPROX) 220 MG tablet Take 220 mg by mouth as needed.  Marland Kitchen oxymetazoline (AFRIN) 0.05 % nasal spray Place 2 sprays into the nose 2 (two) times daily as needed for congestion.  . simethicone (MYLICON) 008 MG chewable tablet Chew 125 mg by mouth every 6 (six) hours as needed for flatulence.  . sodium chloride (OCEAN) 0.65 % nasal spray Place 1 spray into the nose as needed.  Marland Kitchen SYNTHROID 100 MCG tablet Take 1 tablet (100 mcg total) by mouth daily.  . vitamin B-12 (CYANOCOBALAMIN) 1000 MCG tablet Take 1 tablet  (1,000 mcg total) by mouth daily.   No facility-administered encounter medications on file as of 07/09/2017.     Activities of Daily Living In your present state of health, do you have any difficulty  performing the following activities: 07/09/2017  Hearing? Y  Vision? N  Difficulty concentrating or making decisions? Y  Walking or climbing stairs? N  Dressing or bathing? N  Doing errands, shopping? N  Preparing Food and eating ? N  Using the Toilet? N  In the past six months, have you accidently leaked urine? Y  Do you have problems with loss of bowel control? N  Managing your Medications? N  Managing your Finances? N  Housekeeping or managing your Housekeeping? N  Some recent data might be hidden    Patient Care Team: Tonia Ghent, MD as PCP - General (Family Medicine) Dingeldein, Remo Lipps, MD as Consulting Physician (Ophthalmology) Oneta Rack, MD as Consulting Physician (Dermatology) Kipp Laurence, MD as Referring Physician (Audiology) Sherwood Gambler, DDS as Consulting Physician (Dentistry)    Assessment:   This is a routine wellness examination for Diane Tucker.  Hearing Screening Comments: Bilateral hearing aids Vision Screening Comments: March 2019 with Dr. Sandra Cockayne  Exercise Activities and Dietary recommendations Current Exercise Habits: Home exercise routine, Type of exercise: walking;Other - see comments(bowling, kayaking), Time (Minutes): 15, Frequency (Times/Week): 4, Weekly Exercise (Minutes/Week): 60, Intensity: Mild, Exercise limited by: None identified  Goals    . Increase physical activity     Starting 07/09/2017, I will continue to exercise at least 15 min 4-5 days per week.        Fall Risk Fall Risk  07/09/2017 06/30/2016 06/28/2015 06/29/2014 06/24/2013  Falls in the past year? Yes Yes No No Yes  Comment 2 falls related to loss of balance; wrist fracture with last fall in Mar 19 pt fell while walking in woods; no medical treatment - - -  Number falls  in past yr: 2 or more 1 - - 1  Injury with Fall? Yes Yes - - -  Risk Factor Category  High Fall Risk - - - -  Risk for fall due to : Impaired balance/gait - - - -   Depression Screen PHQ 2/9 Scores 07/09/2017 06/30/2016 06/28/2015 06/29/2014  PHQ - 2 Score 0 0 0 0  PHQ- 9 Score 0 - - -     Cognitive Function MMSE - Mini Mental State Exam 07/09/2017 06/30/2016 06/28/2015  Orientation to time 5 5 5   Orientation to Place 5 5 5   Registration 3 3 3   Attention/ Calculation 0 0 0  Recall 3 3 3   Language- name 2 objects 0 0 0  Language- repeat 1 1 1   Language- follow 3 step command 3 3 3   Language- read & follow direction 0 0 0  Write a sentence 0 0 0  Copy design 0 0 0  Total score 20 20 20      PLEASE NOTE: A Mini-Cog screen was completed. Maximum score is 20. A value of 0 denotes this part of [place] MMSE was not completed or the patient failed this part of the Mini-Cog screening.   Mini-Cog Screening Orientation to Time - Max 5 pts Orientation to Place - Max 5 pts Registration - Max 3 pts Recall - Max 3 pts Language Repeat - Max 1 pts Language Follow 3 Step Command - Max 3 pts     Immunization History  Administered Date(s) Administered  . Influenza Whole 11/17/2005, 12/16/2007, 11/17/2009  . Influenza, High Dose Seasonal PF 11/22/2014  . Influenza-Unspecified 11/17/2013, 11/18/2015, 11/18/2016  . Pneumococcal Conjugate-13 06/29/2014  . Pneumococcal Polysaccharide-23 03/02/2002  . Td 02/18/1996, 03/28/2008  . Zoster 07/03/2015   Screening Tests Health Maintenance  Topic Date Due  . INFLUENZA VACCINE  09/17/2017  . TETANUS/TDAP  03/28/2018  . MAMMOGRAM  07/01/2018  . DEXA SCAN  Completed  . PNA vac Low Risk Adult  Completed       Plan:     I have personally reviewed, addressed, and noted the following in the patient's chart:  A. Medical and social history B. Use of alcohol, tobacco or illicit drugs  C. Current medications and supplements D. Functional ability and  status E.  Nutritional status F.  Physical activity G. Advance directives H. List of other physicians I.  Hospitalizations, surgeries, and ER visits in previous 12 months J.  [place] to include hearing, vision, cognitive, depression L. Referrals and appointments - none  In addition, I have reviewed and discussed with patient certain preventive protocols, quality metrics, and best practice recommendations. A written personalized care plan for preventive services as well as general preventive health recommendations were provided to patient.  See attached scanned questionnaire for additional information.   Signed,   Lindell Noe, MHA, BS, LPN Health Coach

## 2017-07-14 ENCOUNTER — Ambulatory Visit (INDEPENDENT_AMBULATORY_CARE_PROVIDER_SITE_OTHER): Payer: Medicare Other | Admitting: Family Medicine

## 2017-07-14 ENCOUNTER — Other Ambulatory Visit: Payer: Self-pay | Admitting: Family Medicine

## 2017-07-14 ENCOUNTER — Encounter: Payer: Self-pay | Admitting: Family Medicine

## 2017-07-14 VITALS — BP 158/72 | HR 79 | Temp 98.6°F | Ht 62.5 in | Wt 154.2 lb

## 2017-07-14 DIAGNOSIS — E785 Hyperlipidemia, unspecified: Secondary | ICD-10-CM

## 2017-07-14 DIAGNOSIS — G609 Hereditary and idiopathic neuropathy, unspecified: Secondary | ICD-10-CM | POA: Diagnosis not present

## 2017-07-14 DIAGNOSIS — S52502D Unspecified fracture of the lower end of left radius, subsequent encounter for closed fracture with routine healing: Secondary | ICD-10-CM | POA: Diagnosis not present

## 2017-07-14 DIAGNOSIS — M8580 Other specified disorders of bone density and structure, unspecified site: Secondary | ICD-10-CM | POA: Diagnosis not present

## 2017-07-14 DIAGNOSIS — Z636 Dependent relative needing care at home: Secondary | ICD-10-CM

## 2017-07-14 DIAGNOSIS — E038 Other specified hypothyroidism: Secondary | ICD-10-CM

## 2017-07-14 DIAGNOSIS — R030 Elevated blood-pressure reading, without diagnosis of hypertension: Secondary | ICD-10-CM | POA: Diagnosis not present

## 2017-07-14 DIAGNOSIS — E559 Vitamin D deficiency, unspecified: Secondary | ICD-10-CM | POA: Diagnosis not present

## 2017-07-14 DIAGNOSIS — Z0000 Encounter for general adult medical examination without abnormal findings: Secondary | ICD-10-CM

## 2017-07-14 DIAGNOSIS — M899 Disorder of bone, unspecified: Secondary | ICD-10-CM

## 2017-07-14 NOTE — Progress Notes (Signed)
BP is normal at home, slightly elevated SBP today at OV.  She likely has white coat response.  D/w pt about getting her BP cuff calibrated and updating me as needed.  She agrees.    Vit D def.  Nearly wnl with occ missed doses.  D/w pt. likely reasonable to continue as is and with slightly better med adherence her level would be reasonable.  Recheck bone density pending.  Discussed with patient.  She agrees.  Elevated Cholesterol: Using medications without problems:yes Muscle aches: no Diet compliance: encouraged.  Exercise: encouraged Labs d/w pt.    Hypothyroidism. Compliant.  No ADE on med.  No neck mass.  Labs d/w pt.    She had likely neuropathy in feet at baseline.  Some occ paresthesia.Prickly feeling in the B feet, going on for years intermittently.  Still with normal sensation, can feel the floor walking.  No pain but she has some stiffness in the feet.  We talked about balance exercises and routine fall precautions at baseline.  Mammogram 2019 Colon cancer screening deferred given age, d/w pt.   Advance directive- husband designated if patient were incapacitated.  She has caregiver strain with her son, at baseline.  He is living in their vacation home and they are still trying to get him evaluated.  D/w pt.  She is safe at home.    Urinary incontinence d/w pt.  She is managing with urination preemptively along with a pad.   Discussed with patient abou pelvic floor/Kegel exercises.  She has ortho f/u re: L wrist fx.  D/w pt about f/u DXA.  Ordered.  Fall cautions d/w pt.  D/w pt about balance exercises.   PMH and SH reviewed  ROS: Per HPI unless specifically indicated in ROS section   Meds, vitals, and allergies reviewed.   GEN: nad, alert and oriented HEENT: mucous membranes moist NECK: supple w/o LA CV: rrr PULM: ctab, no inc wob ABD: soft, +bs EXT: no edema SKIN: no acute rash  Diabetic foot exam: Normal inspection No skin breakdown No calluses  Normal DP  pulses Normal sensation to light touch and monofilament Nails normal

## 2017-07-14 NOTE — Patient Instructions (Addendum)
We will call about your referral for the bone density test.  Rosaria Ferries or Azalee Course will call you if you don't see one of them on the way out.  Let the pharmacy know if/when you need refills.   Take care.  Glad to see you.

## 2017-07-14 NOTE — Progress Notes (Signed)
I reviewed health advisor's note, was available for consultation, and agree with documentation and plan.  

## 2017-07-14 NOTE — Progress Notes (Signed)
No date entry.

## 2017-07-15 NOTE — Assessment & Plan Note (Signed)
Continue statin.  Continue work on diet and exercise.  She agrees.  Labs discussed with patient.

## 2017-07-15 NOTE — Assessment & Plan Note (Signed)
Likely white coat response.  She can calibrate her cuff at home.  Recheck at home and update me as needed.  She agrees.

## 2017-07-15 NOTE — Assessment & Plan Note (Signed)
Compliant.  No ADE on med.  No neck mass.  Labs d/w pt.   continue as is. >25 minutes spent in face to face time with patient, >50% spent in counselling or coordination of care, discussing hypothyroidism, lipids, vitamin D, blood pressure, etc.

## 2017-07-15 NOTE — Assessment & Plan Note (Signed)
Nearly wnl with occ missed doses.  D/w pt. likely reasonable to continue as is and with slightly better med adherence her level would be reasonable.

## 2017-07-15 NOTE — Assessment & Plan Note (Signed)
See above

## 2017-07-15 NOTE — Assessment & Plan Note (Signed)
Mammogram 2019 Colon cancer screening deferred given age, d/w pt.   Advance directive- husband designated if patient were incapacitated.  She has caregiver strain with her son, at baseline.  He is living in their vacation home and they are still trying to get him evaluated.  D/w pt.  She is safe at home.

## 2017-07-15 NOTE — Assessment & Plan Note (Signed)
Continue as is with routine fall cautions.  See foot exam above.  Update me as needed.  She agrees.

## 2017-07-24 ENCOUNTER — Other Ambulatory Visit: Payer: Self-pay | Admitting: Family Medicine

## 2017-08-26 ENCOUNTER — Ambulatory Visit
Admission: RE | Admit: 2017-08-26 | Discharge: 2017-08-26 | Disposition: A | Discharge status: Home / Self Care | Payer: Medicare Other | Source: Ambulatory Visit | Attending: Family Medicine | Admitting: Family Medicine

## 2017-08-26 DIAGNOSIS — Z780 Asymptomatic menopausal state: Secondary | ICD-10-CM | POA: Diagnosis not present

## 2017-08-26 DIAGNOSIS — M8580 Other specified disorders of bone density and structure, unspecified site: Secondary | ICD-10-CM

## 2017-08-26 DIAGNOSIS — M85851 Other specified disorders of bone density and structure, right thigh: Secondary | ICD-10-CM | POA: Diagnosis not present

## 2017-09-04 ENCOUNTER — Ambulatory Visit: Payer: Medicare Other | Admitting: Family Medicine

## 2017-09-18 ENCOUNTER — Encounter: Payer: Self-pay | Admitting: Family Medicine

## 2017-09-18 ENCOUNTER — Ambulatory Visit (INDEPENDENT_AMBULATORY_CARE_PROVIDER_SITE_OTHER): Payer: Medicare Other | Admitting: Family Medicine

## 2017-09-18 VITALS — BP 164/74 | HR 76 | Temp 98.6°F | Ht 62.5 in | Wt 154.5 lb

## 2017-09-18 DIAGNOSIS — M8580 Other specified disorders of bone density and structure, unspecified site: Secondary | ICD-10-CM

## 2017-09-18 NOTE — Progress Notes (Signed)
D/w pt about DXA results and osteoporosis path/phys in general, including vit D and calcium.  No prev tx for osteopenia.   Reasonable to consider treatment with bisphosphonate, ie fosamax.  D/w pt about risk benefit, especially GI sx, jaw and long bone pathology.   She'll consider and update me as needed.   She has dental work planned so it would make sense to hold off for now.    Meds, vitals, and allergies reviewed.   ROS: Per HPI unless specifically indicated in ROS section   nad ncat Remainder of exam deferred.

## 2017-09-18 NOTE — Patient Instructions (Addendum)
After your dental work is addressed, then a medicine like fosamax may be reasonable to use.  Update me as needed.  Ask the dentist about possible use.  Keep taking vitamin D with calcium.   Take care.  Glad to see you.

## 2017-09-19 NOTE — Assessment & Plan Note (Signed)
D/w pt about DXA results and osteoporosis path/phys in general, including vit D and calcium.  No prev tx for osteopenia.   Reasonable to consider treatment with bisphosphonate, ie fosamax.  D/w pt about risk benefit, especially GI sx, jaw and long bone pathology.   She has dental work planned so it would make sense to hold off for now.  She'll consider and update me as needed, after she is talked to her dentist and had any needed dental work done. She'll keep taking vitamin D with calcium.  >15 minutes spent in face to face time with patient, >50% spent in counselling or coordination of care.

## 2017-12-08 DIAGNOSIS — Z23 Encounter for immunization: Secondary | ICD-10-CM | POA: Diagnosis not present

## 2018-02-17 HISTORY — PX: TOOTH EXTRACTION: SUR596

## 2018-06-22 ENCOUNTER — Other Ambulatory Visit: Payer: Self-pay | Admitting: Family Medicine

## 2018-06-24 ENCOUNTER — Other Ambulatory Visit: Payer: Self-pay | Admitting: Family Medicine

## 2018-06-24 DIAGNOSIS — Z1231 Encounter for screening mammogram for malignant neoplasm of breast: Secondary | ICD-10-CM

## 2018-06-29 DIAGNOSIS — Z961 Presence of intraocular lens: Secondary | ICD-10-CM | POA: Diagnosis not present

## 2018-07-05 ENCOUNTER — Other Ambulatory Visit: Payer: Self-pay | Admitting: Family Medicine

## 2018-07-05 DIAGNOSIS — E785 Hyperlipidemia, unspecified: Secondary | ICD-10-CM

## 2018-07-05 DIAGNOSIS — E039 Hypothyroidism, unspecified: Secondary | ICD-10-CM

## 2018-07-05 DIAGNOSIS — E559 Vitamin D deficiency, unspecified: Secondary | ICD-10-CM

## 2018-07-08 ENCOUNTER — Other Ambulatory Visit (INDEPENDENT_AMBULATORY_CARE_PROVIDER_SITE_OTHER): Payer: Medicare Other

## 2018-07-08 ENCOUNTER — Other Ambulatory Visit: Payer: Medicare Other

## 2018-07-08 DIAGNOSIS — E559 Vitamin D deficiency, unspecified: Secondary | ICD-10-CM | POA: Diagnosis not present

## 2018-07-08 DIAGNOSIS — E039 Hypothyroidism, unspecified: Secondary | ICD-10-CM | POA: Diagnosis not present

## 2018-07-08 DIAGNOSIS — E785 Hyperlipidemia, unspecified: Secondary | ICD-10-CM | POA: Diagnosis not present

## 2018-07-08 LAB — COMPREHENSIVE METABOLIC PANEL
ALT: 14 U/L (ref 0–35)
AST: 21 U/L (ref 0–37)
Albumin: 4.1 g/dL (ref 3.5–5.2)
Alkaline Phosphatase: 72 U/L (ref 39–117)
BUN: 16 mg/dL (ref 6–23)
CO2: 29 mEq/L (ref 19–32)
Calcium: 9.6 mg/dL (ref 8.4–10.5)
Chloride: 102 mEq/L (ref 96–112)
Creatinine, Ser: 0.84 mg/dL (ref 0.40–1.20)
GFR: 64.8 mL/min (ref >60.00)
Glucose, Bld: 97 mg/dL (ref 70–99)
Potassium: 4.3 mEq/L (ref 3.5–5.1)
Sodium: 140 mEq/L (ref 135–145)
Total Bilirubin: 0.7 mg/dL (ref 0.2–1.2)
Total Protein: 6.9 g/dL (ref 6.0–8.3)

## 2018-07-08 LAB — LIPID PANEL
Cholesterol: 176 mg/dL (ref 0–200)
HDL: 63.8 mg/dL (ref >39.00)
LDL Cholesterol: 86 mg/dL (ref 0–99)
NonHDL: 112.38
Total CHOL/HDL Ratio: 3
Triglycerides: 130 mg/dL (ref 0.0–149.0)
VLDL: 26 mg/dL (ref 0.0–40.0)

## 2018-07-08 LAB — TSH: TSH: 0.67 u[IU]/mL (ref 0.35–4.50)

## 2018-07-08 LAB — VITAMIN D 25 HYDROXY (VIT D DEFICIENCY, FRACTURES): VITD: 25.82 ng/mL — ABNORMAL LOW (ref 30.00–100.00)

## 2018-07-15 ENCOUNTER — Ambulatory Visit (INDEPENDENT_AMBULATORY_CARE_PROVIDER_SITE_OTHER): Payer: Medicare Other

## 2018-07-15 ENCOUNTER — Ambulatory Visit (INDEPENDENT_AMBULATORY_CARE_PROVIDER_SITE_OTHER): Payer: Medicare Other | Admitting: Family Medicine

## 2018-07-15 ENCOUNTER — Other Ambulatory Visit: Payer: Self-pay

## 2018-07-15 ENCOUNTER — Encounter: Payer: Self-pay | Admitting: Family Medicine

## 2018-07-15 ENCOUNTER — Encounter: Payer: Medicare Other | Admitting: Family Medicine

## 2018-07-15 VITALS — BP 150/80 | HR 80 | Temp 98.0°F | Ht 62.5 in | Wt 147.3 lb

## 2018-07-15 DIAGNOSIS — Z0000 Encounter for general adult medical examination without abnormal findings: Secondary | ICD-10-CM

## 2018-07-15 DIAGNOSIS — E038 Other specified hypothyroidism: Secondary | ICD-10-CM | POA: Diagnosis not present

## 2018-07-15 DIAGNOSIS — M8580 Other specified disorders of bone density and structure, unspecified site: Secondary | ICD-10-CM | POA: Diagnosis not present

## 2018-07-15 DIAGNOSIS — Z7189 Other specified counseling: Secondary | ICD-10-CM

## 2018-07-15 DIAGNOSIS — R42 Dizziness and giddiness: Secondary | ICD-10-CM

## 2018-07-15 DIAGNOSIS — E785 Hyperlipidemia, unspecified: Secondary | ICD-10-CM

## 2018-07-15 MED ORDER — SYNTHROID 100 MCG PO TABS: 100.0000 ug | ORAL_TABLET | Freq: Every day | ORAL | 3 refills | Status: DC

## 2018-07-15 MED ORDER — ATORVASTATIN CALCIUM 10 MG PO TABS: 10.0000 mg | ORAL_TABLET | Freq: Every day | ORAL | 3 refills | Status: DC

## 2018-07-15 NOTE — Patient Instructions (Addendum)
Shingrix and tetanus may be cheaper at the pharmacy.   Check with your insurance.  Use the bedside exercise for BPV.  Update me as needed.  Take care.  Glad to see you.

## 2018-07-15 NOTE — Progress Notes (Signed)
Subjective:   Diane Tucker is a 85 y.o. female who presents for Medicare Annual (Subsequent) preventive examination.  Review of Systems:  N/A Cardiac Risk Factors include: advanced age (>24men, >58 women);dyslipidemia;hypertension     Objective:     Vitals: There were no vitals taken for this visit.  There is no height or weight on file to calculate BMI.  Advanced Directives 07/15/2018 07/09/2017 06/30/2016 06/28/2015  Does Patient Have a Medical Advance Directive? Yes Yes Yes Yes  Type of Paramedic of [place];Living will [place];Living will [place];Living will [place];Living will  Does patient want to make changes to medical advance directive? - - - No - Patient declined  Copy of [place] in Chart? Yes - validated most recent copy scanned in chart (See row information) Yes No - copy requested No - copy requested    Tobacco Social History   Tobacco Use  Smoking Status Never Smoker  Smokeless Tobacco Never Used     Counseling given: No   Clinical Intake:  Pre-visit preparation completed: Yes  Pain : No/denies pain Pain Score: 0-No pain     Nutritional Status: BMI 25 -29 Overweight Nutritional Risks: None Diabetes: No  How often do you need to have someone help you when you read instructions, pamphlets, or other written materials from your doctor or pharmacy?: 1 - Never What is the last grade level you completed in school?: Master degree  Interpreter Needed?: No  Comments: pt lives with spouse Information entered by :: LPinson, LPN  Past Medical History:  Diagnosis Date  . Allergy   . Arthritis   . Dysrhythmia   . Edema    ANKLES  . GERD (gastroesophageal reflux disease)   . HOH (hard of hearing)    AIDS  . Hyperlipidemia   . Hypertension   . Hypothyroidism   . Murmur   . Neuropathy    FEET  . Osteopenia    never on treatment as of 2013  .  Thyroid disease    Hypothyroidism   Past Surgical History:  Procedure Laterality Date  . CATARACT EXTRACTION W/PHACO Right 08/27/2015   Procedure: CATARACT EXTRACTION PHACO AND INTRAOCULAR LENS PLACEMENT (IOC);  Surgeon: Estill Cotta, MD;  Location: [facility] ORS;  Service: Ophthalmology;  Laterality: Right;  [place]  01:22 AP%24.0 CDE 34.56 fluid pack lot # [id number]  . DOPPLER ECHOCARDIOGRAPHY  3/96   MVP with MR (TR) - prophylaxis  . EYE SURGERY    . Holter Monitor     NSR, freq. PVC's, rare colp, freq. PAC's  . NSVD     X 2  . Rheumatic Fever     (?) as a child. Hosp, prolonged fever for several days.  Marland Kitchen SKIN CANCER DESTRUCTION  06/2017   Dr. Kellie Moor  . TONSILLECTOMY     As a child   Family History  Problem Relation Age of Onset  . Hypertension Mother   . Heart disease Mother        CHF, MI  . Stroke Father   . Heart disease Father        CHF  . Hypertension Father   . Heart disease Paternal Grandmother        MI  . Obesity Paternal Grandmother   . Cystic fibrosis Other        3 died as infants  . Cancer Paternal Aunt 63       Breast  .  Breast cancer Paternal Aunt   . Stroke Maternal Grandmother 80  . Cancer Maternal Grandfather        Lung (smoker)  . Hypertension Other   . Colon cancer Neg Hx    Social History   Socioeconomic History  . Marital status: Married    Spouse name: Not on file  . Number of children: 2  . Years of education: Not on file  . Highest education level: Not on file  Occupational History  . Occupation: Public affairs consultant in past    Employer: RETIRED  . Occupation: Income Tax, part-time  Social Needs  . Financial resource strain: Not on file  . Food insecurity:    Worry: Not on file    Inability: Not on file  . Transportation needs:    Medical: Not on file    Non-medical: Not on file  Tobacco Use  . Smoking status: Never Smoker  . Smokeless tobacco: Never Used  Substance and Sexual Activity  . Alcohol use: Yes    Alcohol/week:  1.0 - 2.0 standard drinks    Types: 1 - 2 Glasses of wine per week  . Drug use: No  . Sexual activity: Never  Lifestyle  . Physical activity:    Days per week: Not on file    Minutes per session: Not on file  . Stress: Not on file  Relationships  . Social connections:    Talks on phone: Not on file    Gets together: Not on file    Attends religious service: Not on file    Active member of club or organization: Not on file    Attends meetings of clubs or organizations: Not on file    Relationship status: Not on file  Other Topics Concern  . Not on file  Social History Narrative   Married 1964, lives with husband, 2 adult children (one may have schizophrenia)   Former Engineer, building services, retired from tax work   Enjoys Recruitment consultant    Outpatient Encounter Medications as of 07/15/2018  Medication Sig  . amoxicillin (AMOXIL) 500 MG tablet 2 grams prior to dental appointments.  Marland Kitchen atorvastatin (LIPITOR) 10 MG tablet TAKE 1 TABLET DAILY  . Cholecalciferol (VITAMIN D3) 1000 units CAPS Take 1 capsule by mouth daily.  . Coenzyme Q10 (COQ10) 200 MG CAPS Take 1 capsule by mouth daily.  . hydroxypropyl methylcellulose (ISOPTO TEARS) 2.5 % ophthalmic solution 1 drop 3 (three) times daily as needed.  . loratadine (CLARITIN) 10 MG tablet Take 10 mg by mouth daily as needed for allergies.  . Multiple Vitamin (MULTIVITAMIN) tablet Take 1 tablet by mouth daily.  . naproxen sodium (ANAPROX) 220 MG tablet Take 220 mg by mouth as needed.  Marland Kitchen oxymetazoline (AFRIN) 0.05 % nasal spray Place 2 sprays into the nose 2 (two) times daily as needed for congestion.  . simethicone (MYLICON) 867 MG chewable tablet Chew 125 mg by mouth every 6 (six) hours as needed for flatulence.  . sodium chloride (OCEAN) 0.65 % nasal spray Place 1 spray into the nose as needed.  Marland Kitchen SYNTHROID 100 MCG tablet TAKE 1 TABLET DAILY  . vitamin B-12 (CYANOCOBALAMIN) 1000 MCG tablet Take 1 tablet (1,000 mcg total) by mouth  daily.   No facility-administered encounter medications on file as of 07/15/2018.     Activities of Daily Living In your present state of health, do you have any difficulty performing the following activities: 07/15/2018  Hearing? Y  Vision? N  Difficulty concentrating  or making decisions? N  Walking or climbing stairs? N  Dressing or bathing? N  Doing errands, shopping? N  Preparing Food and eating ? N  Using the Toilet? N  In the past six months, have you accidently leaked urine? Y  Do you have problems with loss of bowel control? N  Managing your Medications? N  Managing your Finances? N  Housekeeping or managing your Housekeeping? N  Some recent data might be hidden    Patient Care Team: Tonia Ghent, MD as PCP - General (Family Medicine) Dingeldein, Remo Lipps, MD as Consulting Physician (Ophthalmology) Oneta Rack, MD as Consulting Physician (Dermatology) Kipp Laurence, MD as Referring Physician (Audiology) Sherwood Gambler, DDS as Consulting Physician (Dentistry)    Assessment:   This is a routine wellness examination for [place].  Hearing Screening Comments: Bilateral hearing aids Vision Screening Comments: Vision exam in 2020 with Dr. Sandra Cockayne  Exercise Activities and Dietary recommendations Current Exercise Habits: Home exercise routine, Type of exercise: walking, Time (Minutes): 20, Frequency (Times/Week): 7, Weekly Exercise (Minutes/Week): 140, Intensity: Mild, Exercise limited by: None identified  Goals    . Increase physical activity     Starting 07/15/18 and weather permitting, I will continue to walk at least 20 minutes daily.        Fall Risk Fall Risk  07/15/2018 07/09/2017 06/30/2016 06/28/2015 06/29/2014  Falls in the past year? 1 Yes Yes No No  Comment - 2 falls related to loss of balance; wrist fracture with last fall in Mar 19 pt fell while walking in woods; no medical treatment - -  Number falls in past yr: 1 2 or more 1 - -  Injury with Fall?  0 Yes Yes - -  Risk Factor Category  - High Fall Risk - - -  Risk for fall due to : Impaired balance/gait;History of fall(s) Impaired balance/gait - - -   Depression Screen PHQ 2/9 Scores 07/15/2018 07/09/2017 06/30/2016 06/28/2015  PHQ - 2 Score 0 0 0 0  PHQ- 9 Score 0 0 - -     Cognitive Function MMSE - Mini Mental State Exam 07/15/2018 07/09/2017 06/30/2016 06/28/2015  Orientation to time 5 5 5 5   Orientation to Place 5 5 5 5   Registration 3 3 3 3   Attention/ Calculation 0 0 0 0  Recall 3 3 3 3   Language- name 2 objects 0 0 0 0  Language- repeat 1 1 1 1   Language- follow 3 step command 0 3 3 3   Language- read & follow direction 0 0 0 0  Write a sentence 0 0 0 0  Copy design 0 0 0 0  Total score 17 20 20 20      PLEASE NOTE: A Mini-Cog screen was completed. Maximum score is 17. A value of 0 denotes this part of [place] MMSE was not completed or the patient failed this part of the Mini-Cog screening.   Mini-Cog Screening Orientation to Time - Max 5 pts Orientation to Place - Max 5 pts Registration - Max 3 pts Recall - Max 3 pts Language Repeat - Max 1 pts      Immunization History  Administered Date(s) Administered  . Influenza Whole 11/17/2005, 12/16/2007, 11/17/2009  . Influenza, High Dose Seasonal PF 11/22/2014, 11/18/2016, 12/08/2017  . Influenza-Unspecified 11/17/2013, 11/18/2015, 11/18/2016  . Pneumococcal Conjugate-13 06/29/2014  . Pneumococcal Polysaccharide-23 03/02/2002  . Td 02/18/1996, 03/28/2008  . Zoster 07/03/2015    Screening Tests Health Maintenance  Topic Date Due  .  MAMMOGRAM  07/01/2018  . TETANUS/TDAP  02/17/2020 (Originally 03/28/2018)  . INFLUENZA VACCINE  09/18/2018  . DEXA SCAN  Completed  . PNA vac Low Risk Adult  Completed       Plan:     I have personally reviewed, addressed, and noted the following in the patient's chart:  A. Medical and social history B. Use of alcohol, tobacco or illicit drugs  C. Current medications and  supplements D. Functional ability and status E.  Nutritional status F.  Physical activity G. Advance directives H. List of other physicians I.  Hospitalizations, surgeries, and ER visits in previous 12 months J.  Vitals (unless it is a telemedicine encounter) K. Screenings to include cognitive, depression, hearing, vision (NOTE: hearing and vision screenings not completed in telemedicine encounter) L. Referrals and appointments   In addition, I have reviewed and discussed with patient certain preventive protocols, quality metrics, and best practice recommendations. A written personalized care plan for preventive services and recommendations were provided to patient.  Interactive audio and video telecommunications were attempted with patient. This attempt was unsuccessful due to patient having technical difficulties OR patient did not have access to video capability.  Encounter was completed with audio only.  Two patient identifiers were used to ensure the encounter occurred with the correct person.   Patient was in home and writer was in office.   Signed,   Lindell Noe, MHA, BS, LPN Health Coach

## 2018-07-15 NOTE — Patient Instructions (Signed)
Ms. Freund , Thank you for taking time to come for your Medicare Wellness Visit. I appreciate your ongoing commitment to your health goals. Please review the following plan we discussed and let me know if I can assist you in the future.   These are the goals we discussed: Goals    . Increase physical activity     Starting 07/15/18 and weather permitting, I will continue to walk at least 20 minutes daily.        This is a list of the screening recommended for you and due dates:  Health Maintenance  Topic Date Due  . Mammogram  07/01/2018  . Tetanus Vaccine  02/17/2020*  . Flu Shot  09/18/2018  . DEXA scan (bone density measurement)  Completed  . Pneumonia vaccines  Completed  *Topic was postponed. The date shown is not the original due date.   Preventive Care for Adults  A healthy lifestyle and preventive care can promote health and wellness. Preventive health guidelines for adults include the following key practices.  . A routine yearly physical is a good way to check with your health care provider about your health and preventive screening. It is a chance to share any concerns and updates on your health and to receive a thorough exam.  . Visit your dentist for a routine exam and preventive care every 6 months. Brush your teeth twice a day and floss once a day. Good oral hygiene prevents tooth decay and gum disease.  . The frequency of eye exams is based on your age, health, family medical history, use  of contact lenses, and other factors. Follow your health care provider's recommendations for frequency of eye exams.  . Eat a healthy diet. Foods like vegetables, fruits, whole grains, low-fat dairy products, and lean protein foods contain the nutrients you need without too many calories. Decrease your intake of foods high in solid fats, added sugars, and salt. Eat the right amount of calories for you. Get information about a proper diet from your health care provider, if  necessary.  . Regular physical exercise is one of the most important things you can do for your health. Most adults should get at least 150 minutes of moderate-intensity exercise (any activity that increases your heart rate and causes you to sweat) each week. In addition, most adults need muscle-strengthening exercises on 2 or more days a week.  Silver Sneakers may be a benefit available to you. To determine eligibility, you may visit the website: www.silversneakers.com or contact program at 517-423-0864 Mon-Fri between 8AM-8PM.   . Maintain a healthy weight. The body mass index (BMI) is a screening tool to identify possible weight problems. It provides an estimate of body fat based on height and weight. Your health care provider can find your BMI and can help you achieve or maintain a healthy weight.   For adults 20 years and older: ? A BMI below 18.5 is considered underweight. ? A BMI of 18.5 to 24.9 is normal. ? A BMI of 25 to 29.9 is considered overweight. ? A BMI of 30 and above is considered obese.   . Maintain normal blood lipids and cholesterol levels by exercising and minimizing your intake of saturated fat. Eat a balanced diet with plenty of fruit and vegetables. Blood tests for lipids and cholesterol should begin at age 47 and be repeated every 5 years. If your lipid or cholesterol levels are high, you are over 50, or you are at high risk for heart disease,  you may need your cholesterol levels checked more frequently. Ongoing high lipid and cholesterol levels should be treated with medicines if diet and exercise are not working.  . If you smoke, find out from your health care provider how to quit. If you do not use tobacco, please do not start.  . If you choose to drink alcohol, please do not consume more than 2 drinks per day. One drink is considered to be 12 ounces (355 mL) of beer, 5 ounces (148 mL) of wine, or 1.5 ounces (44 mL) of liquor.  . If you are 70-24 years old, ask your  health care provider if you should take aspirin to prevent strokes.  . Use sunscreen. Apply sunscreen liberally and repeatedly throughout the day. You should seek shade when your shadow is shorter than you. Protect yourself by wearing long sleeves, pants, a wide-brimmed hat, and sunglasses year round, whenever you are outdoors.  . Once a month, do a whole body skin exam, using a mirror to look at the skin on your back. Tell your health care provider of new moles, moles that have irregular borders, moles that are larger than a pencil eraser, or moles that have changed in shape or color.

## 2018-07-15 NOTE — Progress Notes (Signed)
Pandemic considerations d/w pt.    Mammogram 2020 pending.  DXA done 2019.  D/w pt.  She wanted to defer tx at this point, as of 2020.   Colon cancer screening deferred given age, d/w pt.   Tetanus 2010 Flu 2019 PNA UTD zostavax 2017 Advance directive- husband designated if patient were incapacitated.  She has caregiver strain with her son, at baseline.  He is still living in their vacation home and they are still trying to get him evaluated.  D/w pt.  She is safe at home.  D/w pt.   Her BP is controlled at home.  She likely has white coat HTN.   D/w pt.   Elevated Cholesterol: Using medications without problems: yes Muscle aches: no Diet compliance: yes Exercise:yes Labs d/w pt.   Osteopenia.  She wanted to defer tx at this point.  Vit D def d/w pt.  Labs d/w pt.  She was out of vit D recently, d/w pt about restart.    Hypothyroidism.  TSH wnl.  No neck masses.  No dysphagia.    She has episodic brief sx upon waking with rolling over.  Movement sensation.  Resolved after getting up.  Not happening otherwise later in the day.  Going on for months.  She hasn't done home exercises.    No FCNAVD.   PMH and SH reviewed  Meds, vitals, and allergies reviewed.   ROS: Per HPI unless specifically indicated in ROS section   GEN: nad, alert and oriented HEENT: TM wnl NECK: supple w/o LA CV: rrr. PULM: ctab, no inc wob ABD: soft, +bs EXT: no edema SKIN: no acute rash CN 2-12 wnl B, S/S wnl x4 Normal cerebellar testing.  No vertigo with head turning at OV

## 2018-07-15 NOTE — Progress Notes (Signed)
PCP notes:   Health maintenance:  Tetanus vaccine - pt desires to discuss vaccine with PCP Mammogram - future appt scheduled  Abnormal screenings:   Fall risk - hx of multiple fall Fall Risk  07/15/2018 07/09/2017 06/30/2016 06/28/2015 06/29/2014  Falls in the past year? 1 Yes Yes No No  Comment 1 fell after slipping on wet leaves 2 tripped while putting up Christmas tree 2 falls related to loss of balance; wrist fracture with last fall in Mar 19 pt fell while walking in woods; no medical treatment - -  Number falls in past yr: 1 2 or more 1 - -  Injury with Fall? 0 Yes Yes - -  Risk Factor Category  - High Fall Risk - - -  Risk for fall due to : Impaired balance/gait;History of fall(s) Impaired balance/gait - - -   Patient concerns:   Dizziness when waking up - onset several months ago.   Nurse concerns:  None  Next PCP appt:   07/15/18 @ 1415  I reviewed health advisor's note, was available for consultation on the day of service listed in this note, and agree with documentation and plan. Elsie Stain, MD.

## 2018-07-18 DIAGNOSIS — R42 Dizziness and giddiness: Secondary | ICD-10-CM | POA: Insufficient documentation

## 2018-07-18 DIAGNOSIS — Z7189 Other specified counseling: Secondary | ICD-10-CM | POA: Insufficient documentation

## 2018-07-18 NOTE — Assessment & Plan Note (Signed)
TSH wnl.  No neck masses.  No dysphagia.  Continue as is.  She agrees.

## 2018-07-18 NOTE — Assessment & Plan Note (Signed)
Normal testing here today.  She could have BPV.  Discussed with patient about options.  She can try home bedside exercise and update me as needed.  No sign of ominous symptoms.  She agrees.

## 2018-07-18 NOTE — Assessment & Plan Note (Signed)
Advance directive- husband designated if patient were incapacitated.  

## 2018-07-18 NOTE — Assessment & Plan Note (Signed)
Labs are reasonable.  Continue statin.  Continue work on diet and exercise.  She agrees.  See above.

## 2018-07-18 NOTE — Assessment & Plan Note (Signed)
Osteopenia.  She wanted to defer tx at this point.  Vit D def d/w pt.  Labs d/w pt.  She was out of vit D recently, d/w pt about restart.

## 2018-07-18 NOTE — Assessment & Plan Note (Signed)
  Mammogram 2020 pending.  DXA done 2019.  D/w pt.  She wanted to defer tx at this point, as of 2020.   Colon cancer screening deferred given age, d/w pt.   Tetanus 2010 Flu 2019 PNA UTD zostavax 2017 Advance directive- husband designated if patient were incapacitated.  She has caregiver strain with her son, at baseline.  He is still living in their vacation home and they are still trying to get him evaluated.  D/w pt.  She is safe at home.  D/w pt.

## 2018-08-17 ENCOUNTER — Other Ambulatory Visit: Payer: Self-pay

## 2018-08-17 ENCOUNTER — Ambulatory Visit
Admission: RE | Admit: 2018-08-17 | Discharge: 2018-08-17 | Disposition: A | Discharge status: Home / Self Care | Payer: Medicare Other | Source: Ambulatory Visit | Attending: Family Medicine | Admitting: Family Medicine

## 2018-08-17 DIAGNOSIS — Z1231 Encounter for screening mammogram for malignant neoplasm of breast: Secondary | ICD-10-CM | POA: Diagnosis not present

## 2018-10-06 DIAGNOSIS — L400 Psoriasis vulgaris: Secondary | ICD-10-CM | POA: Diagnosis not present

## 2018-10-06 DIAGNOSIS — S80261A Insect bite (nonvenomous), right knee, initial encounter: Secondary | ICD-10-CM | POA: Diagnosis not present

## 2018-10-06 DIAGNOSIS — L448 Other specified papulosquamous disorders: Secondary | ICD-10-CM | POA: Diagnosis not present

## 2018-11-02 DIAGNOSIS — Z23 Encounter for immunization: Secondary | ICD-10-CM | POA: Diagnosis not present

## 2019-03-14 ENCOUNTER — Ambulatory Visit: Payer: Medicare Other | Attending: Internal Medicine

## 2019-03-14 DIAGNOSIS — Z23 Encounter for immunization: Secondary | ICD-10-CM | POA: Insufficient documentation

## 2019-03-14 NOTE — Progress Notes (Signed)
   Covid-19 Vaccination Clinic  Name:  Diane Tucker    MRN: [id number] DOB: [birth year]  03/14/2019  [name] was observed post Covid-19 immunization for 15 minutes without incidence. She was provided with Vaccine Information Sheet and instruction to access the V-Safe system.   [name] was instructed to call 911 with any severe reactions post vaccine: Marland Kitchen Difficulty breathing  . Swelling of your face and throat  . A fast heartbeat  . A bad rash all over your body  . Dizziness and weakness    Immunizations Administered    Name Date Dose VIS Date Route   Pfizer COVID-19 Vaccine 03/14/2019 10:19 AM 0.3 mL 01/28/2019 Intramuscular   Manufacturer: [place]   Lot: [id number]   [place]: [id number]

## 2019-04-04 ENCOUNTER — Ambulatory Visit: Payer: Medicare Other | Attending: Internal Medicine

## 2019-04-04 DIAGNOSIS — Z23 Encounter for immunization: Secondary | ICD-10-CM

## 2019-04-04 NOTE — Progress Notes (Signed)
   Covid-19 Vaccination Clinic  Name:  Diane Tucker    MRN: [id number] DOB: 06/18/1933  04/04/2019  [name] was observed post Covid-19 immunization for 15 minutes without incidence. She was provided with Vaccine Information Sheet and instruction to access the V-Safe system.   [name] was instructed to call 911 with any severe reactions post vaccine: Marland Kitchen Difficulty breathing  . Swelling of your face and throat  . A fast heartbeat  . A bad rash all over your body  . Dizziness and weakness    Immunizations Administered    Name Date Dose VIS Date Route   Pfizer COVID-19 Vaccine 04/04/2019 10:34 AM 0.3 mL 01/28/2019 Intramuscular   Manufacturer: [place]   Lot: [id number]   [place]: [id number]

## 2019-07-05 DIAGNOSIS — H26491 Other secondary cataract, right eye: Secondary | ICD-10-CM | POA: Diagnosis not present

## 2019-07-08 ENCOUNTER — Other Ambulatory Visit: Payer: Self-pay | Admitting: Family Medicine

## 2019-07-08 DIAGNOSIS — Z1231 Encounter for screening mammogram for malignant neoplasm of breast: Secondary | ICD-10-CM

## 2019-07-15 ENCOUNTER — Other Ambulatory Visit: Payer: Self-pay

## 2019-07-15 ENCOUNTER — Other Ambulatory Visit (INDEPENDENT_AMBULATORY_CARE_PROVIDER_SITE_OTHER): Payer: Medicare Other

## 2019-07-15 ENCOUNTER — Other Ambulatory Visit (INDEPENDENT_AMBULATORY_CARE_PROVIDER_SITE_OTHER): Payer: Medicare Other | Admitting: Family Medicine

## 2019-07-15 DIAGNOSIS — M8580 Other specified disorders of bone density and structure, unspecified site: Secondary | ICD-10-CM

## 2019-07-15 DIAGNOSIS — E559 Vitamin D deficiency, unspecified: Secondary | ICD-10-CM | POA: Diagnosis not present

## 2019-07-15 DIAGNOSIS — E039 Hypothyroidism, unspecified: Secondary | ICD-10-CM

## 2019-07-15 DIAGNOSIS — E785 Hyperlipidemia, unspecified: Secondary | ICD-10-CM | POA: Diagnosis not present

## 2019-07-15 LAB — COMPREHENSIVE METABOLIC PANEL
ALT: 16 U/L (ref 0–35)
AST: 22 U/L (ref 0–37)
Albumin: 4.2 g/dL (ref 3.5–5.2)
Alkaline Phosphatase: 75 U/L (ref 39–117)
BUN: 21 mg/dL (ref 6–23)
CO2: 28 mEq/L (ref 19–32)
Calcium: 9.5 mg/dL (ref 8.4–10.5)
Chloride: 103 mEq/L (ref 96–112)
Creatinine, Ser: 0.85 mg/dL (ref 0.40–1.20)
GFR: 63.76 mL/min (ref >60.00)
Glucose, Bld: 89 mg/dL (ref 70–99)
Potassium: 4.3 mEq/L (ref 3.5–5.1)
Sodium: 138 mEq/L (ref 135–145)
Total Bilirubin: 0.7 mg/dL (ref 0.2–1.2)
Total Protein: 6.5 g/dL (ref 6.0–8.3)

## 2019-07-15 LAB — VITAMIN D 25 HYDROXY (VIT D DEFICIENCY, FRACTURES): VITD: 34.05 ng/mL (ref 30.00–100.00)

## 2019-07-15 LAB — LIPID PANEL
Cholesterol: 177 mg/dL (ref 0–200)
HDL: 56.2 mg/dL (ref >39.00)
LDL Cholesterol: 96 mg/dL (ref 0–99)
NonHDL: 121.03
Total CHOL/HDL Ratio: 3
Triglycerides: 125 mg/dL (ref 0.0–149.0)
VLDL: 25 mg/dL (ref 0.0–40.0)

## 2019-07-15 LAB — TSH: TSH: 0.48 u[IU]/mL (ref 0.35–4.50)

## 2019-07-21 ENCOUNTER — Other Ambulatory Visit: Payer: Self-pay

## 2019-07-21 ENCOUNTER — Encounter: Payer: Self-pay | Admitting: Family Medicine

## 2019-07-21 ENCOUNTER — Ambulatory Visit (INDEPENDENT_AMBULATORY_CARE_PROVIDER_SITE_OTHER): Payer: Medicare Other | Admitting: Family Medicine

## 2019-07-21 VITALS — BP 162/72 | HR 85 | Temp 97.1°F | Ht 62.5 in | Wt 149.6 lb

## 2019-07-21 DIAGNOSIS — M8580 Other specified disorders of bone density and structure, unspecified site: Secondary | ICD-10-CM | POA: Diagnosis not present

## 2019-07-21 DIAGNOSIS — Z0000 Encounter for general adult medical examination without abnormal findings: Secondary | ICD-10-CM | POA: Diagnosis not present

## 2019-07-21 DIAGNOSIS — E038 Other specified hypothyroidism: Secondary | ICD-10-CM

## 2019-07-21 DIAGNOSIS — E785 Hyperlipidemia, unspecified: Secondary | ICD-10-CM | POA: Diagnosis not present

## 2019-07-21 DIAGNOSIS — Z7189 Other specified counseling: Secondary | ICD-10-CM

## 2019-07-21 DIAGNOSIS — Z23 Encounter for immunization: Secondary | ICD-10-CM

## 2019-07-21 MED ORDER — VITAMIN B 12 500 MCG PO TABS: 500.0000 ug | ORAL_TABLET | Freq: Every day | ORAL | Status: DC

## 2019-07-21 NOTE — Progress Notes (Signed)
This visit occurred during the SARS-CoV-2 public health emergency.  Safety protocols were in place, including screening questions prior to the visit, additional usage of staff PPE, and extensive cleaning of exam room while observing appropriate contact time as indicated for disinfecting solutions.  I have personally reviewed the Medicare Annual Wellness questionnaire and have noted 1. The patient's medical and social history 2. Their use of alcohol, tobacco or illicit drugs 3. Their current medications and supplements 4. The patient's functional ability including ADL's, fall risks, home safety risks and hearing or visual             impairment. 5. Diet and physical activities 6. Evidence for depression or mood disorders  The patients weight, height, BMI have been recorded in the chart and visual acuity is per eye clinic.  I have made referrals, counseling and provided education to the patient based review of the above and I have provided the pt with a written personalized care plan for preventive services.  Provider list updated- see scanned forms.  Routine anticipatory guidance given to patient.  See health maintenance. The possibility exists that previously documented standard health maintenance information may have been brought forward from a previous encounter into this note.  If needed, that same information has been updated to reflect the current situation based on today's encounter.    Flu 2020 Shingles discussed with patient PNA up-to-date Tetanus 2021 Covid 2021.   Colon cancer screening not applicable due to age. Breast cancer screening pending 2021.   Bone density test deferred since she would not want treatment 2021 Advance directive-husband designated patient were incapacitated. Cognitive function addressed- see scanned forms- and if abnormal then additional documentation follows.   Daily but small BMs, harder stools.  D/w pt about inc fluids and miralax prn.  She had a lot of  fiber in diet.  No blood in stool.    D/w pt about HAV and HBV prior to international travel this fall.  She can start the series today.  Elevated Cholesterol: Using medications without problems: yes Muscle aches: no Diet compliance: encouraged.   Exercise: encouraged Labs d/w pt.   BP improved and controlled on home checks.  SBP 130s at home.    Osteopenia.  She wanted to defer f/u DXA and tx at this point.   Hypothyroidism.  TSH wnl.  Compliant with levothyroxine.  No ADE on med.  No neck mass.    PMH and SH reviewed  Meds, vitals, and allergies reviewed.   ROS: Per HPI.  Unless specifically indicated otherwise in HPI, the patient denies:  General: fever. Eyes: acute vision changes ENT: sore throat Cardiovascular: chest pain Respiratory: SOB GI: vomiting GU: dysuria Musculoskeletal: acute back pain Derm: acute rash Neuro: acute motor dysfunction Psych: worsening mood Endocrine: polydipsia Heme: bleeding Allergy: hayfever  GEN: nad, alert and oriented HEENT: ncat NECK: supple w/o LA CV: rrr. PULM: ctab, no inc wob ABD: soft, +bs EXT: no edema SKIN: no acute rash

## 2019-07-21 NOTE — Patient Instructions (Addendum)
Tetanus shot today.  Hep A and B shot today to start the series.  Please schedule next shot on the way out.   You can get the shingles shot later on.  Take care.  Glad to see you.  Have the pharmacy contact us when you need refills.

## 2019-07-24 ENCOUNTER — Telehealth: Payer: Self-pay | Admitting: Family Medicine

## 2019-07-24 DIAGNOSIS — Z23 Encounter for immunization: Secondary | ICD-10-CM | POA: Insufficient documentation

## 2019-07-24 NOTE — Assessment & Plan Note (Signed)
Advance directive-husband designated patient were incapacitated 

## 2019-07-24 NOTE — Assessment & Plan Note (Signed)
Osteopenia.  She wanted to defer f/u DXA and tx at this point.

## 2019-07-24 NOTE — Assessment & Plan Note (Signed)
Continue Lipitor.  Continue work on diet and exercise.

## 2019-07-24 NOTE — Assessment & Plan Note (Signed)
D/w pt about HAV and HBV prior to international travel this fall.  She can start the series today.

## 2019-07-24 NOTE — Telephone Encounter (Signed)
Please update patient.  Based on current guidelines she would not need antibiotic prophylaxis for routine dental work.  Thanks.

## 2019-07-24 NOTE — Assessment & Plan Note (Signed)
TSH wnl.  Compliant with levothyroxine.  No ADE on med.  No neck mass.  Continue as is.  She agrees.

## 2019-07-24 NOTE — Assessment & Plan Note (Addendum)
Flu 2020 Shingles discussed with patient PNA up-to-date Tetanus 2021 Covid 2021.   Colon cancer screening not applicable due to age. Breast cancer screening pending 2021.   Bone density test deferred since she would not want treatment 2021 Advance directive-husband designated patient were incapacitated. Cognitive function addressed- see scanned forms- and if abnormal then additional documentation follows.

## 2019-07-25 NOTE — Telephone Encounter (Signed)
Patient advised.

## 2019-08-17 ENCOUNTER — Other Ambulatory Visit: Payer: Self-pay | Admitting: Family Medicine

## 2019-08-18 ENCOUNTER — Other Ambulatory Visit: Payer: Self-pay

## 2019-08-18 ENCOUNTER — Ambulatory Visit
Admission: RE | Admit: 2019-08-18 | Discharge: 2019-08-18 | Disposition: A | Discharge status: Home / Self Care | Payer: Medicare Other | Source: Ambulatory Visit | Attending: Family Medicine | Admitting: Family Medicine

## 2019-08-18 DIAGNOSIS — Z1231 Encounter for screening mammogram for malignant neoplasm of breast: Secondary | ICD-10-CM

## 2019-08-25 ENCOUNTER — Ambulatory Visit (INDEPENDENT_AMBULATORY_CARE_PROVIDER_SITE_OTHER): Payer: Medicare Other

## 2019-08-25 ENCOUNTER — Other Ambulatory Visit: Payer: Self-pay

## 2019-08-25 DIAGNOSIS — Z23 Encounter for immunization: Secondary | ICD-10-CM

## 2019-10-06 DIAGNOSIS — D225 Melanocytic nevi of trunk: Secondary | ICD-10-CM | POA: Diagnosis not present

## 2019-10-06 DIAGNOSIS — L538 Other specified erythematous conditions: Secondary | ICD-10-CM | POA: Diagnosis not present

## 2019-10-06 DIAGNOSIS — D2262 Melanocytic nevi of left upper limb, including shoulder: Secondary | ICD-10-CM | POA: Diagnosis not present

## 2019-10-06 DIAGNOSIS — L820 Inflamed seborrheic keratosis: Secondary | ICD-10-CM | POA: Diagnosis not present

## 2019-10-06 DIAGNOSIS — D2271 Melanocytic nevi of right lower limb, including hip: Secondary | ICD-10-CM | POA: Diagnosis not present

## 2019-10-06 DIAGNOSIS — L448 Other specified papulosquamous disorders: Secondary | ICD-10-CM | POA: Diagnosis not present

## 2019-10-06 DIAGNOSIS — D2272 Melanocytic nevi of left lower limb, including hip: Secondary | ICD-10-CM | POA: Diagnosis not present

## 2019-10-06 DIAGNOSIS — D2261 Melanocytic nevi of right upper limb, including shoulder: Secondary | ICD-10-CM | POA: Diagnosis not present

## 2019-10-06 DIAGNOSIS — L821 Other seborrheic keratosis: Secondary | ICD-10-CM | POA: Diagnosis not present

## 2019-11-21 DIAGNOSIS — Z23 Encounter for immunization: Secondary | ICD-10-CM | POA: Diagnosis not present

## 2019-11-22 NOTE — Telephone Encounter (Signed)
Pt said Dr Arita Miss in [place] does request in writing an letter that pt is not required to premedicate with abx prior to teeth cleaning. Pt request letter emailed to Dr Ronnald Ramp at [email or web address].

## 2019-11-23 ENCOUNTER — Encounter: Payer: Self-pay | Admitting: Family Medicine

## 2019-11-23 NOTE — Telephone Encounter (Signed)
Please make sure letter printed.  I can sign it tomorrow.  Thanks.

## 2019-11-24 NOTE — Telephone Encounter (Signed)
Letter given to Diane Tucker to email as requested.

## 2020-01-11 DIAGNOSIS — Z20822 Contact with and (suspected) exposure to covid-19: Secondary | ICD-10-CM | POA: Diagnosis not present

## 2020-01-11 DIAGNOSIS — Z03818 Encounter for observation for suspected exposure to other biological agents ruled out: Secondary | ICD-10-CM | POA: Diagnosis not present

## 2020-07-18 ENCOUNTER — Other Ambulatory Visit: Payer: Self-pay

## 2020-07-18 ENCOUNTER — Other Ambulatory Visit: Payer: Self-pay | Admitting: Family Medicine

## 2020-07-18 ENCOUNTER — Ambulatory Visit: Payer: Medicare Other

## 2020-07-18 ENCOUNTER — Other Ambulatory Visit (INDEPENDENT_AMBULATORY_CARE_PROVIDER_SITE_OTHER): Payer: Medicare Other

## 2020-07-18 DIAGNOSIS — E039 Hypothyroidism, unspecified: Secondary | ICD-10-CM | POA: Diagnosis not present

## 2020-07-18 DIAGNOSIS — E559 Vitamin D deficiency, unspecified: Secondary | ICD-10-CM | POA: Diagnosis not present

## 2020-07-18 DIAGNOSIS — E785 Hyperlipidemia, unspecified: Secondary | ICD-10-CM

## 2020-07-18 DIAGNOSIS — Z1231 Encounter for screening mammogram for malignant neoplasm of breast: Secondary | ICD-10-CM

## 2020-07-18 LAB — TSH: TSH: 0.48 u[IU]/mL (ref 0.35–4.50)

## 2020-07-18 LAB — VITAMIN D 25 HYDROXY (VIT D DEFICIENCY, FRACTURES): VITD: 31.92 ng/mL (ref 30.00–100.00)

## 2020-07-19 LAB — COMPREHENSIVE METABOLIC PANEL
ALT: 15 U/L (ref 0–35)
AST: 20 U/L (ref 0–37)
Albumin: 4.1 g/dL (ref 3.5–5.2)
Alkaline Phosphatase: 76 U/L (ref 39–117)
BUN: 20 mg/dL (ref 6–23)
CO2: 23 mEq/L (ref 19–32)
Calcium: 9.6 mg/dL (ref 8.4–10.5)
Chloride: 103 mEq/L (ref 96–112)
Creatinine, Ser: 0.86 mg/dL (ref 0.40–1.20)
GFR: 61.89 mL/min (ref >60.00)
Glucose, Bld: 95 mg/dL (ref 70–99)
Potassium: 4.4 mEq/L (ref 3.5–5.1)
Sodium: 140 mEq/L (ref 135–145)
Total Bilirubin: 0.6 mg/dL (ref 0.2–1.2)
Total Protein: 6.6 g/dL (ref 6.0–8.3)

## 2020-07-19 LAB — LIPID PANEL
Cholesterol: 177 mg/dL (ref 0–200)
HDL: 54.6 mg/dL (ref >39.00)
LDL Cholesterol: 97 mg/dL (ref 0–99)
NonHDL: 122.63
Total CHOL/HDL Ratio: 3
Triglycerides: 130 mg/dL (ref 0.0–149.0)
VLDL: 26 mg/dL (ref 0.0–40.0)

## 2020-07-23 ENCOUNTER — Ambulatory Visit (INDEPENDENT_AMBULATORY_CARE_PROVIDER_SITE_OTHER): Payer: Medicare Other | Admitting: Family Medicine

## 2020-07-23 ENCOUNTER — Other Ambulatory Visit: Payer: Self-pay

## 2020-07-23 ENCOUNTER — Encounter: Payer: Self-pay | Admitting: Family Medicine

## 2020-07-23 VITALS — BP 152/78 | HR 73 | Temp 97.8°F | Ht 62.5 in | Wt 148.0 lb

## 2020-07-23 DIAGNOSIS — J309 Allergic rhinitis, unspecified: Secondary | ICD-10-CM | POA: Diagnosis not present

## 2020-07-23 DIAGNOSIS — G609 Hereditary and idiopathic neuropathy, unspecified: Secondary | ICD-10-CM

## 2020-07-23 DIAGNOSIS — R202 Paresthesia of skin: Secondary | ICD-10-CM

## 2020-07-23 DIAGNOSIS — Z7189 Other specified counseling: Secondary | ICD-10-CM

## 2020-07-23 DIAGNOSIS — E785 Hyperlipidemia, unspecified: Secondary | ICD-10-CM | POA: Diagnosis not present

## 2020-07-23 DIAGNOSIS — R030 Elevated blood-pressure reading, without diagnosis of hypertension: Secondary | ICD-10-CM

## 2020-07-23 DIAGNOSIS — Z0000 Encounter for general adult medical examination without abnormal findings: Secondary | ICD-10-CM

## 2020-07-23 DIAGNOSIS — E038 Other specified hypothyroidism: Secondary | ICD-10-CM

## 2020-07-23 LAB — VITAMIN B12: Vitamin B-12: >1550 pg/mL — ABNORMAL HIGH (ref 211–911)

## 2020-07-23 MED ORDER — ATORVASTATIN CALCIUM 10 MG PO TABS: 1.0000 | ORAL_TABLET | Freq: Every day | ORAL | 3 refills | Status: DC

## 2020-07-23 MED ORDER — SYNTHROID 100 MCG PO TABS: 100.0000 ug | ORAL_TABLET | Freq: Every day | ORAL | 3 refills | Status: DC

## 2020-07-23 MED ORDER — FLUTICASONE PROPIONATE 50 MCG/ACT NA SUSP: 2.0000 | Freq: Every day | NASAL | 6 refills | Status: DC

## 2020-07-23 NOTE — Patient Instructions (Addendum)
Go to the lab on the way out.   If you have mychart we'll likely use that to update you.    Try flonase and let me know if that doesn't help.  Take care.  Glad to see you.

## 2020-07-23 NOTE — Progress Notes (Signed)
This visit occurred during the SARS-CoV-2 public health emergency.  Safety protocols were in place, including screening questions prior to the visit, additional usage of staff PPE, and extensive cleaning of exam room while observing appropriate contact time as indicated for disinfecting solutions.  Hypothyroidism.  TSH wnl.  Compliant with meds.  No dysphagia.  No neck mass.    Elevated BP today. Variable BP at home, down to 110s/60s.  Goal to avoid hypotension.  She can update me as needed.  Elevated Cholesterol: Using medications without problems: yes Muscle aches: no Diet compliance: yes Exercise:yes Labs d/w pt.    Her husband had covid about 1 month ago.  D/w pt.    Mammogram 2021 DXA done 2019.  D/w pt.  She wanted to defer tx at this point since she may have some dental work done.  Had recent root canal 2022.  Colon cancer screening deferred given age, d/w pt. I asked her to check with her dentist and then update me.   Tetanus 2021 Flu 2021 PNA UTD zostavax 2017 Advance directive- husband designated if patient were incapacitated.  She has caregiver strain with her son, at baseline. He is still living in their vacation home.  D/w pt. She is safe at home.  She has persistent nasal congestion. D/w pt about limiting afrin and trying flonase.  No fevers.  No facial pain but feels stuffy.    She still has likely neuropathy in feet at baseline.  Some occ paresthesia. Can have a prickly feeling in the B feet, going on for years intermittently. We talked about balance exercises and routine fall precautions at baseline.  Meds, vitals, and allergies reviewed.   ROS: Per HPI unless specifically indicated in ROS section   GEN: nad, alert and oriented HEENT: NCAT, nasal passages slightly stuffy. NECK: supple w/o LA CV: rrr. PULM: ctab, no inc wob ABD: soft, +bs EXT: no edema SKIN: no acute rash S/S wnl x4, normal monofilament testing x4.

## 2020-07-24 ENCOUNTER — Encounter: Payer: Self-pay | Admitting: Family Medicine

## 2020-07-25 ENCOUNTER — Other Ambulatory Visit: Payer: Self-pay | Admitting: Family Medicine

## 2020-07-25 NOTE — Assessment & Plan Note (Signed)
Presumed allergic cause, with persistent symptoms.  Discussed using Flonase instead of Afrin.

## 2020-07-25 NOTE — Assessment & Plan Note (Signed)
Mammogram 2021 DXA done 2019.  D/w pt.  She wanted to defer tx at this point since she may have some dental work done.  Had recent root canal 2022.  Colon cancer screening deferred given age, d/w pt. I asked her to check with her dentist and then update me.   Tetanus 2021 Flu 2021 PNA UTD zostavax 2017 Advance directive- husband designated if patient were incapacitated.  She has caregiver strain with her son, at baseline. He is still living in their vacation home.  D/w pt. She is safe at home.

## 2020-07-25 NOTE — Assessment & Plan Note (Signed)
Hypothyroidism.  TSH wnl.  Compliant with meds.  No dysphagia.  No neck mass.   Continue levothyroxine.

## 2020-07-25 NOTE — Assessment & Plan Note (Signed)
Continue atorvastatin.  Labs discussed with patient. 

## 2020-07-25 NOTE — Assessment & Plan Note (Signed)
We will check B12 level.  See notes on labs.  Routine cautions given to patient.

## 2020-07-25 NOTE — Assessment & Plan Note (Signed)
Advance directive- husband designated if patient were incapacitated.  

## 2020-07-25 NOTE — Assessment & Plan Note (Signed)
She can update me if she has persistent elevation.  Has been significantly lower on home checks.

## 2020-07-29 ENCOUNTER — Other Ambulatory Visit: Payer: Self-pay | Admitting: Family Medicine

## 2020-07-29 DIAGNOSIS — Z780 Asymptomatic menopausal state: Secondary | ICD-10-CM

## 2020-07-29 DIAGNOSIS — M8580 Other specified disorders of bone density and structure, unspecified site: Secondary | ICD-10-CM

## 2020-08-07 ENCOUNTER — Ambulatory Visit: Payer: Medicare Other

## 2020-08-09 ENCOUNTER — Ambulatory Visit (INDEPENDENT_AMBULATORY_CARE_PROVIDER_SITE_OTHER): Payer: Medicare Other

## 2020-08-09 ENCOUNTER — Other Ambulatory Visit: Payer: Self-pay

## 2020-08-09 DIAGNOSIS — Z0000 Encounter for general adult medical examination without abnormal findings: Secondary | ICD-10-CM | POA: Diagnosis not present

## 2020-08-09 NOTE — Progress Notes (Signed)
PCP notes:  Health Maintenance: Shingrix- due Dexa- due    Abnormal Screenings: none   Patient concerns: none   Nurse concerns: none   Next PCP appt.: none

## 2020-08-09 NOTE — Progress Notes (Signed)
Subjective:   Diane Tucker is a 87 y.o. female who presents for Medicare Annual (Subsequent) preventive examination.  Review of Systems: N/A      I connected with the patient today by telephone and verified that I am speaking with the correct person using two identifiers. Location patient: home Location nurse: work Persons participating in the telephone visit: patient, nurse.   I discussed the limitations, risks, security and privacy concerns of performing an evaluation and management service by telephone and the availability of in person appointments. I also discussed with the patient that there may be a patient responsible charge related to this service. The patient expressed understanding and verbally consented to this telephonic visit.        Cardiac Risk Factors include: advanced age (>16men, >17 women);Other (see comment), Risk factor comments: hyperlipidemia     Objective:    Today's Vitals   There is no height or weight on file to calculate BMI.  Advanced Directives 08/09/2020 07/15/2018 07/09/2017 06/30/2016 06/28/2015  Does Patient Have a Medical Advance Directive? Yes Yes Yes Yes Yes  Type of Paramedic of [place];Living will [place];Living will [place];Living will [place];Living will [place];Living will  Does patient want to make changes to medical advance directive? - - - - No - Patient declined  Copy of [place] in Chart? Yes - validated most recent copy scanned in chart (See row information) Yes - validated most recent copy scanned in chart (See row information) Yes No - copy requested No - copy requested    Current Medications (verified) Outpatient Encounter Medications as of 08/09/2020  Medication Sig   atorvastatin (LIPITOR) 10 MG tablet Take 1 tablet (10 mg total) by mouth daily.   Cholecalciferol (VITAMIN D3) 1000 units CAPS Take 2  capsules by mouth daily.    Coenzyme Q10 (COQ10) 200 MG CAPS Take 1 capsule by mouth daily.   fluticasone (FLONASE) 50 MCG/ACT nasal spray Place 2 sprays into both nostrils daily.   hydroxypropyl methylcellulose (ISOPTO TEARS) 2.5 % ophthalmic solution 1 drop 3 (three) times daily as needed.   loratadine (CLARITIN) 10 MG tablet Take 10 mg by mouth daily as needed for allergies.   Multiple Vitamin (MULTIVITAMIN) tablet Take 1 tablet by mouth daily.   naproxen sodium (ANAPROX) 220 MG tablet Take 220 mg by mouth as needed.   sodium chloride (OCEAN) 0.65 % nasal spray Place 1 spray into the nose as needed.   SYNTHROID 100 MCG tablet Take 1 tablet (100 mcg total) by mouth daily.   triamcinolone cream (KENALOG) 0.1 % Apply 1 application topically as needed.   oxymetazoline (AFRIN) 0.05 % nasal spray Place 2 sprays into the nose 2 (two) times daily as needed for congestion.   No facility-administered encounter medications on file as of 08/09/2020.    Allergies (verified) Clindamycin   History: Past Medical History:  Diagnosis Date   Allergy    Arthritis    Dysrhythmia    Edema    ANKLES   GERD (gastroesophageal reflux disease)    HOH (hard of hearing)    AIDS   Hyperlipidemia    Hypothyroidism    Murmur    Neuropathy    FEET   Osteopenia    never on treatment as of 2013   Thyroid disease    Hypothyroidism   Past Surgical History:  Procedure Laterality Date   CATARACT EXTRACTION W/PHACO Right 08/27/2015  Procedure: CATARACT EXTRACTION PHACO AND INTRAOCULAR LENS PLACEMENT (IOC);  Surgeon: Estill Cotta, MD;  Location: [facility] ORS;  Service: Ophthalmology;  Laterality: Right;  [place]  01:22 AP%24.0 CDE 34.56 fluid pack lot # [id number]   DOPPLER ECHOCARDIOGRAPHY  04/1994   MVP with MR (TR)   EYE SURGERY     Holter Monitor     NSR, freq. PVC's, rare colp, freq. PAC's   NSVD     X 2   Rheumatic Fever     (?) as a child. Hosp, prolonged fever for several days.   SKIN CANCER  DESTRUCTION  06/2017   Dr. Kellie Moor   TONSILLECTOMY     As a child   TOOTH EXTRACTION  02/2018   Family History  Problem Relation Age of Onset   Hypertension Mother    Heart disease Mother        CHF, MI   Stroke Father    Heart disease Father        CHF   Hypertension Father    Heart disease Paternal Grandmother        MI   Obesity Paternal Grandmother    Cystic fibrosis Other        3 died as infants   Cancer Paternal Aunt 62       Breast   Breast cancer Paternal Aunt    Stroke Maternal Grandmother 47   Cancer Maternal Grandfather        Lung (smoker)   Hypertension Other    Colon cancer Neg Hx    Social History   Socioeconomic History   Marital status: Married    Spouse name: Not on file   Number of children: 2   Years of education: Not on file   Highest education level: Not on file  Occupational History   Occupation: Public affairs consultant in past    Employer: RETIRED   Occupation: Income Tax, part-time  Tobacco Use   Smoking status: Never   Smokeless tobacco: Never  Vaping Use   Vaping Use: Never used  Substance and Sexual Activity   Alcohol use: Yes    Alcohol/week: 5.0 standard drinks    Types: 5 Glasses of wine per week    Comment: 4-5 oz 5-6 days a week   Drug use: No   Sexual activity: Never  Other Topics Concern   Not on file  Social History Narrative   Married 1964, lives with husband, 2 adult children (one may have schizophrenia)   Former Engineer, building services, retired from tax work   Enjoys Recruitment consultant   Social Determinants of Radio broadcast assistant Strain: Low Risk    Difficulty of Paying Living Expenses: Not hard at [employer] Insecurity: No Food Insecurity   Worried About Charity fundraiser in the Last Year: Never true   Arboriculturist in the Last Year: Never true  Transportation Needs: No Transportation Needs   Lack of Transportation (Medical): No   Lack of Transportation (Non-Medical): No  Physical Activity:  Insufficiently Active   Days of Exercise per Week: 7 days   Minutes of Exercise per Session: 20 min  Stress: No Stress Concern Present   Feeling of Stress : Not at all  Social Connections: Not on file    Tobacco Counseling Counseling given: Not Answered   Clinical Intake:  Pre-visit preparation completed: Yes  Pain : No/denies pain     Nutritional Risks: None Diabetes: No  How often do you need  to have someone help you when you read instructions, pamphlets, or other written materials from your doctor or pharmacy?: 1 - Never  Diabetic: No Nutrition Risk Assessment:  Has the patient had any N/V/D within the last 2 months?  No  Does the patient have any non-healing wounds?  No  Has the patient had any unintentional weight loss or weight gain?  No   Diabetes:  Is the patient diabetic?  No  If diabetic, was a CBG obtained today?   N/A Did the patient bring in their glucometer from home?   N/A How often do you monitor your CBG's? N/A.   Financial Strains and Diabetes Management:  Are you having any financial strains with the device, your supplies or your medication?  N/A .  Does the patient want to be seen by Chronic Care Management for management of their diabetes?   N/A Would the patient like to be referred to a Nutritionist or for Diabetic Management?   N/A Interpreter Needed?: No  Information entered by :: CJohnson, LPN   Activities of Daily Living In your present state of health, do you have any difficulty performing the following activities: 08/09/2020  Hearing? Y  Comment wears hearing aids  Vision? N  Difficulty concentrating or making decisions? N  Walking or climbing stairs? N  Dressing or bathing? N  Doing errands, shopping? N  Preparing Food and eating ? N  Using the Toilet? N  In the past six months, have you accidently leaked urine? N  Do you have problems with loss of bowel control? Y  Comment wears pads when going out  Managing your Medications?  N  Managing your Finances? N  Housekeeping or managing your Housekeeping? N  Some recent data might be hidden    Patient Care Team: Tonia Ghent, MD as PCP - General (Family Medicine) Dingeldein, Remo Lipps, MD as Consulting Physician (Ophthalmology) Oneta Rack, MD as Consulting Physician (Dermatology) Kipp Laurence, MD as Referring Physician (Audiology) Sherwood Gambler, DDS as Consulting Physician (Dentistry)  Indicate any recent Medical Services you may have received from other than [facility] providers in the past year (date may be approximate).     Assessment:   This is a routine wellness examination for [place].  Hearing/Vision screen Vision Screening - Comments:: Patient gets annual eye exams   Dietary issues and exercise activities discussed: Current Exercise Habits: Home exercise routine, Type of exercise: walking, Time (Minutes): 20, Frequency (Times/Week): 7, Weekly Exercise (Minutes/Week): 140, Intensity: Moderate, Exercise limited by: None identified   Goals Addressed             This Visit's Progress    Patient Stated       08/09/2020, I will continue to walk daily for about 20 minutes         Depression Screen PHQ 2/9 Scores 08/09/2020 07/15/2018 07/09/2017 06/30/2016 06/28/2015 06/29/2014 06/24/2013  PHQ - 2 Score 0 0 0 0 0 0 0  PHQ- 9 Score 0 0 0 - - - -    Fall Risk Fall Risk  08/09/2020 07/15/2018 07/09/2017 06/30/2016 06/28/2015  Falls in the past year? 1 1 Yes Yes No  Comment - 1 fell after slipping on wet leaves 2 tripped while putting up Christmas tree 2 falls related to loss of balance; wrist fracture with last fall in Mar 19 pt fell while walking in woods; no medical treatment -  Number falls in past yr: 1 1 2  or more 1 -  Injury with Fall?  1 0 Yes Yes -  Risk Factor Category  - - High Fall Risk - -  Risk for fall due to : History of fall(s);Impaired balance/gait Impaired balance/gait;History of fall(s) Impaired balance/gait - -  Follow up Falls  evaluation completed;Falls prevention discussed - - - -    FALL RISK PREVENTION PERTAINING TO THE HOME:  Any stairs in or around the home? Yes  If so, are there any without handrails? No  Home free of loose throw rugs in walkways, pet beds, electrical cords, etc? Yes  Adequate lighting in your home to reduce risk of falls? Yes   ASSISTIVE DEVICES UTILIZED TO PREVENT FALLS:  Life alert? No  Use of a cane, walker or w/c? No  Grab bars in the bathroom? No  Shower chair or bench in shower? No  Elevated toilet seat or a handicapped toilet? No   TIMED UP AND GO:  Was the test performed?  N/A telephone visit .    Cognitive Function: MMSE - Mini Mental State Exam 08/09/2020 07/15/2018 07/09/2017 06/30/2016 06/28/2015  Orientation to time 5 5 5 5 5   Orientation to Place 5 5 5 5 5   Registration 3 3 3 3 3   Attention/ Calculation 5 0 0 0 0  Recall 3 3 3 3 3   Language- name 2 objects - 0 0 0 0  Language- repeat 1 1 1 1 1   Language- follow 3 step command - 0 3 3 3   Language- read & follow direction - 0 0 0 0  Write a sentence - 0 0 0 0  Copy design - 0 0 0 0  Total score - 17 20 20 20   Mini Cog  Mini-Cog screen was completed. Maximum score is 22. A value of 0 denotes this part of the MMSE was not completed or the patient failed this part of the Mini-Cog screening.       Immunizations Immunization History  Administered Date(s) Administered   Hep A / Hep B 07/21/2019, 08/25/2019   Influenza Whole 11/17/2005, 12/16/2007, 11/17/2009   Influenza, High Dose Seasonal PF 11/22/2014, 11/18/2016, 12/08/2017   Influenza-Unspecified 11/17/2013, 11/18/2015, 11/18/2016, 11/02/2018, 11/21/2019   PFIZER(Purple Top)SARS-COV-2 Vaccination 03/14/2019, 04/04/2019, 11/21/2019, 05/30/2020   Pneumococcal Conjugate-13 06/29/2014   Pneumococcal Polysaccharide-23 03/02/2002   Td 02/18/1996, 03/28/2008, 07/21/2019   Zoster, Live 07/03/2015    TDAP status: Up to date  Flu Vaccine status: Up to  date  Pneumococcal vaccine status: Up to date  Covid-19 vaccine status: Completed vaccines  Qualifies for Shingles Vaccine? Yes   Zostavax completed Yes   Shingrix Completed?: No.    Education has been provided regarding the importance of this vaccine. Patient has been advised to call insurance company to determine out of pocket expense if they have not yet received this vaccine. Advised may also receive vaccine at local pharmacy or Health Dept. Verbalized acceptance and understanding.  Screening Tests Health Maintenance  Topic Date Due   Zoster Vaccines- Shingrix (1 of 2) Never done   MAMMOGRAM  08/17/2020   INFLUENZA VACCINE  09/17/2020   TETANUS/TDAP  07/20/2029   DEXA SCAN  Completed   COVID-19 Vaccine  Completed   PNA vac Low Risk Adult  Completed   HPV VACCINES  Aged Out    Health Maintenance  Health Maintenance Due  Topic Date Due   Zoster Vaccines- Shingrix (1 of 2) Never done    Colorectal cancer screening: No longer required.   Mammogram status: Completed 08/18/2019. Repeat every year  Bone Density  status: Ordered 07/29/2020. Pt provided with contact info and advised to call to schedule appt.  Lung Cancer Screening: (Low Dose CT Chest recommended if Age 69-80 years, 30 pack-year currently smoking OR have quit w/in 15years.) does not qualify.    Additional Screening:  Hepatitis C Screening: does not qualify; Completed N/A  Vision Screening: Recommended annual ophthalmology exams for early detection of glaucoma and other disorders of the eye. Is the patient up to date with their annual eye exam?  Yes  Who is the provider or what is the name of the office in which the patient attends annual eye exams? Dr. Holley Bouche, [facility] If pt is not established with a provider, would they like to be referred to a provider to establish care? No .   Dental Screening: Recommended annual dental exams for proper oral hygiene  Community Resource Referral / Chronic Care  Management: CRR required this visit?  No   CCM required this visit?  No      Plan:     I have personally reviewed and noted the following in the patient's chart:   Medical and social history Use of alcohol, tobacco or illicit drugs  Current medications and supplements including opioid prescriptions.  Functional ability and status Nutritional status Physical activity Advanced directives List of other physicians Hospitalizations, surgeries, and ER visits in previous 12 months Vitals Screenings to include cognitive, depression, and falls Referrals and appointments  In addition, I have reviewed and discussed with patient certain preventive protocols, quality metrics, and best practice recommendations. A written personalized care plan for preventive services as well as general preventive health recommendations were provided to patient.   Due to this being a telephonic visit, the after visit summary with patients personalized plan was offered to patient via office or my-chart. Patient preferred to pick up at office at next visit or via mychart.   Andrez Grime, LPN   [id number]

## 2020-08-09 NOTE — Patient Instructions (Signed)
[name] , Thank you for taking time to come for your Medicare Wellness Visit. I appreciate your ongoing commitment to your health goals. Please review the following plan we discussed and let me know if I can assist you in the future.   Screening recommendations/referrals: Colonoscopy: no longer required  Mammogram: Up to date, completed 08/24/2019, scheduled 09/13/2020 Bone Density: ordered 07/29/2020, Please call and schedule appointment Recommended yearly ophthalmology/optometry visit for glaucoma screening and checkup Recommended yearly dental visit for hygiene and checkup  Vaccinations: Influenza vaccine: Up to date, completed 11/21/2019, due 09/2020 Pneumococcal vaccine: Completed series Tdap vaccine: Up to date, completed 07/21/2019, due 07/2029 Shingles vaccine: due, check with your insurance regarding coverage if interested    Covid-19:Completed series  Advanced directives: copy in chart  Conditions/risks identified: hyperlipidemia   Next appointment: Follow up in one year for your annual wellness visit    Preventive Care 85 Years and Older, Female Preventive care refers to lifestyle choices and visits with your health care provider that can promote health and wellness. What does preventive care include? A yearly physical exam. This is also called an annual well check. Dental exams once or twice a year. Routine eye exams. Ask your health care provider how often you should have your eyes checked. Personal lifestyle choices, including: Daily care of your teeth and gums. Regular physical activity. Eating a healthy diet. Avoiding tobacco and drug use. Limiting alcohol use. Practicing safe sex. Taking low-dose aspirin every day. Taking vitamin and mineral supplements as recommended by your health care provider. What happens during an annual well check? The services and screenings done by your health care provider during your annual well check will depend on your age, overall  health, lifestyle risk factors, and family history of disease. Counseling  Your health care provider may ask you questions about your: Alcohol use. Tobacco use. Drug use. Emotional well-being. Home and relationship well-being. Sexual activity. Eating habits. History of falls. Memory and ability to understand (cognition). Work and work Statistician. Reproductive health. Screening  You may have the following tests or measurements: Height, weight, and BMI. Blood pressure. Lipid and cholesterol levels. These may be checked every 5 years, or more frequently if you are over 85 years old. Skin check. Lung cancer screening. You may have this screening every year starting at age 85 if you have a 30-pack-year history of smoking and currently smoke or have quit within the past 15 years. Fecal occult blood test (FOBT) of the stool. You may have this test every year starting at age 85. Flexible sigmoidoscopy or colonoscopy. You may have a sigmoidoscopy every 5 years or a colonoscopy every 10 years starting at age 85. Hepatitis C blood test. Hepatitis B blood test. Sexually transmitted disease (STD) testing. Diabetes screening. This is done by checking your blood sugar (glucose) after you have not eaten for a while (fasting). You may have this done every 1-3 years. Bone density scan. This is done to screen for osteoporosis. You may have this done starting at age 85. Mammogram. This may be done every 1-2 years. Talk to your health care provider about how often you should have regular mammograms. Talk with your health care provider about your test results, treatment options, and if necessary, the need for more tests. Vaccines  Your health care provider may recommend certain vaccines, such as: Influenza vaccine. This is recommended every year. Tetanus, diphtheria, and acellular pertussis (Tdap, Td) vaccine. You may need a Td booster every 10 years. Zoster vaccine. You  may need this after age  85. Pneumococcal 13-valent conjugate (PCV13) vaccine. One dose is recommended after age 85. Pneumococcal polysaccharide (PPSV23) vaccine. One dose is recommended after age 85. Talk to your health care provider about which screenings and vaccines you need and how often you need them. This information is not intended to replace advice given to you by your health care provider. Make sure you discuss any questions you have with your health care provider. Document Released: 03/02/2015 Document Revised: 10/24/2015 Document Reviewed: 12/05/2014 Elsevier Interactive Patient Education  2017 [place] Prevention in the Home Falls can cause injuries. They can happen to people of all ages. There are many things you can do to make your home safe and to help prevent falls. What can I do on the outside of my home? Regularly fix the edges of walkways and driveways and fix any cracks. Remove anything that might make you trip as you walk through a door, such as a raised step or threshold. Trim any bushes or trees on the path to your home. Use bright outdoor lighting. Clear any walking paths of anything that might make someone trip, such as rocks or tools. Regularly check to see if handrails are loose or broken. Make sure that both sides of any steps have handrails. Any raised decks and porches should have guardrails on the edges. Have any leaves, snow, or ice cleared regularly. Use sand or salt on walking paths during winter. Clean up any spills in your garage right away. This includes oil or grease spills. What can I do in the bathroom? Use night lights. Install grab bars by the toilet and in the tub and shower. Do not use towel bars as grab bars. Use non-skid mats or decals in the tub or shower. If you need to sit down in the shower, use a plastic, non-slip stool. Keep the floor dry. Clean up any water that spills on the floor as soon as it happens. Remove soap buildup in the tub or shower  regularly. Attach bath mats securely with double-sided non-slip rug tape. Do not have throw rugs and other things on the floor that can make you trip. What can I do in the bedroom? Use night lights. Make sure that you have a light by your bed that is easy to reach. Do not use any sheets or blankets that are too big for your bed. They should not hang down onto the floor. Have a firm chair that has side arms. You can use this for support while you get dressed. Do not have throw rugs and other things on the floor that can make you trip. What can I do in the kitchen? Clean up any spills right away. Avoid walking on wet floors. Keep items that you use a lot in easy-to-reach places. If you need to reach something above you, use a strong step stool that has a grab bar. Keep electrical cords out of the way. Do not use floor polish or wax that makes floors slippery. If you must use wax, use non-skid floor wax. Do not have throw rugs and other things on the floor that can make you trip. What can I do with my stairs? Do not leave any items on the stairs. Make sure that there are handrails on both sides of the stairs and use them. Fix handrails that are broken or loose. Make sure that handrails are as long as the stairways. Check any carpeting to make sure that it is firmly  attached to the stairs. Fix any carpet that is loose or worn. Avoid having throw rugs at the top or bottom of the stairs. If you do have throw rugs, attach them to the floor with carpet tape. Make sure that you have a light switch at the top of the stairs and the bottom of the stairs. If you do not have them, ask someone to add them for you. What else can I do to help prevent falls? Wear shoes that: Do not have high heels. Have rubber bottoms. Are comfortable and fit you well. Are closed at the toe. Do not wear sandals. If you use a stepladder: Make sure that it is fully opened. Do not climb a closed stepladder. Make sure that  both sides of the stepladder are locked into place. Ask someone to hold it for you, if possible. Clearly mark and make sure that you can see: Any grab bars or handrails. First and last steps. Where the edge of each step is. Use tools that help you move around (mobility aids) if they are needed. These include: Canes. Walkers. Scooters. Crutches. Turn on the lights when you go into a dark area. Replace any light bulbs as soon as they burn out. Set up your furniture so you have a clear path. Avoid moving your furniture around. If any of your floors are uneven, fix them. If there are any pets around you, be aware of where they are. Review your medicines with your doctor. Some medicines can make you feel dizzy. This can increase your chance of falling. Ask your doctor what other things that you can do to help prevent falls. This information is not intended to replace advice given to you by your health care provider. Make sure you discuss any questions you have with your health care provider. Document Released: 11/30/2008 Document Revised: 07/12/2015 Document Reviewed: 03/10/2014 Elsevier Interactive Patient Education  2017 [employer].

## 2020-09-13 ENCOUNTER — Ambulatory Visit
Admission: RE | Admit: 2020-09-13 | Discharge: 2020-09-13 | Disposition: A | Discharge status: Home / Self Care | Payer: Medicare Other | Source: Ambulatory Visit | Attending: Family Medicine | Admitting: Family Medicine

## 2020-09-13 ENCOUNTER — Other Ambulatory Visit: Payer: Self-pay

## 2020-09-13 DIAGNOSIS — Z1231 Encounter for screening mammogram for malignant neoplasm of breast: Secondary | ICD-10-CM

## 2020-09-17 ENCOUNTER — Other Ambulatory Visit: Payer: Self-pay | Admitting: Family Medicine

## 2020-09-17 DIAGNOSIS — R928 Other abnormal and inconclusive findings on diagnostic imaging of breast: Secondary | ICD-10-CM

## 2020-09-22 ENCOUNTER — Ambulatory Visit
Admission: RE | Admit: 2020-09-22 | Discharge: 2020-09-22 | Disposition: A | Discharge status: Home / Self Care | Payer: Medicare Other | Source: Ambulatory Visit | Attending: Family Medicine | Admitting: Family Medicine

## 2020-09-22 ENCOUNTER — Other Ambulatory Visit: Payer: Self-pay

## 2020-09-22 DIAGNOSIS — N6489 Other specified disorders of breast: Secondary | ICD-10-CM | POA: Diagnosis not present

## 2020-09-22 DIAGNOSIS — R928 Other abnormal and inconclusive findings on diagnostic imaging of breast: Secondary | ICD-10-CM

## 2020-09-22 DIAGNOSIS — R922 Inconclusive mammogram: Secondary | ICD-10-CM | POA: Diagnosis not present

## 2020-10-10 DIAGNOSIS — D2261 Melanocytic nevi of right upper limb, including shoulder: Secondary | ICD-10-CM | POA: Diagnosis not present

## 2020-10-10 DIAGNOSIS — C44519 Basal cell carcinoma of skin of other part of trunk: Secondary | ICD-10-CM | POA: Diagnosis not present

## 2020-10-10 DIAGNOSIS — D225 Melanocytic nevi of trunk: Secondary | ICD-10-CM | POA: Diagnosis not present

## 2020-10-10 DIAGNOSIS — X32XXXA Exposure to sunlight, initial encounter: Secondary | ICD-10-CM | POA: Diagnosis not present

## 2020-10-10 DIAGNOSIS — L570 Actinic keratosis: Secondary | ICD-10-CM | POA: Diagnosis not present

## 2020-10-10 DIAGNOSIS — L821 Other seborrheic keratosis: Secondary | ICD-10-CM | POA: Diagnosis not present

## 2020-10-10 DIAGNOSIS — R58 Hemorrhage, not elsewhere classified: Secondary | ICD-10-CM | POA: Diagnosis not present

## 2020-10-10 DIAGNOSIS — D2271 Melanocytic nevi of right lower limb, including hip: Secondary | ICD-10-CM | POA: Diagnosis not present

## 2020-10-10 DIAGNOSIS — D485 Neoplasm of uncertain behavior of skin: Secondary | ICD-10-CM | POA: Diagnosis not present

## 2020-10-10 DIAGNOSIS — D2262 Melanocytic nevi of left upper limb, including shoulder: Secondary | ICD-10-CM | POA: Diagnosis not present

## 2020-10-10 DIAGNOSIS — D2272 Melanocytic nevi of left lower limb, including hip: Secondary | ICD-10-CM | POA: Diagnosis not present

## 2020-10-16 ENCOUNTER — Other Ambulatory Visit: Payer: Self-pay

## 2020-10-16 ENCOUNTER — Ambulatory Visit
Admission: RE | Admit: 2020-10-16 | Discharge: 2020-10-16 | Disposition: A | Discharge status: Home / Self Care | Payer: Medicare Other | Source: Ambulatory Visit | Attending: Family Medicine | Admitting: Family Medicine

## 2020-10-16 DIAGNOSIS — Z780 Asymptomatic menopausal state: Secondary | ICD-10-CM

## 2020-10-16 DIAGNOSIS — M85851 Other specified disorders of bone density and structure, right thigh: Secondary | ICD-10-CM | POA: Diagnosis not present

## 2020-10-16 DIAGNOSIS — M85832 Other specified disorders of bone density and structure, left forearm: Secondary | ICD-10-CM | POA: Diagnosis not present

## 2020-10-16 DIAGNOSIS — M8580 Other specified disorders of bone density and structure, unspecified site: Secondary | ICD-10-CM

## 2020-10-23 DIAGNOSIS — H26491 Other secondary cataract, right eye: Secondary | ICD-10-CM | POA: Diagnosis not present

## 2020-11-01 ENCOUNTER — Ambulatory Visit: Payer: Medicare Other | Admitting: Family Medicine

## 2020-11-14 DIAGNOSIS — D225 Melanocytic nevi of trunk: Secondary | ICD-10-CM | POA: Diagnosis not present

## 2020-11-14 DIAGNOSIS — D485 Neoplasm of uncertain behavior of skin: Secondary | ICD-10-CM | POA: Diagnosis not present

## 2020-11-14 DIAGNOSIS — C44519 Basal cell carcinoma of skin of other part of trunk: Secondary | ICD-10-CM | POA: Diagnosis not present

## 2020-11-29 ENCOUNTER — Other Ambulatory Visit: Payer: Self-pay

## 2020-11-29 ENCOUNTER — Telehealth (INDEPENDENT_AMBULATORY_CARE_PROVIDER_SITE_OTHER): Payer: Medicare Other | Admitting: Family Medicine

## 2020-11-29 ENCOUNTER — Encounter: Payer: Self-pay | Admitting: Family Medicine

## 2020-11-29 VITALS — Ht 62.5 in | Wt 148.0 lb

## 2020-11-29 DIAGNOSIS — M8580 Other specified disorders of bone density and structure, unspecified site: Secondary | ICD-10-CM | POA: Diagnosis not present

## 2020-11-29 NOTE — Progress Notes (Signed)
Interactive audio and video telecommunications were attempted between this provider and patient, however failed, due to patient having technical difficulties OR patient did not have access to video capability.  We continued and completed visit with audio only.   Virtual Visit via Telephone Note  I connected with patient on 11/29/20  at 10:30 AM  by telephone and verified that I am speaking with the correct person using two identifiers.  Location of patient: home   Location of MD: [place] Name of referring provider (if blank then none associated): Names per persons and role in encounter:  MD: Earlyne Iba, Patient: name listed above.    I discussed the limitations, risks, security and privacy concerns of performing an evaluation and management service by telephone and the availability of in person appointments. I also discussed with the patient that there may be a patient responsible charge related to this service. The patient expressed understanding and agreed to proceed.  CC:  History of Present Illness:   Dental crown recently put in place. She had dental f/u.  She talked about dentist about it and per patient her dentist was okay with fosamax use.    She can't get binosto covered at this point, she checked on that.    D/w pt about bisphosphonates oral vs IV vs prolia.     Observations/Objective:nad Speech normal.  Assessment and Plan:   Osteopenia, with fracture risk high enough to consider treatment.  D/w pt about DXA results and osteoporosis/osteopenia path/phys in general, including vit D and calcium.  Kidney function is adequate for bisphosphonate use.  Vitamin D is normal. Reasonable to consider treatment with bisphosphonate, ie fosamax.  Also discussed Prolia. D/w pt about risk benefit, especially GI sx, jaw and long bone pathology.   She has trouble swallowing large pills at baseline and she was corned about that.  Discussed that she can check with local  pharmacy about pill size.  She does not have dysphagia that would be a contraindication to bisphosphonate use.  She'll consider and update me as needed.    Instructions: see above.    I discussed the assessment and treatment plan with the patient. The patient was provided an opportunity to ask questions and all were answered. The patient agreed with the plan and demonstrated an understanding of the instructions.   The patient was advised to call back or seek an in-person evaluation if the symptoms worsen or if the condition fails to improve as anticipated.  I provided 27 minutes of non-face-to-face time during this encounter.  Elsie Stain, MD

## 2020-12-02 NOTE — Assessment & Plan Note (Signed)
Osteopenia, with fracture risk high enough to consider treatment.  D/w pt about DXA results and osteoporosis/osteopenia path/phys in general, including vit D and calcium.   Kidney function is adequate for bisphosphonate use.  Vitamin D is normal. Reasonable to consider treatment with bisphosphonate, ie fosamax.  Also discussed Prolia. D/w pt about risk benefit, especially GI sx, jaw and long bone pathology.   She has trouble swallowing large pills at baseline and she was corned about that.  Discussed that she can check with local pharmacy about pill size.  She does not have dysphagia that would be a contraindication to bisphosphonate use.  She'll consider and update me as needed.

## 2020-12-13 ENCOUNTER — Encounter: Payer: Self-pay | Admitting: Family Medicine

## 2020-12-27 ENCOUNTER — Other Ambulatory Visit: Payer: Self-pay

## 2020-12-27 ENCOUNTER — Encounter: Payer: Self-pay | Admitting: Family Medicine

## 2020-12-27 ENCOUNTER — Ambulatory Visit (INDEPENDENT_AMBULATORY_CARE_PROVIDER_SITE_OTHER): Payer: Medicare Other | Admitting: Family Medicine

## 2020-12-27 VITALS — BP 150/80 | HR 87 | Temp 96.8°F | Ht 63.0 in | Wt 151.0 lb

## 2020-12-27 DIAGNOSIS — M8580 Other specified disorders of bone density and structure, unspecified site: Secondary | ICD-10-CM | POA: Diagnosis not present

## 2020-12-27 DIAGNOSIS — R202 Paresthesia of skin: Secondary | ICD-10-CM

## 2020-12-27 DIAGNOSIS — M25519 Pain in unspecified shoulder: Secondary | ICD-10-CM

## 2020-12-27 LAB — BASIC METABOLIC PANEL
BUN: 21 mg/dL (ref 6–23)
CO2: 30 mEq/L (ref 19–32)
Calcium: 9.9 mg/dL (ref 8.4–10.5)
Chloride: 102 mEq/L (ref 96–112)
Creatinine, Ser: 0.84 mg/dL (ref 0.40–1.20)
GFR: 63.47 mL/min (ref >60.00)
Glucose, Bld: 96 mg/dL (ref 70–99)
Potassium: 5.1 mEq/L (ref 3.5–5.1)
Sodium: 140 mEq/L (ref 135–145)

## 2020-12-27 LAB — VITAMIN B12: Vitamin B-12: 1339 pg/mL — ABNORMAL HIGH (ref 211–911)

## 2020-12-27 LAB — TSH: TSH: 0.64 u[IU]/mL (ref 0.35–5.50)

## 2020-12-27 MED ORDER — ALENDRONATE SODIUM 10 MG PO TABS: 10.0000 mg | ORAL_TABLET | Freq: Every day | ORAL | 3 refills | Status: DC

## 2020-12-27 NOTE — Progress Notes (Signed)
This visit occurred during the SARS-CoV-2 public health emergency.  Safety protocols were in place, including screening questions prior to the visit, additional usage of staff PPE, and extensive cleaning of exam room while observing appropriate contact time as indicated for disinfecting solutions.  She had been checking BP at home.  Higher here at [place].  Prev 140s/70s, then 130s/70s at home.  It was stressful for patient coming to OV today.   She is back on B12 in the meantime.  She had more tingling in the feet in the last month.  She can feel the floor but the feeling isn't normal, she has pins and needles sensation.  Seems to be better since back on B12.  She can feel the pedals well with driving now.  No weakness.  No hand sx.  She has R > L foot sx.   Waking up with soreness on the L side of the neck, along the L trap.  Pain with ROM.  No R sided pain.  Some pain in the L upper arm, laterally.  Some occ L arm/shoulder pain with abduction L arm.  No rash.  No fevers, no chills.     We talked about her prev bone density test and options.  She had trouble swallowing some larger pills.  The daily form of foxamax is smaller and she could possibly swallow that better.  Routine bisphosphonate cautions (GI symptoms, osteonecrosis, atypical fractures, etc.) discussed with patient.  Meds, vitals, and allergies reviewed.   ROS: Per HPI unless specifically indicated in ROS section   Nad Ncat Neck supple, no LA L trapezius sore to palpation. L shoulder testing positive for impingement and supraspinatus testing.  Pain with external rotation.  No arm drop.  Normal elbow range of motion. Rrr Ctab Abd soft. Not ttp.   No foot drop.  30 minutes were devoted to patient care in this encounter (this includes time spent reviewing the patient's file/history, interviewing and examining the patient, counseling/reviewing plan with patient).

## 2020-12-27 NOTE — Patient Instructions (Signed)
Try ice vs heat for the pain in your neck and use the shoulder exercises.  Go to the lab on the way out.   If you have mychart we'll likely use that to update you.    Try fosamax after the holidays.  Update me as needed.  Take care.  Glad to see you.

## 2020-12-30 DIAGNOSIS — M25519 Pain in unspecified shoulder: Secondary | ICD-10-CM | POA: Insufficient documentation

## 2020-12-30 DIAGNOSIS — R202 Paresthesia of skin: Secondary | ICD-10-CM | POA: Insufficient documentation

## 2020-12-30 NOTE — Assessment & Plan Note (Signed)
We talked about options.  She can try starting fosamax after the holidays.  She will update me as needed.

## 2020-12-30 NOTE — Assessment & Plan Note (Signed)
She can try ice versus heat for her neck and use routine home exercise program for rotator cuff rehabilitation.  Handout given to patient and explained.  She can update me as needed.

## 2020-12-30 NOTE — Assessment & Plan Note (Signed)
She felt some better with less paresthesias since restarting B12.  Would continue as is.  See notes on labs.  No weakness.  Okay for outpatient follow-up.

## 2021-01-22 ENCOUNTER — Other Ambulatory Visit: Payer: Medicare Other

## 2021-05-23 DIAGNOSIS — D2262 Melanocytic nevi of left upper limb, including shoulder: Secondary | ICD-10-CM | POA: Diagnosis not present

## 2021-05-23 DIAGNOSIS — D2272 Melanocytic nevi of left lower limb, including hip: Secondary | ICD-10-CM | POA: Diagnosis not present

## 2021-05-23 DIAGNOSIS — L570 Actinic keratosis: Secondary | ICD-10-CM | POA: Diagnosis not present

## 2021-05-23 DIAGNOSIS — D2261 Melanocytic nevi of right upper limb, including shoulder: Secondary | ICD-10-CM | POA: Diagnosis not present

## 2021-05-23 DIAGNOSIS — S30861A Insect bite (nonvenomous) of abdominal wall, initial encounter: Secondary | ICD-10-CM | POA: Diagnosis not present

## 2021-05-23 DIAGNOSIS — X32XXXA Exposure to sunlight, initial encounter: Secondary | ICD-10-CM | POA: Diagnosis not present

## 2021-05-23 DIAGNOSIS — Z85828 Personal history of other malignant neoplasm of skin: Secondary | ICD-10-CM | POA: Diagnosis not present

## 2021-05-23 DIAGNOSIS — L821 Other seborrheic keratosis: Secondary | ICD-10-CM | POA: Diagnosis not present

## 2021-05-30 ENCOUNTER — Other Ambulatory Visit: Payer: Self-pay | Admitting: Family Medicine

## 2021-05-30 DIAGNOSIS — Z1231 Encounter for screening mammogram for malignant neoplasm of breast: Secondary | ICD-10-CM

## 2021-07-03 ENCOUNTER — Other Ambulatory Visit: Payer: Self-pay | Admitting: Family Medicine

## 2021-07-04 ENCOUNTER — Ambulatory Visit (INDEPENDENT_AMBULATORY_CARE_PROVIDER_SITE_OTHER): Payer: Medicare Other | Admitting: Family Medicine

## 2021-07-04 ENCOUNTER — Encounter: Payer: Self-pay | Admitting: Family Medicine

## 2021-07-04 VITALS — BP 138/78 | HR 81 | Temp 97.3°F | Ht 63.0 in | Wt 153.0 lb

## 2021-07-04 DIAGNOSIS — R002 Palpitations: Secondary | ICD-10-CM

## 2021-07-04 MED ORDER — METOPROLOL TARTRATE 25 MG PO TABS: 12.5000 mg | ORAL_TABLET | Freq: Two times a day (BID) | ORAL | 1 refills | Status: DC | PRN

## 2021-07-04 NOTE — Patient Instructions (Addendum)
Go to the lab on the way out.   If you have mychart we'll likely use that to update you.    I would try using metoprolol if you have any more symptoms.   We'll call about seeing cardiology.  Take care.  Glad to see you.

## 2021-07-04 NOTE — Progress Notes (Signed)
She noted heart palpitations/irregularity/extra beats on and off x several days over the last few weeks. Patient's vital signs have been normal during these times and has had no dizziness or syncope/presyncope.  No recent stressors or triggers.  Some days w/o sx, some days with more sx noted.  Sx can last for hours at a time, episodically with a few episodes per minute.  Still on thyroid medicine.  Drinking some coffee but that is at baseline.  She had similar episodes years ago but had a long period of time w/o sx.  No exertional CP.  She walked up hills yesterday w/o SOB or CP.    Meds, vitals, and allergies reviewed.   ROS: Per HPI unless specifically indicated in ROS section   GEN: nad, alert and oriented HEENT: ncat NECK: supple w/o LA CV: rrr.  PULM: ctab, no inc wob ABD: soft, +bs CWC:BJSEG BLE edema.   SKIN: no acute rash  EKG machines were down at time OV, d/w pt. she was asymptomatic at time of the visit.

## 2021-07-05 LAB — BASIC METABOLIC PANEL
BUN: 23 mg/dL (ref 6–23)
CO2: 26 mEq/L (ref 19–32)
Calcium: 9.9 mg/dL (ref 8.4–10.5)
Chloride: 101 mEq/L (ref 96–112)
Creatinine, Ser: 0.93 mg/dL (ref 0.40–1.20)
GFR: 55.96 mL/min — ABNORMAL LOW (ref >60.00)
Glucose, Bld: 101 mg/dL — ABNORMAL HIGH (ref 70–99)
Potassium: 4.6 mEq/L (ref 3.5–5.1)
Sodium: 139 mEq/L (ref 135–145)

## 2021-07-05 LAB — CBC WITH DIFFERENTIAL/PLATELET
Basophils Absolute: 0.1 10*3/uL (ref 0.0–0.1)
Basophils Relative: 0.7 % (ref 0.0–3.0)
Eosinophils Absolute: 0.1 10*3/uL (ref 0.0–0.7)
Eosinophils Relative: 0.8 % (ref 0.0–5.0)
HCT: 44.2 % (ref 36.0–46.0)
Hemoglobin: 14.7 g/dL (ref 12.0–15.0)
Lymphocytes Relative: 17.2 % (ref 12.0–46.0)
Lymphs Abs: 1.6 10*3/uL (ref 0.7–4.0)
MCHC: 33.3 g/dL (ref 30.0–36.0)
MCV: 93 fl (ref 78.0–100.0)
Monocytes Absolute: 0.7 10*3/uL (ref 0.1–1.0)
Monocytes Relative: 7.9 % (ref 3.0–12.0)
Neutro Abs: 6.8 10*3/uL (ref 1.4–7.7)
Neutrophils Relative %: 73.4 % (ref 43.0–77.0)
Platelets: 220 10*3/uL (ref 150.0–400.0)
RBC: 4.76 Mil/uL (ref 3.87–5.11)
RDW: 13.9 % (ref 11.5–15.5)
WBC: 9.3 10*3/uL (ref 4.0–10.5)

## 2021-07-05 LAB — TSH: TSH: 0.53 u[IU]/mL (ref 0.35–5.50)

## 2021-07-07 ENCOUNTER — Other Ambulatory Visit: Payer: Self-pay | Admitting: Family Medicine

## 2021-07-07 DIAGNOSIS — E785 Hyperlipidemia, unspecified: Secondary | ICD-10-CM

## 2021-07-07 DIAGNOSIS — M8580 Other specified disorders of bone density and structure, unspecified site: Secondary | ICD-10-CM

## 2021-07-07 DIAGNOSIS — E559 Vitamin D deficiency, unspecified: Secondary | ICD-10-CM

## 2021-07-07 NOTE — Assessment & Plan Note (Signed)
We were unable to get an EKG at the time of the office visit here at our clinic.  I do not think it makes sense to send the patient to a separate ambulatory care area to get an EKG since she is currently asymptomatic.  Discussed options. I would try using metoprolol if you any more symptoms.  Routine cautions given to patient We'll call about seeing cardiology.  See notes on labs.  She agrees with plan.

## 2021-07-11 ENCOUNTER — Encounter: Payer: Self-pay | Admitting: Family Medicine

## 2021-07-16 ENCOUNTER — Telehealth: Payer: Self-pay | Admitting: Family Medicine

## 2021-07-16 ENCOUNTER — Other Ambulatory Visit: Payer: Self-pay | Admitting: Family Medicine

## 2021-07-16 ENCOUNTER — Encounter: Payer: Self-pay | Admitting: Family Medicine

## 2021-07-16 NOTE — Telephone Encounter (Signed)
2 issues.  EKG was down at time of her office visit.  Please see if she can get this set up at a nurse visit in the near future.  I realize these are not normally done at nurse visits, but given the situation I think it makes sense to proceed, assuming EKG machines are up and available. Please see if her cardiology appointment can get moved sooner.  Thanks.

## 2021-07-16 NOTE — Telephone Encounter (Addendum)
Patient has appt with Dr. Damita Dunnings scheduled for 07/25/21 for CPE; will do EKG then if she can not be seen by cardio sooner. Diane Tucker can you check and she if her appt with cardio can be moved sooner? We referred patient on 07/04/21 and has appt scheduled for 08/26/21.

## 2021-07-18 ENCOUNTER — Other Ambulatory Visit (INDEPENDENT_AMBULATORY_CARE_PROVIDER_SITE_OTHER): Payer: Medicare Other

## 2021-07-18 DIAGNOSIS — E559 Vitamin D deficiency, unspecified: Secondary | ICD-10-CM | POA: Diagnosis not present

## 2021-07-18 DIAGNOSIS — M8580 Other specified disorders of bone density and structure, unspecified site: Secondary | ICD-10-CM | POA: Diagnosis not present

## 2021-07-18 DIAGNOSIS — E785 Hyperlipidemia, unspecified: Secondary | ICD-10-CM

## 2021-07-18 LAB — HEPATIC FUNCTION PANEL
ALT: 16 U/L (ref 0–35)
AST: 22 U/L (ref 0–37)
Albumin: 4.3 g/dL (ref 3.5–5.2)
Alkaline Phosphatase: 49 U/L (ref 39–117)
Bilirubin, Direct: 0.2 mg/dL (ref 0.0–0.3)
Total Bilirubin: 0.9 mg/dL (ref 0.2–1.2)
Total Protein: 6.7 g/dL (ref 6.0–8.3)

## 2021-07-18 LAB — LIPID PANEL
Cholesterol: 167 mg/dL (ref 0–200)
HDL: 59.1 mg/dL (ref >39.00)
LDL Cholesterol: 84 mg/dL (ref 0–99)
NonHDL: 107.61
Total CHOL/HDL Ratio: 3
Triglycerides: 116 mg/dL (ref 0.0–149.0)
VLDL: 23.2 mg/dL (ref 0.0–40.0)

## 2021-07-18 LAB — VITAMIN D 25 HYDROXY (VIT D DEFICIENCY, FRACTURES): VITD: 37.48 ng/mL (ref 30.00–100.00)

## 2021-07-25 ENCOUNTER — Ambulatory Visit (INDEPENDENT_AMBULATORY_CARE_PROVIDER_SITE_OTHER): Payer: Medicare Other | Admitting: Family Medicine

## 2021-07-25 ENCOUNTER — Encounter: Payer: Self-pay | Admitting: Family Medicine

## 2021-07-25 VITALS — BP 186/62 | HR 74 | Temp 97.4°F | Resp 16 | Ht 62.0 in | Wt 151.4 lb

## 2021-07-25 DIAGNOSIS — E038 Other specified hypothyroidism: Secondary | ICD-10-CM

## 2021-07-25 DIAGNOSIS — M8580 Other specified disorders of bone density and structure, unspecified site: Secondary | ICD-10-CM | POA: Diagnosis not present

## 2021-07-25 DIAGNOSIS — M6289 Other specified disorders of muscle: Secondary | ICD-10-CM

## 2021-07-25 DIAGNOSIS — E785 Hyperlipidemia, unspecified: Secondary | ICD-10-CM | POA: Diagnosis not present

## 2021-07-25 DIAGNOSIS — Z0000 Encounter for general adult medical examination without abnormal findings: Secondary | ICD-10-CM

## 2021-07-25 DIAGNOSIS — R002 Palpitations: Secondary | ICD-10-CM | POA: Diagnosis not present

## 2021-07-25 DIAGNOSIS — R030 Elevated blood-pressure reading, without diagnosis of hypertension: Secondary | ICD-10-CM

## 2021-07-25 DIAGNOSIS — Z7189 Other specified counseling: Secondary | ICD-10-CM

## 2021-07-25 MED ORDER — ALENDRONATE SODIUM 10 MG PO TABS: 10.0000 mg | ORAL_TABLET | Freq: Every day | ORAL | 3 refills | Status: DC

## 2021-07-25 NOTE — Progress Notes (Signed)
I have personally reviewed the Medicare Annual Wellness questionnaire and have noted 1. The patient's medical and social history 2. Their use of alcohol, tobacco or illicit drugs 3. Their current medications and supplements 4. The patient's functional ability including ADL's, fall risks, home safety risks and hearing or visual             impairment. 5. Diet and physical activities 6. Evidence for depression or mood disorders  The patients weight, height, BMI have been recorded in the chart and visual acuity is per eye clinic.  I have made referrals, counseling and provided education to the patient based review of the above and I have provided the pt with a written personalized care plan for preventive services.  Provider list updated- see scanned forms.  Routine anticipatory guidance given to patient.  See health maintenance. The possibility exists that previously documented standard health maintenance information may have been brought forward from a previous encounter into this note.  If needed, that same information has been updated to reflect the current situation based on today's encounter.    Flu previously done Shingles discussed with patient, previously had Zostavax. PNA previously done Tetanus 2021 COVID-vaccine previously done Colon cancer screening deferred. Breast cancer screening 2022 Bone density test 2022 Advance directive-husband designated if patient were incapacitated. Cognitive function addressed- see scanned forms- and if abnormal then additional documentation follows.   In addition to [facility] Wellness, follow up visit for the below conditions:  She checked BP at home today, 132/71.   Recheck here 150/70.  She likely has whitecoat hypertension.  Her home blood pressure readings have consistently been 120-130/60-70.  Some paresthesia in the L 5th finger, noted upon waking.  D/w pt nocturnal splint if needed.  No sx in the day, no weakness.    She started wearing  looser pants a few years ago but not recently.  No acute changes.  She has healthy diet at baseline.    Palpitations.  EKG to be done today.  She didn't tolerate metoprolol.  Had episode last night, but no symptoms currently.  Discussed about getting her cardiology appointment moved up.  She is trying to leave town on a vacation to [place] on June 25 and will return on July 5.  We talked about potentially getting an outpatient monitor set up in the meantime.  Slower urine stream.  Not burning.  She can still void well enough.  She tried kegel exercises.  She'll have a day with 4-5 BMs, instead of voiding x1 completely.  No black or bloody stools.  Not having hard stools. She doesn't have narrowing of stool.  D/w pt about pelvic floor PT and she can consider.  She can update me if she wants to go through with that.  Hypothyroidism.  Compliant.  TSH wnl.  No ADE on med.   Elevated Cholesterol: Using medications without problems: yes Muscle aches: no Diet compliance: d/w pt.   Exercise: d/w pt.    Osteopenia.  Still on fosamax.  No ADE on med.  Compliant.  Started 02/2021.    PMH and SH reviewed  Meds, vitals, and allergies reviewed.   ROS: Per HPI.  Unless specifically indicated otherwise in HPI, the patient denies:  General: fever. Eyes: acute vision changes ENT: sore throat Cardiovascular: chest pain Respiratory: SOB GI: vomiting GU: dysuria Musculoskeletal: acute back pain Derm: acute rash Neuro: acute motor dysfunction Psych: worsening mood Endocrine: polydipsia Heme: bleeding Allergy: hayfever  GEN: nad, alert and oriented HEENT: NCAT  NECK: supple w/o LA CV: rrr. PULM: ctab, no inc wob ABD: soft, +bs EXT: no edema SKIN: Well-perfused. Normal sensation in the hands.  EKG sinus rhythm without acute changes.  Discussed with patient at office visit.

## 2021-07-25 NOTE — Patient Instructions (Signed)
Don't change your meds for now and I'll update cardiology.  Take care.  Glad to see you.

## 2021-07-26 ENCOUNTER — Telehealth: Payer: Self-pay | Admitting: Family Medicine

## 2021-07-26 DIAGNOSIS — M6289 Other specified disorders of muscle: Secondary | ICD-10-CM | POA: Insufficient documentation

## 2021-07-26 NOTE — Assessment & Plan Note (Signed)
Flu previously done Shingles discussed with patient, previously had Zostavax. PNA previously done Tetanus 2021 COVID-vaccine previously done Colon cancer screening deferred. Breast cancer screening 2022 Bone density test 2022 Advance directive-husband designated if patient were incapacitated. Cognitive function addressed- see scanned forms- and if abnormal then additional documentation follows.

## 2021-07-26 NOTE — Assessment & Plan Note (Signed)
Continue atorvastatin

## 2021-07-26 NOTE — Telephone Encounter (Signed)
Please check with cardiology clinic.  I put in an order for an outpatient Zio patch.  I want to make sure this order will suffice.  She is having palpitations.  I would like to get her cardiology appointment moved sooner if at all possible.  Otherwise I like to get the patch resulted in the meantime.  Please let me know if this outpatient monitor order will not suffice and please see about getting her appointment moved sooner.  Thanks.  Please see my office note.  I greatly appreciate cardiology input.

## 2021-07-26 NOTE — Assessment & Plan Note (Signed)
Advance directive- husband designated if patient were incapacitated.  

## 2021-07-26 NOTE — Assessment & Plan Note (Signed)
Continue alendronate.

## 2021-07-26 NOTE — Telephone Encounter (Signed)
Called cardiology office and was able to move patients appt up to 08/01/21 at 3:40 pm. I also asked about the zio patch while on the phone with them and they said the order looked fine but she may not get the patch before the appt with them. Depends on insurance and if anything goes on with it.

## 2021-07-26 NOTE — Assessment & Plan Note (Signed)
Likely, discussed with patient.  She can consider pelvic floor PT.  She will let me know if she wants to go through with that.

## 2021-07-26 NOTE — Assessment & Plan Note (Signed)
Likely has whitecoat hypertension and it does not make sense to start her on medication.

## 2021-07-26 NOTE — Assessment & Plan Note (Signed)
No change in medications today.She didn't tolerate metoprolol.  Had episode last night, but no symptoms currently.  Discussed about getting her cardiology appointment moved up.  She is trying to leave town on a vacation to [place] on June 25 and will return on July 5.  We talked about potentially getting an outpatient monitor set up in the meantime.  I will check with cardiology in the meantime.

## 2021-07-26 NOTE — Assessment & Plan Note (Signed)
- 

## 2021-07-29 NOTE — Telephone Encounter (Signed)
Patient has been notified about new appt date and time and about the zio patch conversation.

## 2021-07-29 NOTE — Telephone Encounter (Signed)
Noted. Thanks.  Please update patient about that conversation with cards.

## 2021-08-01 ENCOUNTER — Ambulatory Visit (INDEPENDENT_AMBULATORY_CARE_PROVIDER_SITE_OTHER): Payer: Medicare Other | Admitting: Cardiology

## 2021-08-01 ENCOUNTER — Encounter: Payer: Self-pay | Admitting: Cardiology

## 2021-08-01 VITALS — BP 162/72 | HR 75 | Ht 62.0 in | Wt 157.4 lb

## 2021-08-01 DIAGNOSIS — R002 Palpitations: Secondary | ICD-10-CM | POA: Diagnosis not present

## 2021-08-01 DIAGNOSIS — R030 Elevated blood-pressure reading, without diagnosis of hypertension: Secondary | ICD-10-CM | POA: Diagnosis not present

## 2021-08-01 DIAGNOSIS — Z8679 Personal history of other diseases of the circulatory system: Secondary | ICD-10-CM | POA: Diagnosis not present

## 2021-08-01 NOTE — Assessment & Plan Note (Signed)
She was told as a teenager that she had mitral prolapse which I suspect may really be more related to her palpitations.  She has not had an echo done in a couple of decades.  Since that time, the criteria for mitral prolapse is changed. I do hear either an S4 gallop or with systolic click on exam and she has had a history of murmur although I did not hear murmur.  With her now having recurrent palpitations would like to exclude any progression of disease.  Plan: Check 2D echo

## 2021-08-01 NOTE — Progress Notes (Signed)
Primary Care Provider: Tonia Ghent, MD [facility] Cardiologist: None Electrophysiologist: None  Clinic Note: Chief Complaint  Patient presents with   New Patient (Initial Visit)    Palpitations no complaints today. Meds reviewed verbally with pt.   Mitral Valve Prolapse    Was told she had MVP and murmur over 20 years ago.  Last echo was 15-20 years ago.   Palpitations   ===================================  ASSESSMENT/PLAN   Problem List Items Addressed This Visit     H/O mitral valve prolapse (Chronic)    She was told as a teenager that she had mitral prolapse which I suspect may really be more related to her palpitations.  She has not had an echo done in a couple of decades.  Since that time, the criteria for mitral prolapse is changed. I do hear either an S4 gallop or with systolic click on exam and she has had a history of murmur although I did not hear murmur.  With her now having recurrent palpitations would like to exclude any progression of disease.  Plan: Check 2D echo       Elevated BP without diagnosis of hypertension (Chronic)    Thought to have whitecoat syndrome.  Blood pressure is pretty high today.  We will see how it is when she is seen in follow-up.  She did not tolerate even low-dose beta-blocker which makes me concerned about her having lower pressures at home.      Palpitations - Primary (Chronic)    She was told that she did not "tolerate metoprolol ".  This was related to the fact her heart rate went into the 40s shortly after taking it.  I think she probably can still use it PRN that is not a dangerous low heart rate for having taken a beta-blocker if it does help her symptoms.  I do not think it really will. From a diagnostic standpoint I do not think that having her wear a monitor at this point would be beneficial because she is not feeling the symptoms currently and has not for the last 2 weeks.  It is quite possible that she wears a monitor  and we do not capture anything.  There is concern for this being A-fib, but my suspicion is that this probably just PACs.  She is describing classic ectopic beats with possible bigeminy or trigeminy.  Would like to have a monitor, however at this point I think recommendation of [place] would be best. Additionally, she is leaving a week from this coming Sunday to go for 2-week trip to [place].  It would be easier for her to have a monitored technique that is not proprietary for overseas travel.       ===================================  HPI:    Diane Tucker is a 88 y.o. female with a PMH notable for HTN, HLD and thyroid disease and prior Diagnosis of Mitral Valve Prolapse/Murmur who is being seen today for the evaluation of PALPITATIONS at the request of Tonia Ghent, MD.  Diane Tucker was last seen on Jul 04, 2021 by Dr. Damita Dunnings with complaints of palpitations-noting irregular/skipped beats off and on with some forceful beats.  Has been happening for about 2 to 3 weeks.  No issues with vital signs and no associated dizziness or syncope/near syncope.  As far as she can tell no increased stressors.  Little episodes can last anywhere from a few minutes up to hours and is much as all day, but are  not usually sustained.  She does not describe her heart beating fast chest forcefully no exertional dyspnea or chest pain.  Recommended starting low-dose Lopressor 12.5 mg twice daily used initially PRN.  Defer to Cardiology.   --> She was then seen a few weeks later on 07/25/2021 noted continued palpitations but also elevated blood pressures.  Thought to be whitecoat syndrome.  It did not appear that she was able to tolerate metoprolol.  This was then stopped.  Recommended expected cardiology evaluation.  Monitor was ordered but not done.  Recent Hospitalizations: None  Reviewed  CV studies:    The following studies were reviewed today: (if available, images/films reviewed: From Epic Chart or  Care Everywhere) Per her report, echo done 15 years ago but not available on epic.:  Interval History:   Diane Tucker presents here today for cardiology evaluation of palpitations.  She tells me that back in high school actually she was diagnosed with mitral valve prolapse and she has been having intermittent episodes of palpitations/irregular heartbeats and skipped beats since that time.  Never associated with any dizziness or lightheadedness, no syncope or near syncope, TIA/amaurosis fugax.  She just feels the forceful beats which just seem to be little uncomfortable and unnerving for her because she gets anxious when they occur. She mentioned having echocardiogram done maybe 15-20 years ago (however I do not see a record in epic).  She has never been followed up by cardiology since that timeframe.  Other than feeling the palpitations off and on, she is doing well.  She describes the palpitations as about a month ago she had several weeks where she was having almost a slow heart rate with forceful beats intermittently every few beats feeling abnormal.  She did not stated that her heart rate was fast he said she exercises went slow when she took the low-dose metoprolol.  She not had any sensation of dizziness or lightheadedness associated with these palpitations.  No dyspnea.  No exacerbation with activity.  No chest pain or pressure with rest or exertion.  No PND, orthopnea or edema.  She does feel like when these episodes occur she gets anxious and nervous and that tends to make things worse. Interestingly, for the last 2 weeks she has not had symptoms.  Indicating that when she saw her PCP back in follow-up, the symptoms were improved.  REVIEWED OF SYSTEMS   Review of Systems  Constitutional:  Negative for malaise/fatigue and weight loss.  HENT:  Negative for congestion.   Respiratory:  Negative for cough and shortness of breath.   Gastrointestinal:  Negative for abdominal pain, blood in  stool, constipation and melena.  Genitourinary:  Negative for hematuria.  Musculoskeletal:  Negative for joint pain and myalgias.  Psychiatric/Behavioral:  Negative for depression and memory loss. The patient is not nervous/anxious.     I have reviewed and (if needed) personally updated the patient's problem list, medications, allergies, past medical and surgical history, social and family history.   PAST MEDICAL HISTORY   Past Medical History:  Diagnosis Date   Allergy    Arthritis    Dysrhythmia    Edema    ANKLES   GERD (gastroesophageal reflux disease)    HOH (hard of hearing)    Hyperlipidemia    Hypothyroidism    Murmur    Neuropathy    FEET   Osteopenia    Thyroid disease    Hypothyroidism   ? MVP - diagnosed in childhood   PAST  SURGICAL HISTORY   Past Surgical History:  Procedure Laterality Date   CATARACT EXTRACTION W/PHACO Right 08/27/2015   Procedure: CATARACT EXTRACTION PHACO AND INTRAOCULAR LENS PLACEMENT (IOC);  Surgeon: Estill Cotta, MD;  Location: [facility] ORS;  Service: Ophthalmology;  Laterality: Right;  [place]  01:22 AP%24.0 CDE 34.56 fluid pack lot # [id number]   DOPPLER ECHOCARDIOGRAPHY  04/1994   MVP with MR (TR)   EYE SURGERY     Holter Monitor     NSR, freq. PVC's, rare colp, freq. PAC's   NSVD     X 2   Rheumatic Fever     (?) as a child. Hosp, prolonged fever for several days.   SKIN CANCER DESTRUCTION  06/2017   Dr. Kellie Moor   TONSILLECTOMY     As a child   TOOTH EXTRACTION  02/2018    Immunization History  Administered Date(s) Administered   Hep A / Hep B 07/21/2019, 08/25/2019   Influenza Whole 11/17/2005, 12/16/2007, 11/17/2009   Influenza, High Dose Seasonal PF 11/22/2014, 11/18/2016, 12/08/2017   Influenza-Unspecified 11/17/2013, 11/18/2015, 11/18/2016, 11/02/2018, 11/21/2019, 11/23/2020   PFIZER(Purple Top)SARS-COV-2 Vaccination 03/14/2019, 04/04/2019, 11/21/2019, 05/30/2020   Pfizer Covid-19 Vaccine Bivalent Booster 29yr &  up 11/23/2020   Pneumococcal Conjugate-13 06/29/2014   Pneumococcal Polysaccharide-23 03/02/2002   Td 02/18/1996, 03/28/2008, 07/21/2019   Zoster, Live 07/03/2015    MEDICATIONS/ALLERGIES   Current Meds  Medication Sig   alendronate (FOSAMAX) 10 MG tablet Take 1 tablet (10 mg total) by mouth daily before breakfast. Take with a full glass of water on an empty stomach.   atorvastatin (LIPITOR) 10 MG tablet TAKE 1 TABLET DAILY   calcium carbonate (TUMS - DOSED IN MG ELEMENTAL CALCIUM) 500 MG chewable tablet Chew 1 tablet by mouth as needed for indigestion or heartburn.   Cholecalciferol (VITAMIN D3) 1000 units CAPS Take 2 capsules by mouth daily.    Coenzyme Q10 (COQ10) 200 MG CAPS Take 1 capsule by mouth daily.   Cranberry 240 MG CAPS Take by mouth. As needed   Cyanocobalamin (VITAMIN B 12) 500 MCG TABS daily at 6 (six) AM.   hydroxypropyl methylcellulose (ISOPTO TEARS) 2.5 % ophthalmic solution 1 drop 3 (three) times daily as needed.   loratadine (CLARITIN) 10 MG tablet Take 10 mg by mouth daily as needed for allergies.   Multiple Vitamin (MULTIVITAMIN) tablet Take 1 tablet by mouth daily.   naproxen sodium (ANAPROX) 220 MG tablet Take 220 mg by mouth as needed.   Oxymetazoline HCl (AFRIN NASAL SPRAY NA) Place into the nose.   sodium chloride (OCEAN) 0.65 % nasal spray Place 1 spray into the nose as needed.   SYNTHROID 100 MCG tablet TAKE 1 TABLET DAILY    Allergies  Allergen Reactions   Clindamycin     REACTION: Upset stomach and diarrhea   Metoprolol     Bradycardia at low dose.    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, retired from tax work   Enjoys Recruitment consultant   Family History  Problem Relation Age of Onset   Hypertension Mother    Heart disease Mother        CHF, MI   Stroke Father    Heart disease Father        CHF   Hypertension Father    Heart disease Paternal Grandmother        MI   Obesity Paternal Grandmother    Cystic fibrosis Other        3 died as infants   Cancer Paternal Aunt 39       Breast   Breast cancer Paternal Aunt    Stroke Maternal Grandmother 94   Cancer Maternal Grandfather        Lung (smoker)   Hypertension Other    Colon cancer Neg Hx     OBJCTIVE -PE, EKG, labs   Wt Readings from Last 3 Encounters:  08/01/21 157 lb 6 oz (71.4 kg)  07/25/21 151 lb 6 oz (68.7 kg)  07/04/21 153 lb (69.4 kg)    Physical Exam: BP (!) 162/72 (BP Location: Right Arm, Patient Position: Sitting, Cuff Size: Normal)   Pulse 75   Ht '5\' 2"'$  (1.575 m)   Wt 157 lb 6 oz (71.4 kg)   SpO2 98%   BMI 28.78 kg/m  Physical Exam Vitals reviewed.  Constitutional:      General: She is not in acute distress.    Appearance: Normal appearance. She is normal weight. She is not ill-appearing or toxic-appearing.  HENT:     Head: Normocephalic and atraumatic.     Ears:     Comments: Hard of hearing Cardiovascular:     Rate and Rhythm: Normal rate and regular rhythm. Occasional Extrasystoles are present.    Chest Wall: PMI is not displaced.     Pulses: Normal pulses.     Heart sounds: S1 normal. No murmur heard.    No gallop (Cannot exclude soft S4 gallop versus midsystolic click).  Pulmonary:     Effort: Pulmonary effort is normal. No respiratory distress.     Breath sounds: Normal breath sounds. No wheezing, rhonchi or rales.  Musculoskeletal:        General: No swelling. Normal range of motion.     Cervical back: Normal range of motion and neck supple.   Skin:    General: Skin is warm and dry.  Neurological:     General: No focal deficit present.     Mental Status: She is alert. Mental status is at baseline.     Cranial Nerves: No cranial nerve deficit.     Gait: Gait normal.  Psychiatric:        Mood and Affect: Mood normal.        Behavior: Behavior normal.        Thought Content: Thought content normal.        Judgment: Judgment normal.      Adult ECG Report  Rate: 75 ;  Rhythm: normal sinus rhythm; normal axis, intervals and durations  Narrative Interpretation: Normal  Recent Labs: Reviewed Lab Results  Component Value Date   CHOL 167 07/18/2021   HDL 59.10 07/18/2021   LDLCALC 84 07/18/2021   TRIG 116.0 07/18/2021   CHOLHDL 3 07/18/2021   Lab Results  Component Value Date   CREATININE 0.93 07/04/2021   BUN 23 07/04/2021   NA 139 07/04/2021   K 4.6 07/04/2021   CL  101 07/04/2021   CO2 26 07/04/2021      Latest Ref Rng & Units 07/04/2021    1:42 PM 04/04/2010    8:17 AM 03/30/2009    8:13 AM  CBC  WBC 4.0 - 10.5 K/uL 9.3  7.8  7.7   Hemoglobin 12.0 - 15.0 g/dL 14.7  15.3  14.9   Hematocrit 36.0 - 46.0 % 44.2  44.7  44.6   Platelets 150.0 - 400.0 K/uL 220.0  254.0  239.0     No results found for: "HGBA1C" Lab Results  Component Value Date   TSH 0.53 07/04/2021    ==================================================  COVID-19 Education: The signs and symptoms of COVID-19 were discussed with the patient and how to seek care for testing (follow up with PCP or arrange E-visit).    I spent a total of 18 minutes with the patient spent in direct patient consultation.  Additional time spent with chart review  / charting (studies, outside notes, etc): 20 min Total Time: 38 min  Current medicines are reviewed at length with the patient today.  (+/- concerns) she was concerned about her heart rate with metoprolol.  Should be okay taking 12.5 mg PRN, unlikely to have significant drops in heart rate.  This visit  occurred during the SARS-CoV-2 public health emergency.  Safety protocols were in place, including screening questions prior to the visit, additional usage of staff PPE, and extensive cleaning of exam room while observing appropriate contact time as indicated for disinfecting solutions.  Notice: This dictation was prepared with Dragon dictation along with smart phrase technology. Any transcriptional errors that result from this process are unintentional and may not be corrected upon review.   Studies Ordered:  Orders Placed This Encounter  Procedures   EKG 12-Lead   ECHOCARDIOGRAM COMPLETE   No orders of the defined types were placed in this encounter.   Patient Instructions / Medication Changes & Studies & Tests Ordered   Patient Instructions  Medication Instructions:  Your physician recommends that you continue on your current medications as directed. Please refer to the Current Medication list given to you today.  *If you need a refill on your cardiac medications before your next appointment, please call your pharmacy*   Lab Work: None ordered If you have labs (blood work) drawn today and your tests are completely normal, you will receive your results only by: [place] (if you have MyChart) OR A paper copy in the mail If you have any lab test that is abnormal or we need to change your treatment, we will call you to review the results.   Testing/Procedures: Your physician has requested that you have an echocardiogram. Echocardiography is a painless test that uses sound waves to create images of your heart. It provides your doctor with information about the size and shape of your heart and how well your heart's chambers and valves are working. This procedure takes approximately one hour. There are no restrictions for this procedure. (To be scheduled asap)   Follow-Up: At [facility], you and your health needs are our priority.  As part of our continuing mission to  provide you with exceptional heart care, we have created designated Provider Care Teams.  These Care Teams include your primary Cardiologist (physician) and Advanced Practice Providers (APPs -  Physician Assistants and Nurse Practitioners) who all work together to provide you with the care you need, when you need it.  We recommend signing up for the patient portal called "MyChart".  Sign  up information is provided on this After Visit Summary.  MyChart is used to connect with patients for Virtual Visits (Telemedicine).  Patients are able to view lab/test results, encounter notes, upcoming appointments, etc.  Non-urgent messages can be sent to your provider as well.   To learn more about what you can do with MyChart, go to [email or web address].    Your next appointment:   4 week(s)  The format for your next appointment:   In Person  Provider:   You may see Glenetta Hew, MD or one of the following Advanced Practice Providers on your designated Care Team:   Murray Hodgkins, NP Christell Faith, PA-C Cadence Kathlen Mody, PA-C{    Other Instructions Dr. Ellyn Hack recommends purchasing Lujean Rave to monitor your palpitations.  Important Information About Sugar          Glenetta Hew, M.D., M.S. Interventional Cardiologist   Pager # [phone] Phone # [phone] [address] [place], [place] [address]   Thank you for choosing [facility] in [place]!!

## 2021-08-01 NOTE — Telephone Encounter (Signed)
Pt being seen today 08/01/2021 Dr Ellyn Hack with [facility]

## 2021-08-01 NOTE — Assessment & Plan Note (Addendum)
Thought to have whitecoat syndrome.  Blood pressure is pretty high today.  We will see how it is when she is seen in follow-up.  She did not tolerate even low-dose beta-blocker which makes me concerned about her having lower pressures at home.

## 2021-08-01 NOTE — Assessment & Plan Note (Signed)
She was told that she did not "tolerate metoprolol ".  This was related to the fact her heart rate went into the 40s shortly after taking it.  I think she probably can still use it PRN that is not a dangerous low heart rate for having taken a beta-blocker if it does help her symptoms.  I do not think it really will. From a diagnostic standpoint I do not think that having her wear a monitor at this point would be beneficial because she is not feeling the symptoms currently and has not for the last 2 weeks.  It is quite possible that she wears a monitor and we do not capture anything.  There is concern for this being A-fib, but my suspicion is that this probably just PACs.  She is describing classic ectopic beats with possible bigeminy or trigeminy.  Would like to have a monitor, however at this point I think recommendation of [place] would be best. Additionally, she is leaving a week from this coming Sunday to go for 2-week trip to [place].  It would be easier for her to have a monitored technique that is not proprietary for overseas travel.

## 2021-08-01 NOTE — Patient Instructions (Signed)
Medication Instructions:  Your physician recommends that you continue on your current medications as directed. Please refer to the Current Medication list given to you today.  *If you need a refill on your cardiac medications before your next appointment, please call your pharmacy*   Lab Work: None ordered If you have labs (blood work) drawn today and your tests are completely normal, you will receive your results only by: [place] (if you have MyChart) OR A paper copy in the mail If you have any lab test that is abnormal or we need to change your treatment, we will call you to review the results.   Testing/Procedures: Your physician has requested that you have an echocardiogram. Echocardiography is a painless test that uses sound waves to create images of your heart. It provides your doctor with information about the size and shape of your heart and how well your heart's chambers and valves are working. This procedure takes approximately one hour. There are no restrictions for this procedure. (To be scheduled asap)   Follow-Up: At [facility], you and your health needs are our priority.  As part of our continuing mission to provide you with exceptional heart care, we have created designated Provider Care Teams.  These Care Teams include your primary Cardiologist (physician) and Advanced Practice Providers (APPs -  Physician Assistants and Nurse Practitioners) who all work together to provide you with the care you need, when you need it.  We recommend signing up for the patient portal called "MyChart".  Sign up information is provided on this After Visit Summary.  MyChart is used to connect with patients for Virtual Visits (Telemedicine).  Patients are able to view lab/test results, encounter notes, upcoming appointments, etc.  Non-urgent messages can be sent to your provider as well.   To learn more about what you can do with MyChart, go to [email or web address].    Your next  appointment:   4 week(s)  The format for your next appointment:   In Person  Provider:   You may see Glenetta Hew, MD or one of the following Advanced Practice Providers on your designated Care Team:   Murray Hodgkins, NP Christell Faith, PA-C Cadence Kathlen Mody, PA-C{    Other Instructions Dr. Ellyn Hack recommends purchasing Lujean Rave to monitor your palpitations.  Important Information About Sugar

## 2021-08-07 ENCOUNTER — Ambulatory Visit (INDEPENDENT_AMBULATORY_CARE_PROVIDER_SITE_OTHER): Payer: Medicare Other

## 2021-08-07 DIAGNOSIS — Z8679 Personal history of other diseases of the circulatory system: Secondary | ICD-10-CM | POA: Diagnosis not present

## 2021-08-07 DIAGNOSIS — I340 Nonrheumatic mitral (valve) insufficiency: Secondary | ICD-10-CM

## 2021-08-07 LAB — ECHOCARDIOGRAM COMPLETE
Area-P 1/2: 3.11 cm2
P 1/2 time: 562 msec
S' Lateral: 2.1 cm

## 2021-08-26 ENCOUNTER — Ambulatory Visit: Payer: Medicare Other | Admitting: Cardiovascular Disease

## 2021-08-29 ENCOUNTER — Ambulatory Visit (INDEPENDENT_AMBULATORY_CARE_PROVIDER_SITE_OTHER): Payer: Medicare Other | Admitting: Physician Assistant

## 2021-08-29 ENCOUNTER — Encounter: Payer: Self-pay | Admitting: Physician Assistant

## 2021-08-29 VITALS — BP 148/80 | HR 73 | Ht 62.0 in | Wt 151.0 lb

## 2021-08-29 DIAGNOSIS — R002 Palpitations: Secondary | ICD-10-CM

## 2021-08-29 DIAGNOSIS — I340 Nonrheumatic mitral (valve) insufficiency: Secondary | ICD-10-CM

## 2021-08-29 DIAGNOSIS — R030 Elevated blood-pressure reading, without diagnosis of hypertension: Secondary | ICD-10-CM

## 2021-08-29 NOTE — Progress Notes (Signed)
Cardiology Office Note    Date:  08/29/2021   ID:  Diane Tucker, DOB Aug 28, 1933, MRN [id number]  PCP:  Tonia Ghent, MD  Cardiologist:  Glenetta Hew, MD  Electrophysiologist:  None   Chief Complaint: Follow-up echo  History of Present Illness:   Diane Tucker is a 88 y.o. female with history of HTN, HLD, thyroid disease, palpitations, and prior diagnosis of mitral valve prolapse who presents for follow-up of echo.  She was evaluated as a new patient by Dr. Ellyn Hack on 08/01/2021 for evaluation of palpitations and mitral valve prolapse.  She reported being diagnosed with mitral valve prolapse back in high school and reported intermittent episodes of palpitations/irregular heartbeat since that time.  At that time, she reported a several week history of intermittent irregular/skipped beats and some "forceful beats."  She was without symptoms of dizziness, syncope, or near syncope.  Episodes were noted to last anywhere from a few minutes up to hours and as much as all day, though were typically not sustained.  She was also noted to have elevated BP which was felt to be whitecoat syndrome.  There was question that maybe her mitral valve prolapse was actually related to her palpitations.  Murmur was not noted on a cardiology exam.  With regards to her palpitations and hypertension, it was also noted she did not tolerate low-dose beta-blocker, which raised the suspicion about her having lower blood pressures at home.  Zio patch was deferred given she had not had symptoms in the preceding 2 weeks, and in the context of her having a planned trip to [place].  [name] mobile was encouraged.  Echo on 08/07/2021 showed an EF of 60 to 65%, no regional wall motion abnormalities, mild LVH, grade 1 diastolic dysfunction, normal RV systolic function and ventricular cavity size, normal PASP, grossly normal mitral valve with mild mitral regurgitation, mild aortic insufficiency, aortic valve sclerosis without  evidence of stenosis, and an estimated right atrial pressure of 3 mmHg.  She comes in doing well from a cardiac perspective and is without symptoms of angina or decompensation.  She continues to be without palpitations.  No dizziness, presyncope, or syncope.  She did pick up a Kardia mobile device and has performed a couple of tracings with this without significant arrhythmia identified.  She did not have any worrisome symptoms while out of the country visiting [place].  No orthopnea or early satiety.  Her blood pressure at home at times ranges from the 354S to 568L systolic with a reading today around 128/80.  She does not regularly watch her salt intake.   Labs independently reviewed: 07/2021 - TC 167, TG 116, HDL 59, LDL 84, albumin 4.3, AST/ALT normal, TSH normal  06/2021 - potassium 4.6, BUN 23, serum creatinine 0.93, Hgb 14.7, PLT 220  Past Medical History:  Diagnosis Date   Allergy    Arthritis    Dysrhythmia    Edema    ANKLES   GERD (gastroesophageal reflux disease)    HOH (hard of hearing)    Hyperlipidemia    Hypothyroidism    Murmur    Neuropathy    FEET   Osteopenia    Thyroid disease    Hypothyroidism    Past Surgical History:  Procedure Laterality Date   CATARACT EXTRACTION W/PHACO Right 08/27/2015   Procedure: CATARACT EXTRACTION PHACO AND INTRAOCULAR LENS PLACEMENT ([place]);  Surgeon: Estill Cotta, MD;  Location: [facility] ORS;  Service: Ophthalmology;  Laterality: Right;  [place]  01:22 AP%24.0  CDE 34.56 fluid pack lot # [id number]   DOPPLER ECHOCARDIOGRAPHY  04/1994   MVP with MR (TR)   EYE SURGERY     Holter Monitor     NSR, freq. PVC's, rare colp, freq. PAC's   NSVD     X 2   Rheumatic Fever     (?) as a child. Hosp, prolonged fever for several days.   SKIN CANCER DESTRUCTION  06/2017   Dr. Kellie Moor   TONSILLECTOMY     As a child   TOOTH EXTRACTION  02/2018    Current Medications: Current Meds  Medication Sig   alendronate (FOSAMAX) 10 MG tablet Take 1  tablet (10 mg total) by mouth daily before breakfast. Take with a full glass of water on an empty stomach.   atorvastatin (LIPITOR) 10 MG tablet TAKE 1 TABLET DAILY   calcium carbonate (TUMS - DOSED IN MG ELEMENTAL CALCIUM) 500 MG chewable tablet Chew 1 tablet by mouth as needed for indigestion or heartburn.   Cholecalciferol (VITAMIN D3) 1000 units CAPS Take 2 capsules by mouth daily.    Coenzyme Q10 (COQ10) 200 MG CAPS Take 1 capsule by mouth daily.   Cranberry 240 MG CAPS Take by mouth. As needed   Cyanocobalamin (VITAMIN B 12) 500 MCG TABS daily at 6 (six) AM.   hydroxypropyl methylcellulose (ISOPTO TEARS) 2.5 % ophthalmic solution 1 drop 3 (three) times daily as needed.   loratadine (CLARITIN) 10 MG tablet Take 10 mg by mouth daily as needed for allergies.   Multiple Vitamin (MULTIVITAMIN) tablet Take 1 tablet by mouth daily.   naproxen sodium (ANAPROX) 220 MG tablet Take 220 mg by mouth as needed.   Oxymetazoline HCl (AFRIN NASAL SPRAY NA) Place into the nose.   sodium chloride (OCEAN) 0.65 % nasal spray Place 1 spray into the nose as needed.   SYNTHROID 100 MCG tablet TAKE 1 TABLET DAILY    Allergies:   Clindamycin and Metoprolol   Social History   Socioeconomic History   Marital status: Married    Spouse name: Not on file   Number of children: 2   Years of education: Not on file   Highest education level: Not on file  Occupational History   Occupation: Public affairs consultant in past    Employer: RETIRED   Occupation: Income Tax, part-time  Tobacco Use   Smoking status: Never   Smokeless tobacco: Never  Vaping Use   Vaping Use: Never used  Substance and Sexual Activity   Alcohol use: Yes    Alcohol/week: 5.0 standard drinks of alcohol    Types: 5 Glasses of wine per week    Comment: 4-5 oz 5-6 days a week   Drug use: No   Sexual activity: Never  Other Topics Concern   Not on file  Social History Narrative   Married 1964, lives with husband, 2 adult children (one may have  schizophrenia)   Former Engineer, building services, retired from tax work   Enjoys Recruitment consultant   Social Determinants of Radio broadcast assistant Strain: [place]  (08/09/2020)   Overall Financial Resource Strain (CARDIA)    Difficulty of Paying Living Expenses: Not hard at [place]: No [place] (08/09/2020)   Hunger Vital Sign    Worried About Running Out of Food in the Last Year: Never true    [place] in the Last Year: Never true  Transportation Needs: No Transportation Needs (08/09/2020)   PRAPARE - Transportation  Lack of Transportation (Medical): No    Lack of Transportation (Non-Medical): No  Physical Activity: Insufficiently Active (08/09/2020)   Exercise Vital Sign    Days of Exercise per Week: 7 days    Minutes of Exercise per Session: 20 min  Stress: No Stress Concern Present (08/09/2020)   [place]    Feeling of Stress : Not at all  Social Connections: Not on file     Family History:  The patient's family history includes Breast cancer in her paternal aunt; Cancer in her maternal grandfather; Cancer (age of onset: 13) in her paternal aunt; Cystic fibrosis in an other family member; Heart disease in her father, mother, and paternal grandmother; Hypertension in her father, mother, and another family member; Obesity in her paternal grandmother; Stroke in her father; Stroke (age of onset: 34) in her maternal grandmother. There is no history of Colon cancer.  ROS:   12-point review of systems is negative unless otherwise noted in the HPI.   EKGs/Labs/Other Studies Reviewed:    Studies reviewed were summarized above. The additional studies were reviewed today:  2D echo 08/07/2021: 1. Left ventricular ejection fraction, by estimation, is 60 to 65%. The  left ventricle has normal function. The left ventricle has no regional  wall motion abnormalities. There is mild left  ventricular hypertrophy.  Left ventricular diastolic parameters  are consistent with Grade I diastolic dysfunction (impaired relaxation).  The average left ventricular global longitudinal strain is -19.6 %. The  global longitudinal strain is normal.   2. Right ventricular systolic function is normal. The right ventricular  size is normal. There is normal pulmonary artery systolic pressure.   3. The mitral valve is grossly normal. Mild mitral valve regurgitation.   4. The aortic valve is tricuspid. Aortic valve regurgitation is mild.  Aortic valve sclerosis/calcification is present, without any evidence of  aortic stenosis.   5. The inferior vena cava is normal in size with greater than 50%  respiratory variability, suggesting right atrial pressure of 3 mmHg.   EKG:  EKG is ordered today.  The EKG ordered today demonstrates NSR, 73 bpm, no acute ST-T changes  Recent Labs: 07/04/2021: BUN 23; Creatinine, Ser 0.93; Hemoglobin 14.7; Platelets 220.0; Potassium 4.6; Sodium 139; TSH 0.53 07/18/2021: ALT 16  Recent Lipid Panel    Component Value Date/Time   CHOL 167 07/18/2021 0821   TRIG 116.0 07/18/2021 0821   HDL 59.10 07/18/2021 0821   CHOLHDL 3 07/18/2021 0821   VLDL 23.2 07/18/2021 0821   LDLCALC 84 07/18/2021 0821    PHYSICAL EXAM:    VS:  BP (!) 148/80   Pulse 73   Ht '5\' 2"'$  (1.575 m)   Wt 151 lb (68.5 kg)   SpO2 99%   BMI 27.62 kg/m   BMI: Body mass index is 27.62 kg/m.  Physical Exam Vitals reviewed.  Constitutional:      Appearance: She is well-developed.  HENT:     Head: Normocephalic and atraumatic.  Eyes:     General:        Right eye: No discharge.        Left eye: No discharge.  Neck:     Vascular: No JVD.  Cardiovascular:     Rate and Rhythm: Normal rate and regular rhythm.     Heart sounds: Normal heart sounds, S1 normal and S2 normal. Heart sounds not distant. No midsystolic click and no opening snap. No murmur heard.  No friction rub.  Pulmonary:      Effort: Pulmonary effort is normal. No respiratory distress.     Breath sounds: Normal breath sounds. No decreased breath sounds, wheezing or rales.  Chest:     Chest wall: No tenderness.  Abdominal:     General: There is no distension.  Musculoskeletal:     Cervical back: Normal range of motion.  Skin:    General: Skin is warm and dry.     Nails: There is no clubbing.  Neurological:     Mental Status: She is alert and oriented to person, place, and time.  Psychiatric:        Speech: Speech normal.        Behavior: Behavior normal.        Thought Content: Thought content normal.        Judgment: Judgment normal.     Wt Readings from Last 3 Encounters:  08/29/21 151 lb (68.5 kg)  08/01/21 157 lb 6 oz (71.4 kg)  07/25/21 151 lb 6 oz (68.7 kg)     ASSESSMENT & PLAN:   Palpitations: Quiescent.  Given resolution of symptoms, we will continue to defer Zio patch at this time.  She will use a Kardia mobile device for now.  If palpitations return we will pursue Zio patch monitoring at that time.  Defer addition of standing beta-blocker given reported bradycardic rates on low-dose metoprolol.  History of mitral valve prolapse/mitral regurgitation: Echo last month showed mild mitral regurgitation.  Elevated blood pressure: There has been some concern for possible whitecoat hypertension.  Remains elevated in the office today, though is improved from her last visit.  She reports blood pressure readings are typically in the 741O to 878M systolic.  Not currently on antihypertensive therapy.  She will work on reducing her salt intake.  Discussed LVH and grade 1 diastolic dysfunction noted on echo with recommendation to optimize blood pressure moving forward.  We will see her back in several months time, if her blood pressure remains on the higher side at that time would look to add a low-dose ARB.    Disposition: F/u with Dr. Ellyn Hack or an APP in 4 months.   Medication Adjustments/Labs and  Tests Ordered: Current medicines are reviewed at length with the patient today.  Concerns regarding medicines are outlined above. Medication changes, Labs and Tests ordered today are summarized above and listed in the Patient Instructions accessible in Encounters.   Signed, Christell Faith, PA-C 08/29/2021 3:54 PM     [address], [place] [address] [phone]

## 2021-08-29 NOTE — Patient Instructions (Signed)
DECREASE YOUR SALT INTAKE  Medication Instructions:   Your physician recommends that you continue on your current medications as directed. Please refer to the Current Medication list given to you today.  *If you need a refill on your cardiac medications before your next appointment, please call your pharmacy*   Follow-Up: At [facility], you and your health needs are our priority.  As part of our continuing mission to provide you with exceptional heart care, we have created designated Provider Care Teams.  These Care Teams include your primary Cardiologist (physician) and Advanced Practice Providers (APPs -  Physician Assistants and Nurse Practitioners) who all work together to provide you with the care you need, when you need it.  We recommend signing up for the patient portal called "MyChart".  Sign up information is provided on this After Visit Summary.  MyChart is used to connect with patients for Virtual Visits (Telemedicine).  Patients are able to view lab/test results, encounter notes, upcoming appointments, etc.  Non-urgent messages can be sent to your provider as well.   To learn more about what you can do with MyChart, go to [email or web address].    Your next appointment:   4 month(s)  The format for your next appointment:   In Person  Provider:   You may see Glenetta Hew, MD or one of the following Advanced Practice Providers on your designated Care Team:    Christell Faith, PA-C   If primary card or EP is not listed click here to update    :1}    Other Instructions  Important Information About Sugar

## 2021-09-26 DIAGNOSIS — Z1231 Encounter for screening mammogram for malignant neoplasm of breast: Secondary | ICD-10-CM

## 2021-10-15 ENCOUNTER — Ambulatory Visit
Admission: RE | Admit: 2021-10-15 | Discharge: 2021-10-15 | Disposition: A | Discharge status: Home / Self Care | Payer: Medicare Other | Source: Ambulatory Visit | Attending: Family Medicine | Admitting: Family Medicine

## 2021-10-15 DIAGNOSIS — Z1231 Encounter for screening mammogram for malignant neoplasm of breast: Secondary | ICD-10-CM | POA: Diagnosis not present

## 2021-10-30 DIAGNOSIS — H16223 Keratoconjunctivitis sicca, not specified as Sjogren's, bilateral: Secondary | ICD-10-CM | POA: Diagnosis not present

## 2021-12-13 ENCOUNTER — Telehealth (INDEPENDENT_AMBULATORY_CARE_PROVIDER_SITE_OTHER): Payer: Medicare Other | Admitting: Family Medicine

## 2021-12-13 ENCOUNTER — Encounter: Payer: Self-pay | Admitting: Family Medicine

## 2021-12-13 DIAGNOSIS — U071 COVID-19: Secondary | ICD-10-CM

## 2021-12-13 NOTE — Progress Notes (Unsigned)
Virtual visit completed through WebEx or similar program Patient location: home  Provider location: [place] at [facility], office  Participants: Patient and me (unless stated otherwise below)  Limitations and rationale for visit method d/w patient.  Patient agreed to proceed.   CC: covid.   HPI: Sx started about 1 week ago. Had fever, loss sense of smell, cough, HA, and sinus pressure. Had positive covid test recently.  Post nasal gtt. Temp 100.5.  Minimal cough.  Still with post nasal gtt and congestion.  She is feeling better overall.  Last fever was 4 days ago.  She was able to go for a walk yesterday, about 0.25-0.5 miles  Husband is covid neg and not having sx.      She and her husband had their 59th anniversary last week.    Meds and allergies reviewed.   ROS: Per HPI unless specifically indicated in ROS section   NAD Speech wnl Voice is scratchy but speaking in complete sentences.    A/P: Covid  Supportive care Defer antivirals.   Routine cautions d/w pt.

## 2021-12-15 DIAGNOSIS — U071 COVID-19: Secondary | ICD-10-CM | POA: Insufficient documentation

## 2021-12-15 NOTE — Assessment & Plan Note (Signed)
Covid  Supportive care Defer antivirals at this point given the duration and since she is improving.  Okay for outpatient follow-up. Routine cautions d/w pt.

## 2021-12-30 NOTE — Progress Notes (Unsigned)
Cardiology Office Note    Date:  01/01/2022   ID:  Diane Tucker, DOB 10/22/1933, MRN [id number]  PCP:  Tonia Ghent, MD  Cardiologist:  Glenetta Hew, MD  Electrophysiologist:  None   Chief Complaint: Follow-up  History of Present Illness:   Diane Tucker is a 88 y.o. female with history of possible white coat hypertension, HLD, thyroid disease, palpitations, and prior diagnosis of mitral valve prolapse who presents for follow-up of elevated BP.   She was evaluated as a new patient by Dr. Ellyn Hack on 08/01/2021 for evaluation of palpitations and mitral valve prolapse.  She reported being diagnosed with mitral valve prolapse back in high school and reported intermittent episodes of palpitations/irregular heartbeat since that time.  At that time, she reported a several week history of intermittent irregular/skipped beats and some "forceful beats."  She was without symptoms of dizziness, syncope, or near syncope.  Episodes were noted to last anywhere from a few minutes up to hours and as much as all day, though were typically not sustained.  She was also noted to have elevated BP which was felt to be whitecoat syndrome.  There was question that maybe her mitral valve prolapse was actually related to her palpitations.  Murmur was not noted on a cardiology exam.  With regards to her palpitations and hypertension, it was also noted she did not tolerate low-dose beta-blocker, which raised the suspicion about her having lower blood pressures at home.  Zio patch was deferred given she had not had symptoms in the preceding 2 weeks, and in the context of her having a planned trip to [place].  [name] mobile was encouraged.  Echo on 08/07/2021 showed an EF of 60 to 65%, no regional wall motion abnormalities, mild LVH, grade 1 diastolic dysfunction, normal RV systolic function and ventricular cavity size, normal PASP, grossly normal mitral valve with mild mitral regurgitation, mild aortic insufficiency,  aortic valve sclerosis without evidence of stenosis, and an estimated right atrial pressure of 3 mmHg.  She was last seen in the office in 08/2021 and was without symptoms of angina or decompensation.  She remained without palpitations and noted no significant arrhythmias on her Kardia mobile device.  She comes in well from a cardiac perspective and is without symptoms of angina or decompensation.  No dyspnea, dizziness, presyncope, or syncope.  She does continue to note a rare and short-lived palpitation with Kardia device showing no significant arrhythmia per her report.  She has had 1 mechanical fall since her last visit and did not hit her head or suffer LOC.  Home BP readings are largely in the 120s to mid 998P systolic with a few readings in the low 382N systolic.  She was diagnosed with COVID several weeks ago, though is recovering well with some lingering fatigue.   Labs independently reviewed: 07/2021 - TC 167, TG 116, HDL 59, LDL 84, albumin 4.3, AST/ALT normal, TSH normal  06/2021 - potassium 4.6, BUN 23, serum creatinine 0.93, Hgb 14.7, PLT 220  Past Medical History:  Diagnosis Date   Allergy    Arthritis    Dysrhythmia    Edema    ANKLES   GERD (gastroesophageal reflux disease)    HOH (hard of hearing)    Hyperlipidemia    Hypothyroidism    Murmur    Neuropathy    FEET   Osteopenia    Thyroid disease    Hypothyroidism    Past Surgical History:  Procedure Laterality Date  CATARACT EXTRACTION W/PHACO Right 08/27/2015   Procedure: CATARACT EXTRACTION PHACO AND INTRAOCULAR LENS PLACEMENT (IOC);  Surgeon: Estill Cotta, MD;  Location: [facility] ORS;  Service: Ophthalmology;  Laterality: Right;  [place]  01:22 AP%24.0 CDE 34.56 fluid pack lot # [id number]   DOPPLER ECHOCARDIOGRAPHY  04/1994   MVP with MR (TR)   EYE SURGERY     Holter Monitor     NSR, freq. PVC's, rare colp, freq. PAC's   NSVD     X 2   Rheumatic Fever     (?) as a child. Hosp, prolonged fever for several  days.   SKIN CANCER DESTRUCTION  06/2017   Dr. Kellie Moor   TONSILLECTOMY     As a child   TOOTH EXTRACTION  02/2018    Current Medications: Current Meds  Medication Sig   alendronate (FOSAMAX) 10 MG tablet Take 1 tablet (10 mg total) by mouth daily before breakfast. Take with a full glass of water on an empty stomach.   atorvastatin (LIPITOR) 10 MG tablet TAKE 1 TABLET DAILY   calcium carbonate (TUMS - DOSED IN MG ELEMENTAL CALCIUM) 500 MG chewable tablet Chew 1 tablet by mouth as needed for indigestion or heartburn.   Cholecalciferol (VITAMIN D3) 1000 units CAPS Take 2 capsules by mouth daily.    Coenzyme Q10 (COQ10) 200 MG CAPS Take 1 capsule by mouth daily.   Cyanocobalamin (VITAMIN B 12) 500 MCG TABS daily at 6 (six) AM.   hydroxypropyl methylcellulose (ISOPTO TEARS) 2.5 % ophthalmic solution 1 drop 3 (three) times daily as needed.   loratadine (CLARITIN) 10 MG tablet Take 10 mg by mouth daily as needed for allergies.   Multiple Vitamin (MULTIVITAMIN) tablet Take 1 tablet by mouth daily.   naproxen sodium (ALEVE) 220 MG tablet Take 220 mg by mouth daily as needed.   Oxymetazoline HCl (AFRIN NASAL SPRAY NA) Place into the nose.   sodium chloride (OCEAN) 0.65 % nasal spray Place 1 spray into the nose as needed.   SYNTHROID 100 MCG tablet TAKE 1 TABLET DAILY    Allergies:   Clindamycin and Metoprolol   Social History   Socioeconomic History   Marital status: Married    Spouse name: Not on file   Number of children: 2   Years of education: Not on file   Highest education level: Not on file  Occupational History   Occupation: Public affairs consultant in past    Employer: RETIRED   Occupation: Income Tax, part-time  Tobacco Use   Smoking status: Never   Smokeless tobacco: Never  Vaping Use   Vaping Use: Never used  Substance and Sexual Activity   Alcohol use: Yes    Alcohol/week: 5.0 standard drinks of alcohol    Types: 5 Glasses of wine per week    Comment: 4-5 oz 5-6 days a  week   Drug use: No   Sexual activity: Never  Other Topics Concern   Not on file  Social History Narrative   Married 1964, lives with husband, 2 adult children (one may have schizophrenia)   Former Engineer, building services, retired from tax work   Enjoys Recruitment consultant   Social Determinants of Radio broadcast assistant Strain: [place]  (08/09/2020)   Overall Financial Resource Strain ([place])    Difficulty of Paying Living Expenses: Not hard at [place]: No [place] (08/09/2020)   Hunger Vital Sign    Worried About [place] in the Last Year: Never  true    Ran Out of Food in the Last Year: Never true  Transportation Needs: No Transportation Needs (08/09/2020)   PRAPARE - Hydrologist (Medical): No    Lack of Transportation (Non-Medical): No  Physical Activity: Insufficiently Active (08/09/2020)   Exercise Vital Sign    Days of Exercise per Week: 7 days    Minutes of Exercise per Session: 20 min  Stress: No Stress Concern Present (08/09/2020)   [place]    Feeling of Stress : Not at all  Social Connections: Not on file     Family History:  The patient's family history includes Breast cancer in her paternal aunt; Cancer in her maternal grandfather; Cancer (age of onset: 66) in her paternal aunt; Cystic fibrosis in an other family member; Heart disease in her father, mother, and paternal grandmother; Hypertension in her father, mother, and another family member; Obesity in her paternal grandmother; Stroke in her father; Stroke (age of onset: 50) in her maternal grandmother. There is no history of Colon cancer.  ROS:   12-point review of systems is negative unless otherwise noted in HPI   EKGs/Labs/Other Studies Reviewed:    Studies reviewed were summarized above. The additional studies were reviewed today: 2D echo 08/07/2021: 1. Left ventricular  ejection fraction, by estimation, is 60 to 65%. The  left ventricle has normal function. The left ventricle has no regional  wall motion abnormalities. There is mild left ventricular hypertrophy.  Left ventricular diastolic parameters  are consistent with Grade I diastolic dysfunction (impaired relaxation).  The average left ventricular global longitudinal strain is -19.6 %. The  global longitudinal strain is normal.   2. Right ventricular systolic function is normal. The right ventricular  size is normal. There is normal pulmonary artery systolic pressure.   3. The mitral valve is grossly normal. Mild mitral valve regurgitation.   4. The aortic valve is tricuspid. Aortic valve regurgitation is mild.  Aortic valve sclerosis/calcification is present, without any evidence of  aortic stenosis.   5. The inferior vena cava is normal in size with greater than 50%  respiratory variability, suggesting right atrial pressure of 3 mmHg.   EKG:  EKG is not ordered today.    Recent Labs: 07/04/2021: BUN 23; Creatinine, Ser 0.93; Hemoglobin 14.7; Platelets 220.0; Potassium 4.6; Sodium 139; TSH 0.53 07/18/2021: ALT 16  Recent Lipid Panel    Component Value Date/Time   CHOL 167 07/18/2021 0821   TRIG 116.0 07/18/2021 0821   HDL 59.10 07/18/2021 0821   CHOLHDL 3 07/18/2021 0821   VLDL 23.2 07/18/2021 0821   LDLCALC 84 07/18/2021 0821    PHYSICAL EXAM:    VS:  BP 139/74 (BP Location: Left Arm, Patient Position: Sitting, Cuff Size: Normal)   Pulse 72   Ht '5\' 2"'$  (1.575 m)   Wt 151 lb 9.6 oz (68.8 kg)   SpO2 100%   BMI 27.73 kg/m   BMI: Body mass index is 27.73 kg/m.  Physical Exam Vitals reviewed.  Constitutional:      Appearance: She is well-developed.  HENT:     Head: Normocephalic and atraumatic.  Eyes:     General:        Right eye: No discharge.        Left eye: No discharge.  Neck:     Vascular: No JVD.  Cardiovascular:     Rate and Rhythm: Normal rate and regular rhythm.  Heart sounds: Normal heart sounds, S1 normal and S2 normal. Heart sounds not distant. No midsystolic click and no opening snap. No murmur heard.    No friction rub.  Pulmonary:     Effort: Pulmonary effort is normal. No respiratory distress.     Breath sounds: Normal breath sounds. No decreased breath sounds, wheezing or rales.  Chest:     Chest wall: No tenderness.  Abdominal:     General: There is no distension.  Musculoskeletal:     Cervical back: Normal range of motion.  Skin:    General: Skin is warm and dry.     Nails: There is no clubbing.  Neurological:     Mental Status: She is alert and oriented to person, place, and time.  Psychiatric:        Speech: Speech normal.        Behavior: Behavior normal.        Thought Content: Thought content normal.        Judgment: Judgment normal.     Wt Readings from Last 3 Encounters:  01/01/22 151 lb 9.6 oz (68.8 kg)  12/13/21 145 lb (65.8 kg)  08/29/21 151 lb (68.5 kg)     ASSESSMENT & PLAN:   Palpitations: Quiescent.  Given stable and sporadic symptoms, Zio patch has been deferred.  She will continue to use her Kardia mobile device for now and notify us if symptoms become more prevalent.  Defer addition of standing beta-blocker given bradycardic rates noted with prior beta-blocker usage.  History of mitral valve prolapse/mitral regurgitation: Echo in 07/2021 showed mild mitral regurgitation.  Continue to follow periodically.  Elevated blood pressure: Blood pressure is reasonably controlled in the office today and has been well controlled at home.  Continue low-sodium diet.  HLD: LDL 84.  She remains on atorvastatin 10 mg.  Followed by PCP.    Disposition: F/u with Dr. Ellyn Hack or an APP in 6 months.   Medication Adjustments/Labs and Tests Ordered: Current medicines are reviewed at length with the patient today.  Concerns regarding medicines are outlined above. Medication changes, Labs and Tests ordered today are summarized  above and listed in the Patient Instructions accessible in Encounters.   Signed, Christell Faith, PA-C 01/01/2022 9:56 AM     [address] [place], [place] [address] [phone]

## 2022-01-01 ENCOUNTER — Encounter: Payer: Self-pay | Admitting: Physician Assistant

## 2022-01-01 ENCOUNTER — Ambulatory Visit: Payer: Medicare Other | Attending: Cardiology | Admitting: Physician Assistant

## 2022-01-01 VITALS — BP 139/74 | HR 72 | Ht 62.0 in | Wt 151.6 lb

## 2022-01-01 DIAGNOSIS — R002 Palpitations: Secondary | ICD-10-CM | POA: Insufficient documentation

## 2022-01-01 DIAGNOSIS — E782 Mixed hyperlipidemia: Secondary | ICD-10-CM | POA: Insufficient documentation

## 2022-01-01 DIAGNOSIS — R030 Elevated blood-pressure reading, without diagnosis of hypertension: Secondary | ICD-10-CM | POA: Diagnosis not present

## 2022-01-01 DIAGNOSIS — Z8679 Personal history of other diseases of the circulatory system: Secondary | ICD-10-CM | POA: Diagnosis not present

## 2022-01-01 DIAGNOSIS — I340 Nonrheumatic mitral (valve) insufficiency: Secondary | ICD-10-CM | POA: Insufficient documentation

## 2022-01-01 NOTE — Patient Instructions (Signed)
Medication Instructions:  No changes at this time.   *If you need a refill on your cardiac medications before your next appointment, please call your pharmacy*   Lab Work: None  If you have labs (blood work) drawn today and your tests are completely normal, you will receive your results only by: [place] (if you have MyChart) OR A paper copy in the mail If you have any lab test that is abnormal or we need to change your treatment, we will call you to review the results.   Testing/Procedures: None   Follow-Up: At [facility], you and your health needs are our priority.  As part of our continuing mission to provide you with exceptional heart care, we have created designated Provider Care Teams.  These Care Teams include your primary Cardiologist (physician) and Advanced Practice Providers (APPs -  Physician Assistants and Nurse Practitioners) who all work together to provide you with the care you need, when you need it.   Your next appointment:   6 month(s)  The format for your next appointment:   In Person  Provider:   Glenetta Hew, MD or Christell Faith, PA-C        Important Information About Sugar

## 2022-01-02 ENCOUNTER — Ambulatory Visit: Payer: Medicare Other | Admitting: Cardiology

## 2022-05-29 DIAGNOSIS — L820 Inflamed seborrheic keratosis: Secondary | ICD-10-CM | POA: Diagnosis not present

## 2022-05-29 DIAGNOSIS — D2262 Melanocytic nevi of left upper limb, including shoulder: Secondary | ICD-10-CM | POA: Diagnosis not present

## 2022-05-29 DIAGNOSIS — R208 Other disturbances of skin sensation: Secondary | ICD-10-CM | POA: Diagnosis not present

## 2022-05-29 DIAGNOSIS — D225 Melanocytic nevi of trunk: Secondary | ICD-10-CM | POA: Diagnosis not present

## 2022-05-29 DIAGNOSIS — X32XXXA Exposure to sunlight, initial encounter: Secondary | ICD-10-CM | POA: Diagnosis not present

## 2022-05-29 DIAGNOSIS — L538 Other specified erythematous conditions: Secondary | ICD-10-CM | POA: Diagnosis not present

## 2022-05-29 DIAGNOSIS — L570 Actinic keratosis: Secondary | ICD-10-CM | POA: Diagnosis not present

## 2022-05-29 DIAGNOSIS — D485 Neoplasm of uncertain behavior of skin: Secondary | ICD-10-CM | POA: Diagnosis not present

## 2022-05-29 DIAGNOSIS — D2261 Melanocytic nevi of right upper limb, including shoulder: Secondary | ICD-10-CM | POA: Diagnosis not present

## 2022-05-29 DIAGNOSIS — L821 Other seborrheic keratosis: Secondary | ICD-10-CM | POA: Diagnosis not present

## 2022-05-29 DIAGNOSIS — D2272 Melanocytic nevi of left lower limb, including hip: Secondary | ICD-10-CM | POA: Diagnosis not present

## 2022-05-29 DIAGNOSIS — D2271 Melanocytic nevi of right lower limb, including hip: Secondary | ICD-10-CM | POA: Diagnosis not present

## 2022-06-03 IMAGING — MG MM DIGITAL SCREENING BILAT W/ TOMO AND CAD
6 of 10 series · 6 of 30 positions shown · non-contrast
Comparison: Previous exam(s).

CLINICAL DATA: Screening.

EXAM:
DIGITAL SCREENING BILATERAL MAMMOGRAM WITH TOMOSYNTHESIS AND CAD
TECHNIQUE: Bilateral screening digital craniocaudal and mediolateral oblique
mammograms were obtained. Bilateral screening digital breast
tomosynthesis was performed. The images were evaluated with
computer-aided detection.

[L MLO synth-2D]
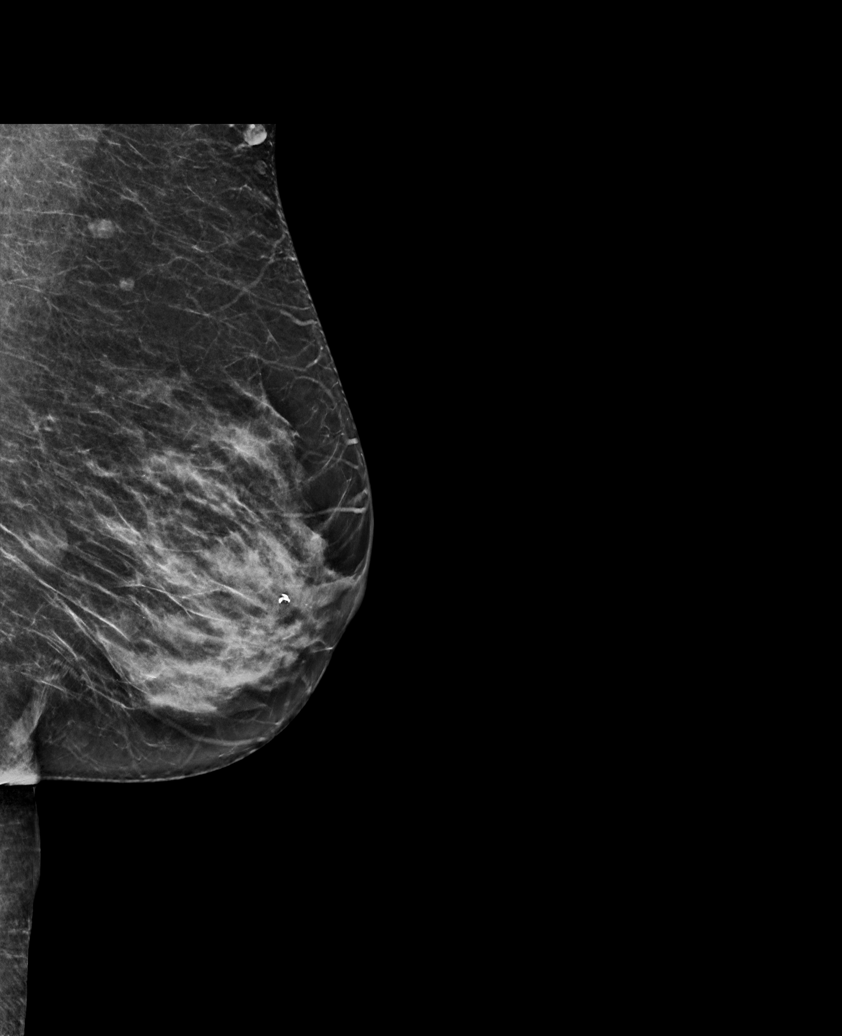

[L CC synth-2D]
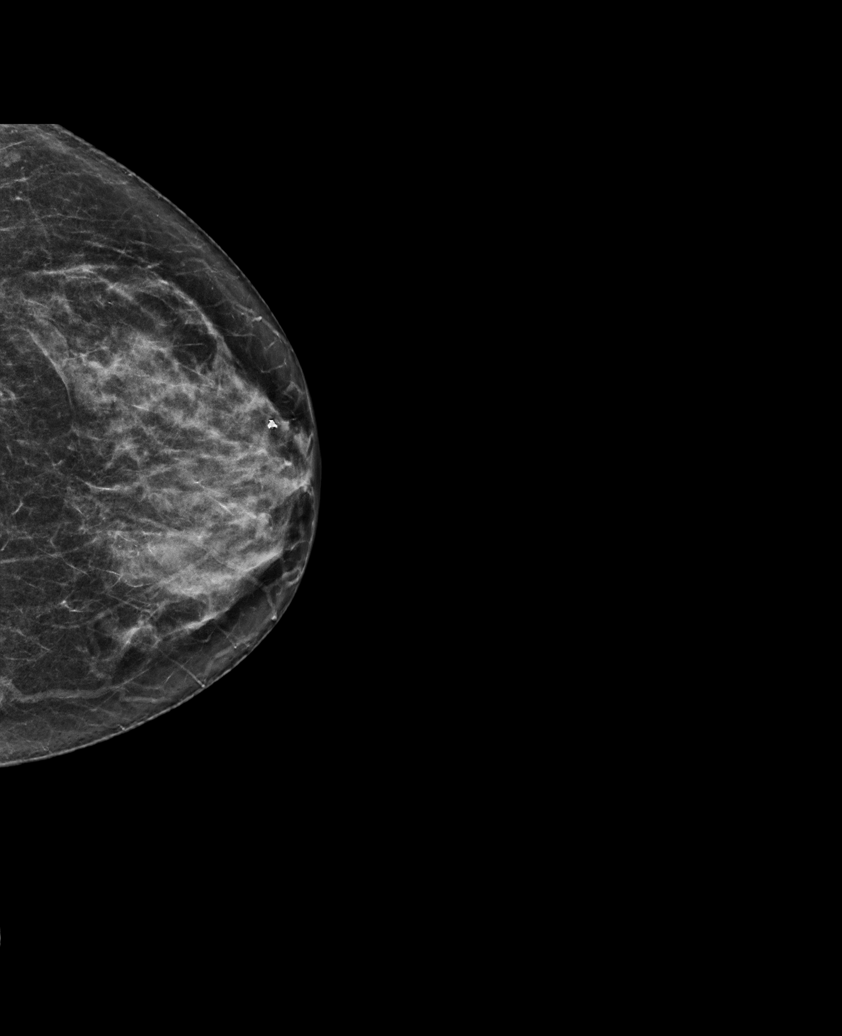

[R CC synth-2D (1 of 2)]
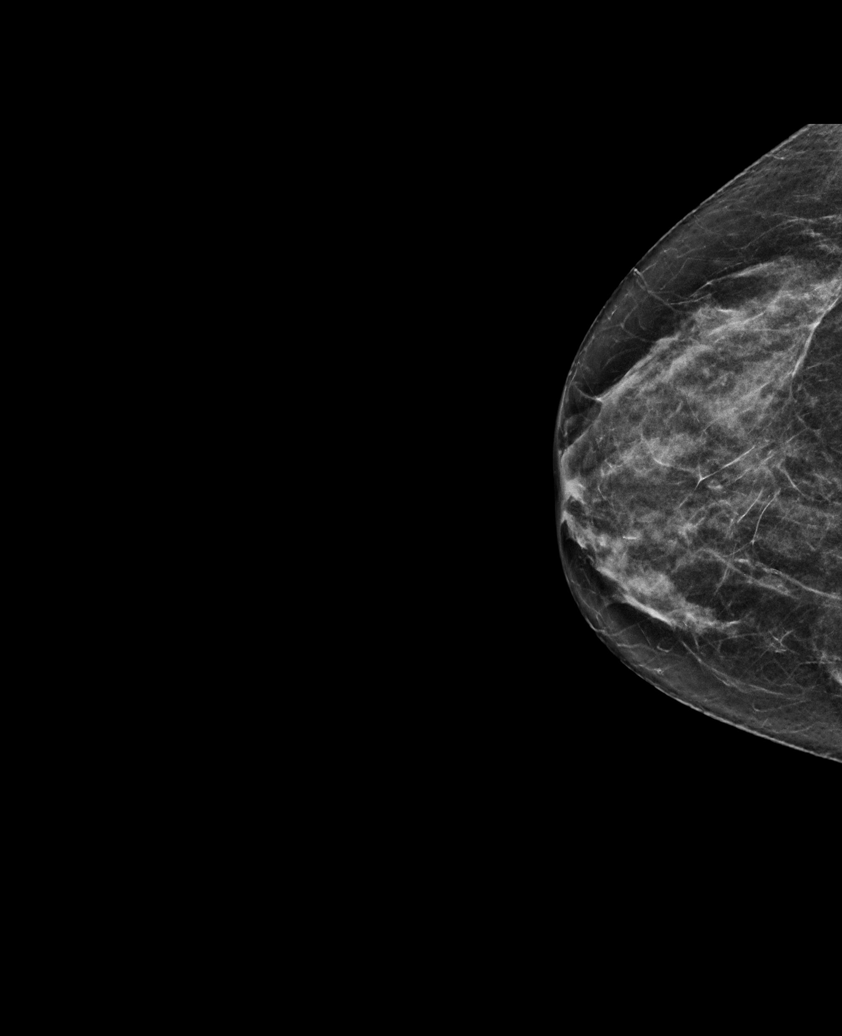

[R CC synth-2D (2 of 2)]
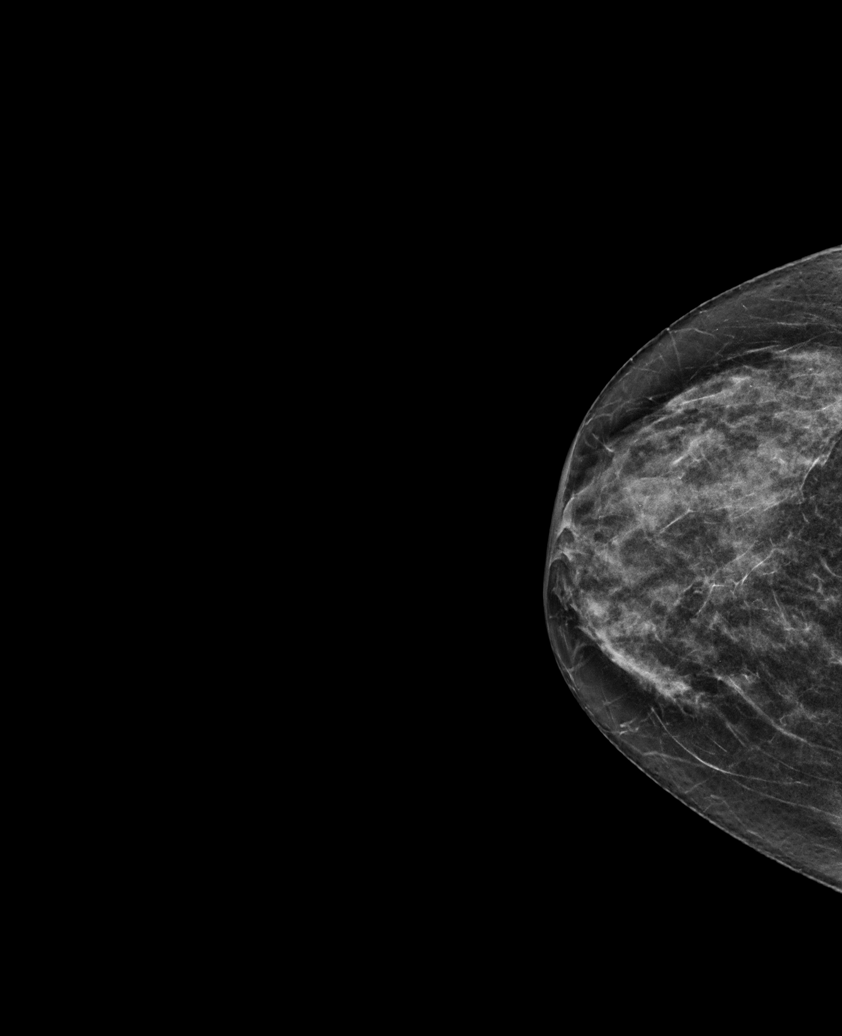

[R MLO synth-2D]
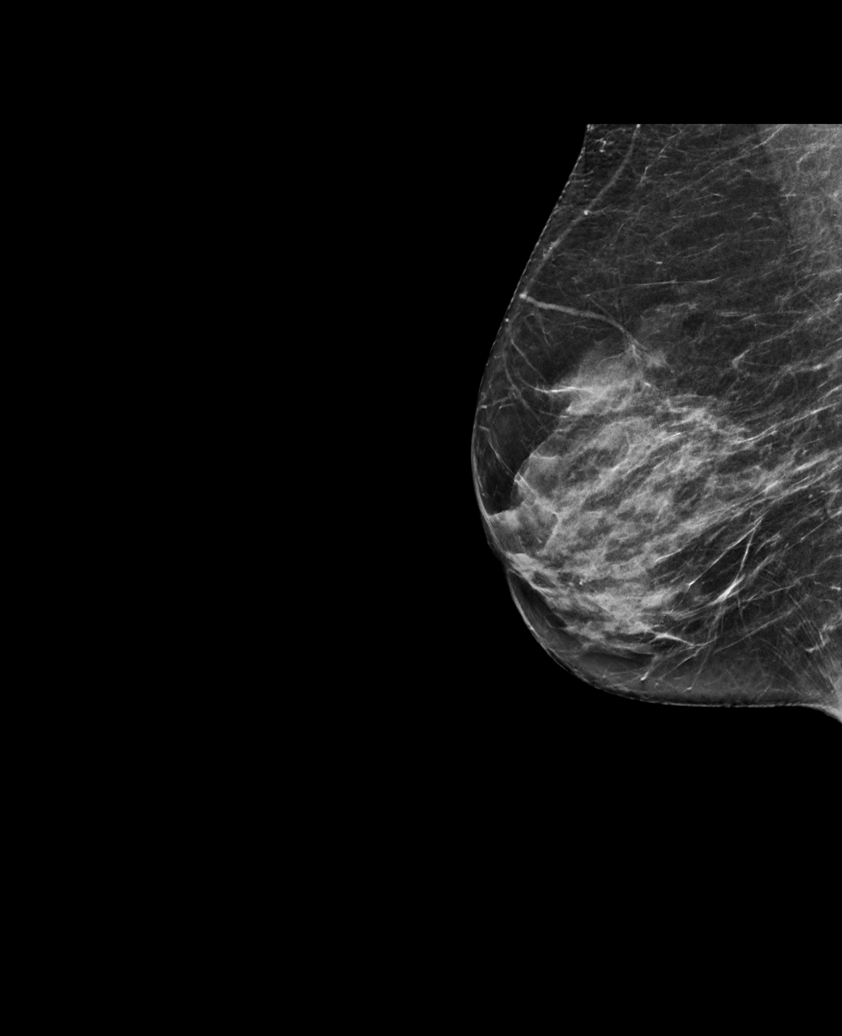

[R MLO tomo · tomo slice 33/65.0]
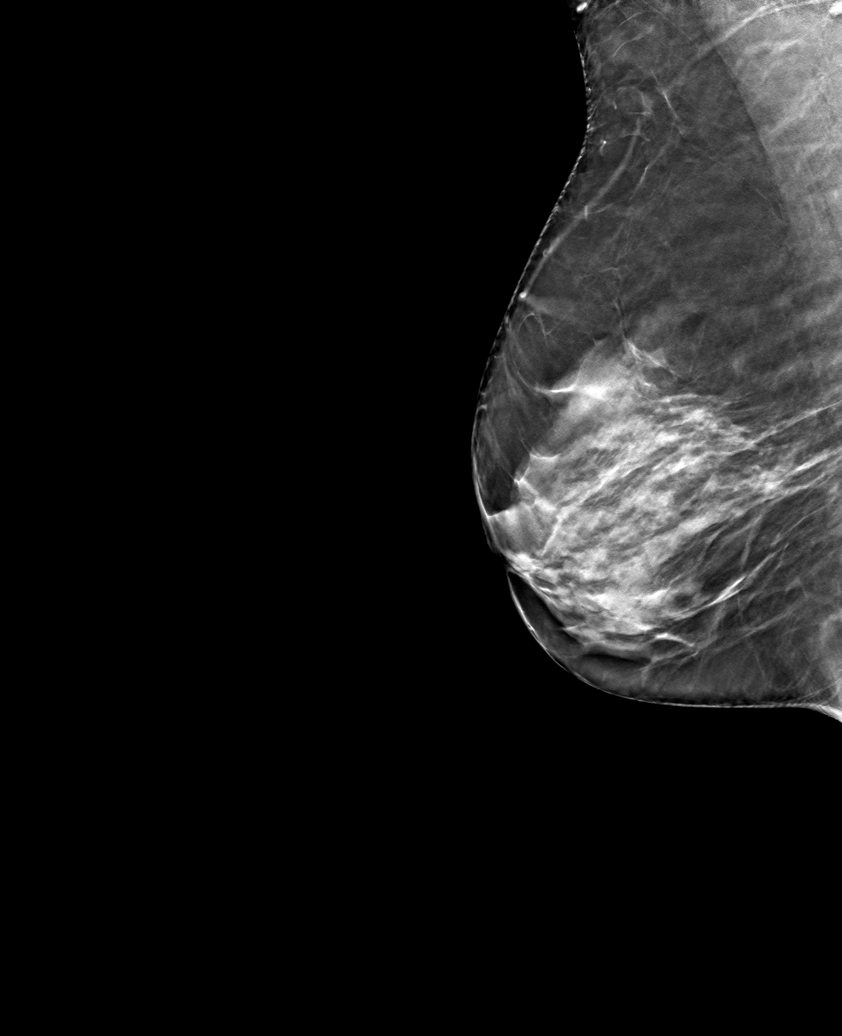

[6 of 30 positions shown; findings below may reference images not displayed]

ACR Breast Density Category c: The breast tissue is heterogeneously
dense, which may obscure small masses.
FINDINGS: In the left breast, a possible mass with distortion warrants further
evaluation. This possible mass with distortion is seen within the
upper LEFT breast, MLO view only, slice 30.

In the right breast, no findings suspicious for malignancy.
IMPRESSION: Further evaluation is suggested for a possible mass with distortion
in the left breast.

RECOMMENDATION:
Diagnostic mammogram and possibly ultrasound of the left breast.
(Code:[id number])

The patient will be contacted regarding the findings, and additional
imaging will be scheduled.

BI-RADS CATEGORY  0: Incomplete. Need additional imaging evaluation
and/or prior mammograms for comparison.

## 2022-06-23 ENCOUNTER — Other Ambulatory Visit: Payer: Self-pay | Admitting: Family Medicine

## 2022-06-26 ENCOUNTER — Ambulatory Visit (INDEPENDENT_AMBULATORY_CARE_PROVIDER_SITE_OTHER): Payer: Medicare Other | Admitting: Family Medicine

## 2022-06-26 VITALS — BP 140/78 | HR 78 | Temp 97.5°F | Ht 62.0 in | Wt 150.0 lb

## 2022-06-26 DIAGNOSIS — R9389 Abnormal findings on diagnostic imaging of other specified body structures: Secondary | ICD-10-CM | POA: Diagnosis not present

## 2022-06-26 DIAGNOSIS — M8580 Other specified disorders of bone density and structure, unspecified site: Secondary | ICD-10-CM | POA: Diagnosis not present

## 2022-06-26 NOTE — Patient Instructions (Signed)
If the dentist has specific advice about stopping fosamax, then let me know.   If you have more sinus pain, then restart amoxil.  If not better, then let me know.   We can see about a repeat bone density test later on this year- we can talk at your routine visit.  Take care.  Glad to see you.

## 2022-06-26 NOTE — Progress Notes (Signed)
She may need a dental extraction.  D/w pt about fosamax use.  No ADE on med at this point.  She had root canal recently.   I asked her to check with the dental clinic to see if they had specific instructions about her Fosamax use.  D/w pt about dental eval and sinus findings on outside imaging.  She still has some dental pain on the L upper side, near the max sinus.  No ear pain.  Some sneezing, unclear if allergy related.    She reports having recent dental x-rays that showed a possible sinus infection in the left maxillary area.  Her dentist advised her since the x-ray showed some persistent sinus congestion that she come in for evaluation.  She took 1 week of amoxil about 1 month ago.    We talked about dental x-ray exposure and that is a low-dose of radiation that should not be hazardous.  Meds, vitals, and allergies reviewed.   ROS: Per HPI unless specifically indicated in ROS section   Nad Ncat Neck supple, no LA Rrr ctab Sinuses not ttp now x4.  R canal with wax but L TM wnl.  MMM OP without erythema

## 2022-06-29 DIAGNOSIS — R9389 Abnormal findings on diagnostic imaging of other specified body structures: Secondary | ICD-10-CM | POA: Insufficient documentation

## 2022-06-29 NOTE — Assessment & Plan Note (Signed)
I asked her to check with the dental clinic to see if they had specific instructions about her Fosamax use. We can see about a repeat bone density test later on this year- we can talk at her routine visit.

## 2022-06-29 NOTE — Assessment & Plan Note (Signed)
With reported sinus abnormality on dental x-rays. No sinus tenderness now.  Discussed options. Would hold treatment for now but if more sinus pain, then restart amoxil.  If not better, then let me know.   She agrees to plan.  Okay for outpatient follow-up.

## 2022-07-07 ENCOUNTER — Other Ambulatory Visit: Payer: Self-pay | Admitting: Family Medicine

## 2022-07-07 DIAGNOSIS — Z1231 Encounter for screening mammogram for malignant neoplasm of breast: Secondary | ICD-10-CM

## 2022-07-10 ENCOUNTER — Ambulatory Visit: Payer: Medicare Other | Attending: Cardiology | Admitting: Cardiology

## 2022-07-10 ENCOUNTER — Encounter: Payer: Self-pay | Admitting: Cardiology

## 2022-07-10 VITALS — BP 140/62 | HR 67 | Ht 62.0 in | Wt 150.0 lb

## 2022-07-10 DIAGNOSIS — Z8679 Personal history of other diseases of the circulatory system: Secondary | ICD-10-CM | POA: Diagnosis not present

## 2022-07-10 DIAGNOSIS — R002 Palpitations: Secondary | ICD-10-CM | POA: Insufficient documentation

## 2022-07-10 DIAGNOSIS — R030 Elevated blood-pressure reading, without diagnosis of hypertension: Secondary | ICD-10-CM

## 2022-07-10 NOTE — Patient Instructions (Signed)
Medication Instructions:  Your physician recommends that you continue on your current medications as directed. Please refer to the Current Medication list given to you today.  *If you need a refill on your cardiac medications before your next appointment, please call your pharmacy*  Lab Work: -None ordered If you have labs (blood work) drawn today and your tests are completely normal, you will receive your results only by: MyChart Message (if you have MyChart) OR A paper copy in the mail If you have any lab test that is abnormal or we need to change your treatment, we will call you to review the results.  Testing/Procedures: -None ordered  Follow-Up: At [facility], you and your health needs are our priority.  As part of our continuing mission to provide you with exceptional heart care, we have created designated Provider Care Teams.  These Care Teams include your primary Cardiologist (physician) and Advanced Practice Providers (APPs -  Physician Assistants and Nurse Practitioners) who all work together to provide you with the care you need, when you need it.  Your next appointment:   Follow-up as needed   Provider:   Bryan Lemma, MD    Other Instructions -None

## 2022-07-10 NOTE — Progress Notes (Signed)
Primary Care Provider: Joaquim Nam, MD [facility] Cardiologist: Bryan Lemma, MD Electrophysiologist: None  Clinic Note: Chief Complaint  Patient presents with   Follow-up    No complaints today. Previously had skipped beats.     ===================================  ASSESSMENT/PLAN   Problem List Items Addressed This Visit     Palpitations (Chronic)    Well-controlled.  No longer really present.  Did not tolerate beta-blocker in the past.  Would prefer to avoid AV nodal agents at this point.      Relevant Orders   EKG 12-Lead   H/O mitral valve prolapse - Primary (Chronic)    No exam findings to suggest MR or MVP.  Echocardiogram not show any evidence of MVP and there is only trivial MR.      Relevant Orders   EKG 12-Lead   Elevated BP without diagnosis of hypertension (Chronic)    She probably does have may be class I hypertension but her blood pressure range at home was pretty solid between 116/64 to 140/73.  I would be very reluctant to be aggressive in treating blood pressure in this elderly woman with no concerning CHF symptoms..      Relevant Orders   EKG 12-Lead    ===================================  HPI:    Diane Tucker is a 89 y.o. female with a PMH notable for HTN, HLD and thyroid disease as well as "History of Mitral Valve Prolapse "diagnosed as part of Palpitations assessment who presents today for 55-month follow-up.  Diane Tucker was first seen on August 01, 2021.  She was told as a teenager that she had mitral valve prolapse and that was the cause of her palpitations.  Will check 2D echocardiogram to assess.  She also had elevated blood pressures.  Was noted that she did not tolerate low-dose beta-blocker for palpitations.  Her heart rates were in the 40s.  Recommendation was using a PRN.  Recommended Kardia-Mobile. -> Seen for 1 month follow-up by Eula Listen, PA to discuss results of echo.  She did pick up the Kardia-Mobile.   Palpitations are stable.  Echocardiogram reviewed.  Recent Hospitalizations: None  She was seen for 94-month follow-up by Eula Listen, PA on January 01, 2022.  Doing well from cardiac perspective.  She was quite Active.  No arrhythmias on Kardia-Mobile.  1 mechanical fall.  Home BPs were in the 120s to 130s systolic.  Had recent diagnosis of COVID.  Overall stable.  Reviewed  CV studies:    The following studies were reviewed today: (if available, images/films reviewed: From Epic Chart or Care Everywhere) TTE 08/07/2021:: EF 60 to 65%.  No RWMA.  GR 1 DD.  Normal RV.  Mild MR but no comment on prolapse.  Aortic valve sclerosis with no stenosis.  Interval History:   Diane Tucker returns here today overall doing fairly well.  No major issues.  She has her blood pressure log which shows recordings ranging from 116/60 4-1 40 over 70 mmHg.  Most of them are in the range that was 120s to 130s mmHg range.  She states that her palpitations are very well-controlled just off and on short little spells but nothing that bothers her.  She is not having any significant symptoms of lightheadedness or dizziness associated with palpitations or her blood pressure.  No headaches or blurred vision.  No chest pain or pressure with rest or exertion.  CV Review of Symptoms (Summary) no chest pain or dyspnea on exertion positive  for - not unexpectedly, somewhat unsteady gait and balance issues.  She is active but does not overdo it. negative for - edema, loss of consciousness, orthopnea, paroxysmal nocturnal dyspnea, rapid heart rate, shortness of breath, or lightheadedness, dizziness or wooziness, syncope/near syncope or TIA/emesis fugax, claudication  REVIEWED OF SYSTEMS   Review of Systems  Constitutional:  Negative for fever, malaise/fatigue and weight loss.  HENT:  Negative for congestion.   Respiratory:  Negative for cough and shortness of breath.   Gastrointestinal:  Negative for blood in stool and melena.   Genitourinary:  Negative for hematuria.  Musculoskeletal:  Positive for back pain and joint pain.  Neurological:  Negative for dizziness and focal weakness.  Psychiatric/Behavioral:  Positive for memory loss. Negative for depression. The patient is not nervous/anxious and does not have insomnia.    I have reviewed and (if needed) personally updated the patient's problem list, medications, allergies, past medical and surgical history, social and family history.   PAST MEDICAL HISTORY   Past Medical History:  Diagnosis Date   Allergy    Arthritis    Dysrhythmia    Edema    ANKLES   GERD (gastroesophageal reflux disease)    HOH (hard of hearing)    Hyperlipidemia    Hypothyroidism    Murmur    Neuropathy    FEET   Osteopenia    Thyroid disease    Hypothyroidism    PAST SURGICAL HISTORY   Past Surgical History:  Procedure Laterality Date   CATARACT EXTRACTION W/PHACO Right 08/27/2015   Procedure: CATARACT EXTRACTION PHACO AND INTRAOCULAR LENS PLACEMENT (IOC);  Surgeon: Sallee Lange, MD;  Location: [facility] ORS;  Service: Ophthalmology;  Laterality: Right;  [place]  01:22 AP%24.0 CDE 34.56 fluid pack lot # [id number]   DOPPLER ECHOCARDIOGRAPHY  04/1994   MVP with MR (TR)   EYE SURGERY     Holter Monitor     NSR, freq. PVC's, rare colp, freq. PAC's   NSVD     X 2   Rheumatic Fever     (?) as a child. Hosp, prolonged fever for several days.   SKIN CANCER DESTRUCTION  06/2017   Dr. Roseanne Kaufman   TONSILLECTOMY     As a child   TOOTH EXTRACTION  02/2018    MEDICATIONS/ALLERGIES   Current Meds  Medication Sig   alendronate (FOSAMAX) 10 MG tablet Take 1 tablet (10 mg total) by mouth daily before breakfast. Take with a full glass of water on an empty stomach.   atorvastatin (LIPITOR) 10 MG tablet TAKE 1 TABLET DAILY   calcium carbonate (TUMS - DOSED IN MG ELEMENTAL CALCIUM) 500 MG chewable tablet Chew 1 tablet by mouth as needed for indigestion or heartburn.   Cholecalciferol  (VITAMIN D3) 1000 units CAPS Take 2 capsules by mouth daily.    Coenzyme Q10 (COQ10) 200 MG CAPS Take 1 capsule by mouth daily.   Cyanocobalamin (VITAMIN B 12) 500 MCG TABS daily at 6 (six) AM.   hydroxypropyl methylcellulose (ISOPTO TEARS) 2.5 % ophthalmic solution 1 drop 3 (three) times daily as needed.   Multiple Vitamin (MULTIVITAMIN) tablet Take 1 tablet by mouth daily.   naproxen sodium (ALEVE) 220 MG tablet Take 220 mg by mouth daily as needed.   Oxymetazoline HCl (AFRIN NASAL SPRAY NA) Place into the nose.   sodium chloride (OCEAN) 0.65 % nasal spray Place 1 spray into the nose as needed.   SYNTHROID 100 MCG tablet TAKE 1 TABLET DAILY    Allergies  Allergen Reactions   Clindamycin     REACTION: Upset stomach and diarrhea   Metoprolol     Bradycardia at low dose.    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  Constitutional:      General: She is not in acute distress.    Appearance: Normal appearance. She is normal weight. She is not ill-appearing or  toxic-appearing.     Comments: Appears relatively healthy for stated age.  HENT:     Head: Normocephalic and atraumatic.  Neck:     Vascular: No carotid bruit, hepatojugular reflux or JVD.  Cardiovascular:     Rate and Rhythm: Normal rate and regular rhythm. No extrasystoles are present.    Chest Wall: PMI is not displaced.     Pulses: Normal pulses and intact distal pulses. No decreased pulses (Weak but palpable).     Heart sounds: Normal heart sounds, S1 normal and S2 normal. No murmur (No murmurs or clicks) heard.    No friction rub. No gallop.  Pulmonary:     Effort: Pulmonary effort is normal. No respiratory distress.     Breath sounds: Normal breath sounds. No wheezing, rhonchi or rales.  Musculoskeletal:        General: No swelling. Normal range of motion.     Cervical back: Normal range of motion and neck supple.  Skin:    General: Skin is warm and dry.  Neurological:     General: No focal deficit present.     Mental Status: She is alert and oriented to person, place, and time.  Psychiatric:        Mood and Affect: Mood normal.        Behavior: Behavior normal.        Thought Content: Thought content normal.        Judgment: Judgment normal.     Adult ECG Report  Rate: 67 ;  Rhythm: normal sinus rhythm and normal axis, intervals and durations. ;   Narrative Interpretation: Stable  Recent Labs: Reviewed-should be due for labs to be checked by PCP soon Lab Results  Component Value Date   CHOL 167 07/18/2021   HDL 59.10 07/18/2021   LDLCALC 84 07/18/2021   TRIG 116.0 07/18/2021   CHOLHDL 3 07/18/2021   Lab Results  Component Value Date   CREATININE 0.93 07/04/2021  BUN 23 07/04/2021   NA 139 07/04/2021   K 4.6 07/04/2021   CL 101 07/04/2021   CO2 26 07/04/2021      Latest Ref Rng & Units 07/04/2021    1:42 PM 04/04/2010    8:17 AM 03/30/2009    8:13 AM  CBC  WBC 4.0 - 10.5 K/uL 9.3  7.8  7.7   Hemoglobin 12.0 - 15.0 g/dL 16.1  09.6  04.5   Hematocrit  36.0 - 46.0 % 44.2  44.7  44.6   Platelets 150.0 - 400.0 K/uL 220.0  254.0  239.0     No results found for: "HGBA1C" Lab Results  Component Value Date   TSH 0.53 07/04/2021    ================================================== I spent a total of 18 minutes with the patient spent in direct patient consultation.  Additional time spent with chart review  / charting (studies, outside notes, etc): 13 min Total Time: 31 min  Current medicines are reviewed at length with the patient today.  (+/- concerns) none  Notice: This dictation was prepared with Dragon dictation along with smart phrase technology. Any transcriptional errors that result from this process are unintentional and may not be corrected upon review.  Studies Ordered:  Orders Placed This Encounter  Procedures   EKG 12-Lead   No orders of the defined types were placed in this encounter.   Patient Instructions / Medication Changes & Studies & Tests Ordered   Patient Instructions  Medication Instructions:  Your physician recommends that you continue on your current medications as directed. Please refer to the Current Medication list given to you today.  *If you need a refill on your cardiac medications before your next appointment, please call your pharmacy*  Lab Work: -None ordered If you have labs (blood work) drawn today and your tests are completely normal, you will receive your results only by: MyChart Message (if you have MyChart) OR A paper copy in the mail If you have any lab test that is abnormal or we need to change your treatment, we will call you to review the results.  Testing/Procedures: -None ordered  Follow-Up: At [facility], you and your health needs are our priority.  As part of our continuing mission to provide you with exceptional heart care, we have created designated Provider Care Teams.  These Care Teams include your primary Cardiologist (physician) and Advanced Practice Providers  (APPs -  Physician Assistants and Nurse Practitioners) who all work together to provide you with the care you need, when you need it.  Your next appointment:   Follow-up as needed   Provider:   Bryan Lemma, MD    Other Instructions -None      Marykay Lex, MD, MS Bryan Lemma, M.D., M.S. Interventional Cardiologist  [facility]   [address]; [address] [place], [place]  [address] [phone]           Fax [phone]    Thank you for choosing [facility] in [place]!!

## 2022-07-12 ENCOUNTER — Encounter: Payer: Self-pay | Admitting: Cardiology

## 2022-07-12 NOTE — Assessment & Plan Note (Signed)
No exam findings to suggest MR or MVP.  Echocardiogram not show any evidence of MVP and there is only trivial MR.

## 2022-07-12 NOTE — Assessment & Plan Note (Addendum)
She probably does have may be class I hypertension but her blood pressure range at home was pretty solid between 116/64 to 140/73.  I would be very reluctant to be aggressive in treating blood pressure in this elderly woman with no concerning CHF symptoms.Diane Tucker

## 2022-07-12 NOTE — Assessment & Plan Note (Signed)
Well-controlled.  No longer really present.  Did not tolerate beta-blocker in the past.  Would prefer to avoid AV nodal agents at this point.

## 2022-07-14 ENCOUNTER — Other Ambulatory Visit: Payer: Self-pay | Admitting: Family Medicine

## 2022-07-14 DIAGNOSIS — E039 Hypothyroidism, unspecified: Secondary | ICD-10-CM

## 2022-07-14 DIAGNOSIS — E785 Hyperlipidemia, unspecified: Secondary | ICD-10-CM

## 2022-07-14 DIAGNOSIS — M8580 Other specified disorders of bone density and structure, unspecified site: Secondary | ICD-10-CM

## 2022-07-21 ENCOUNTER — Other Ambulatory Visit (INDEPENDENT_AMBULATORY_CARE_PROVIDER_SITE_OTHER): Payer: Medicare Other

## 2022-07-21 DIAGNOSIS — E785 Hyperlipidemia, unspecified: Secondary | ICD-10-CM

## 2022-07-21 DIAGNOSIS — M8580 Other specified disorders of bone density and structure, unspecified site: Secondary | ICD-10-CM | POA: Diagnosis not present

## 2022-07-21 DIAGNOSIS — E039 Hypothyroidism, unspecified: Secondary | ICD-10-CM

## 2022-07-21 LAB — CBC WITH DIFFERENTIAL/PLATELET
Basophils Absolute: 0 10*3/uL (ref 0.0–0.1)
Basophils Relative: 0.6 % (ref 0.0–3.0)
Eosinophils Absolute: 0.2 10*3/uL (ref 0.0–0.7)
Eosinophils Relative: 2.9 % (ref 0.0–5.0)
HCT: 46.3 % — ABNORMAL HIGH (ref 36.0–46.0)
Hemoglobin: 15.1 g/dL — ABNORMAL HIGH (ref 12.0–15.0)
Lymphocytes Relative: 23.7 % (ref 12.0–46.0)
Lymphs Abs: 1.9 10*3/uL (ref 0.7–4.0)
MCHC: 32.7 g/dL (ref 30.0–36.0)
MCV: 93.9 fl (ref 78.0–100.0)
Monocytes Absolute: 0.8 10*3/uL (ref 0.1–1.0)
Monocytes Relative: 9.6 % (ref 3.0–12.0)
Neutro Abs: 5.1 10*3/uL (ref 1.4–7.7)
Neutrophils Relative %: 63.2 % (ref 43.0–77.0)
Platelets: 275 10*3/uL (ref 150.0–400.0)
RBC: 4.92 Mil/uL (ref 3.87–5.11)
RDW: 13.7 % (ref 11.5–15.5)
WBC: 8.1 10*3/uL (ref 4.0–10.5)

## 2022-07-21 LAB — LIPID PANEL
Cholesterol: 163 mg/dL (ref 0–200)
HDL: 58.6 mg/dL (ref >39.00)
LDL Cholesterol: 83 mg/dL (ref 0–99)
NonHDL: 104.57
Total CHOL/HDL Ratio: 3
Triglycerides: 110 mg/dL (ref 0.0–149.0)
VLDL: 22 mg/dL (ref 0.0–40.0)

## 2022-07-21 LAB — COMPREHENSIVE METABOLIC PANEL
ALT: 16 U/L (ref 0–35)
AST: 26 U/L (ref 0–37)
Albumin: 4.2 g/dL (ref 3.5–5.2)
Alkaline Phosphatase: 52 U/L (ref 39–117)
BUN: 18 mg/dL (ref 6–23)
CO2: 27 mEq/L (ref 19–32)
Calcium: 9.3 mg/dL (ref 8.4–10.5)
Chloride: 102 mEq/L (ref 96–112)
Creatinine, Ser: 0.84 mg/dL (ref 0.40–1.20)
GFR: 62.77 mL/min (ref >60.00)
Glucose, Bld: 92 mg/dL (ref 70–99)
Potassium: 4.3 mEq/L (ref 3.5–5.1)
Sodium: 138 mEq/L (ref 135–145)
Total Bilirubin: 0.6 mg/dL (ref 0.2–1.2)
Total Protein: 7.3 g/dL (ref 6.0–8.3)

## 2022-07-21 LAB — TSH: TSH: 0.61 u[IU]/mL (ref 0.35–5.50)

## 2022-07-21 LAB — VITAMIN D 25 HYDROXY (VIT D DEFICIENCY, FRACTURES): VITD: 40.59 ng/mL (ref 30.00–100.00)

## 2022-07-22 ENCOUNTER — Other Ambulatory Visit: Payer: Self-pay | Admitting: Family Medicine

## 2022-07-28 ENCOUNTER — Encounter: Payer: Self-pay | Admitting: Family Medicine

## 2022-07-28 ENCOUNTER — Ambulatory Visit (INDEPENDENT_AMBULATORY_CARE_PROVIDER_SITE_OTHER): Payer: Medicare Other | Admitting: Family Medicine

## 2022-07-28 VITALS — BP 124/76 | HR 70 | Temp 97.9°F | Ht 62.0 in | Wt 150.0 lb

## 2022-07-28 DIAGNOSIS — E038 Other specified hypothyroidism: Secondary | ICD-10-CM | POA: Diagnosis not present

## 2022-07-28 DIAGNOSIS — Z0000 Encounter for general adult medical examination without abnormal findings: Secondary | ICD-10-CM

## 2022-07-28 DIAGNOSIS — M8580 Other specified disorders of bone density and structure, unspecified site: Secondary | ICD-10-CM | POA: Diagnosis not present

## 2022-07-28 DIAGNOSIS — R2689 Other abnormalities of gait and mobility: Secondary | ICD-10-CM | POA: Diagnosis not present

## 2022-07-28 DIAGNOSIS — Z7189 Other specified counseling: Secondary | ICD-10-CM

## 2022-07-28 DIAGNOSIS — E785 Hyperlipidemia, unspecified: Secondary | ICD-10-CM | POA: Diagnosis not present

## 2022-07-28 DIAGNOSIS — R140 Abdominal distension (gaseous): Secondary | ICD-10-CM

## 2022-07-28 NOTE — Patient Instructions (Addendum)
Recheck in 6 months, to recheck memory.  Update me as needed.  Take care.  Glad to see you.  Let me know if you don't get a call about PT.   Try to elevate your feet when possible.   Stop dairy for 1 week and see how you feel.

## 2022-07-28 NOTE — Progress Notes (Unsigned)
I have personally reviewed the Medicare Annual Wellness questionnaire and have noted 1. The patient's medical and social history 2. Their use of alcohol, tobacco or illicit drugs 3. Their current medications and supplements 4. The patient's functional ability including ADL's, fall risks, home safety risks and hearing or visual             impairment. 5. Diet and physical activities 6. Evidence for depression or mood disorders  The patients weight, height, BMI have been recorded in the chart and visual acuity is per eye clinic.  I have made referrals, counseling and provided education to the patient based review of the above and I have provided the pt with a written personalized care plan for preventive services.  Provider list updated- see scanned forms.  Routine anticipatory guidance given to patient.  See health maintenance. The possibility exists that previously documented standard health maintenance information may have been brought forward from a previous encounter into this note.  If needed, that same information has been updated to reflect the current situation based on today's encounter.    Flu Shingles PNA Tetanus Colon  Breast cancer screening Prostate cancer screening Advance directive Cognitive function addressed- see scanned forms- and if abnormal then additional documentation follows.   In addition to [facility], follow up visit for the below conditions:  We opted to observe for memory changes.  Recheck in 6 months.  No red flag events.  Prev B12 not low.    BLE edema.  More in the summer.  Now with tingling in the BLE, shins and distally.  Unclear if this is affecting her balance.    Hypothyroidism.  Compliant.  Taking levothyroxine at night due to fosamax use.  TSH wnl.    Osteopenia, taking fosamax daily.  Started 02/2021.  Compliant.  No ADE on med.  DXA 2022.    Elevated Cholesterol: Using medications without problems: yes Muscle aches: no Diet  compliance: yes Exercise: yes  She has occ abd bloating that improves with Gas X.  Discussed trial off dairy.   PMH and SH reviewed  Meds, vitals, and allergies reviewed.   ROS: Per HPI.  Unless specifically indicated otherwise in HPI, the patient denies:  General: fever. Eyes: acute vision changes ENT: sore throat Cardiovascular: chest pain Respiratory: SOB GI: vomiting GU: dysuria Musculoskeletal: acute back pain Derm: acute rash Neuro: acute motor dysfunction Psych: worsening mood Endocrine: polydipsia Heme: bleeding Allergy: hayfever  GEN: nad, alert and oriented HEENT: mucous membranes moist NECK: supple w/o LA CV: rrr. PULM: ctab, no inc wob ABD: soft, +bs EXT: trace BLE edema SKIN: no acute rash  Dec but not absent monofilament sensation B.

## 2022-07-30 DIAGNOSIS — R140 Abdominal distension (gaseous): Secondary | ICD-10-CM | POA: Insufficient documentation

## 2022-07-30 DIAGNOSIS — R2689 Other abnormalities of gait and mobility: Secondary | ICD-10-CM | POA: Insufficient documentation

## 2022-07-30 NOTE — Assessment & Plan Note (Signed)
Compliant.  Taking levothyroxine at night due to fosamax use.  TSH wnl.   Continue levothyroxine as is.

## 2022-07-30 NOTE — Assessment & Plan Note (Signed)
Flu previously done Shingles discussed with patient PNA previously done Tetanus 2021 COVID-vaccine previously done Colon cancer screening not due given her age. Mammogram 2023 Bone density test 2022. Advance directive-husband designated if patient were incapacitated. Cognitive function addressed- see scanned forms- and if abnormal then additional documentation follows.

## 2022-07-30 NOTE — Assessment & Plan Note (Signed)
Continue atorvastatin

## 2022-07-30 NOTE — Assessment & Plan Note (Addendum)
She has occ abd bloating that improves with Gas X.  Discussed trial off dairy.  No red flag symptoms.  She can update me as needed.

## 2022-07-30 NOTE — Assessment & Plan Note (Signed)
Referral for PT eval/treatment.

## 2022-07-30 NOTE — Assessment & Plan Note (Signed)
Advance directive- husband designated if patient were incapacitated.  

## 2022-07-30 NOTE — Assessment & Plan Note (Signed)
Osteopenia, taking fosamax daily.  Started 02/2021.  Compliant.  No ADE on med.  DXA 2022.   Continue Fosamax as is.  Routine cautions discussed with patient.

## 2022-08-13 NOTE — Therapy (Signed)
OUTPATIENT PHYSICAL THERAPY NEURO EVALUATION   Patient Name: Diane Tucker MRN: [id number] DOB:1934-01-25, 89 y.o., female Today's Date: 08/14/2022   PCP: Joaquim Nam, MD  REFERRING PROVIDER: Joaquim Nam, MD   END OF SESSION:  PT End of Session - 08/14/22 1057     Visit Number 1    Number of Visits 16    Date for PT Re-Evaluation 10/09/22    PT Start Time 1100    PT Stop Time 1145    PT Time Calculation (min) 45 min    Equipment Utilized During Treatment Gait belt    Activity Tolerance Patient tolerated treatment well    Behavior During Therapy WFL for tasks assessed/performed             Past Medical History:  Diagnosis Date   Allergy    Arthritis    Dysrhythmia    Edema    ANKLES   GERD (gastroesophageal reflux disease)    HOH (hard of hearing)    Hyperlipidemia    Hypothyroidism    Migraine aura without headache (migraine equivalents)    Murmur    Neuropathy    FEET   Osteopenia    Thyroid disease    Hypothyroidism   Past Surgical History:  Procedure Laterality Date   CATARACT EXTRACTION W/PHACO Right 08/27/2015   Procedure: CATARACT EXTRACTION PHACO AND INTRAOCULAR LENS PLACEMENT (IOC);  Surgeon: Sallee Lange, MD;  Location: [facility] ORS;  Service: Ophthalmology;  Laterality: Right;  [place]  01:22 AP%24.0 CDE 34.56 fluid pack lot # [id number]   DOPPLER ECHOCARDIOGRAPHY  04/1994   MVP with MR (TR)   EYE SURGERY     Holter Monitor     NSR, freq. PVC's, rare colp, freq. PAC's   NSVD     X 2   Rheumatic Fever     (?) as a child. Hosp, prolonged fever for several days.   SKIN CANCER DESTRUCTION  06/2017   Dr. Roseanne Kaufman   TONSILLECTOMY     As a child   TOOTH EXTRACTION  02/2018   Patient Active Problem List   Diagnosis Date Noted   Balance disorder 07/30/2022   Abdominal bloating 07/30/2022   Abnormal x-ray 06/29/2022   Pelvic floor dysfunction 07/26/2021   Shoulder pain 12/30/2020   Paresthesia 12/30/2020   Advance care planning  07/18/2018   Healthcare maintenance 07/06/2015   Hereditary and idiopathic peripheral neuropathy 07/06/2015   Osteoarthritis of finger 06/29/2014   Vitamin D deficiency 06/29/2014   Caregiver burden 06/26/2013   Medicare annual wellness visit, subsequent 05/26/2012   Back pain 04/22/2011   ALLERGY 08/06/2006   Hypothyroidism 08/05/2006   HLD (hyperlipidemia) 08/05/2006   HEARING LOSS, HIGH FREQUENCY 08/05/2006   Elevated BP without diagnosis of hypertension 08/05/2006   H/O mitral valve prolapse 08/05/2006   Allergic rhinitis 08/05/2006   POSTMENOPAUSAL STATUS 08/05/2006   Osteopenia 08/05/2006   Palpitations 08/05/2006    ONSET DATE: 2 years  REFERRING DIAG: Balance Disorder  THERAPY DIAG:  Other abnormalities of gait and mobility  Muscle weakness (generalized)  Other disturbances of skin sensation  Rationale for Evaluation and Treatment: Rehabilitation  SUBJECTIVE:  SUBJECTIVE STATEMENT: Pt reports several years ago she started out with tingling in feet and toes, but she is not diabetic and reports she does have sensation. She finds her balance if she is walking and turns she finds herself "lurching off to the side, and unsteady". Reports she hasn't fallen recently last 6 months but has in years past. Pt walks in the woods with a walking stick but otherwise no AD. Pt reports independent with ADLs, drive, shopping.  Pt accompanied by: self  PERTINENT HISTORY: Patient is an 89 y.o. F with a PMH of osteopenia, arthritis, dysrhythmia, B ankle edema, GERD, hearing loss, HLD, hypothyroidism, migraine aura w/o HA, and neuropathy of B feet. Pt's surgical hx includes R cataract extraction (2017) and skin CA extracton (2019). Pt wishes to work on balance and receive exercises to help her improve  strength.   PAIN:  Are you having pain? No  PRECAUTIONS: Fall  WEIGHT BEARING RESTRICTIONS: No  FALLS: Has patient fallen in last 6 months?  no  LIVING ENVIRONMENT: Lives with: lives with their spouse Lives in: House/apartment Stairs: Yes: Internal: 12 steps; on left going up and External: 3 steps; can reach both Has following equipment at home: None  PLOF: Independent, Independent with basic ADLs, Leisure: walking in the woods, vacation house on a sound and enjoys kayaking, and enjoys bowling (but doesn't do it anymore), light gardening.   PATIENT GOALS: To improve balance and strength.   OBJECTIVE:    COGNITION: Overall cognitive status: Within functional limits for tasks assessed   SENSATION: Proprioception: Impaired  Big toe R absent   L mildy impaired  COORDINATION: Intact: heel to shin and nose to finger     POSTURE: rounded shoulders, forward head, increased thoracic kyphosis, and weight shift right in sitting, L veer in standing / walking   VISION:  Mildly impaired to L peripheral   LOWER EXTREMITY ROM:     Not formally assessed, although during mobility tasks all LE appear WFL.  LOWER EXTREMITY MMT:    MMT Right Eval Left Eval  Hip flexion 4- 3+  Hip extension 3 3-  Hip abduction 5 5  Hip adduction 4 4  Hip internal rotation    Hip external rotation    Knee flexion 4 4  Knee extension 4+ 4+  Ankle dorsiflexion 4- 4-  Ankle plantarflexion 4- 4-  Ankle inversion    Ankle eversion    (Blank rows = not tested)  BED MOBILITY:  Sit to supine Complete Independence Supine to sit Complete Independence Rolling to Right Complete Independence Rolling to Left Complete Independence  TRANSFERS: Assistive device utilized: None  Sit to stand: Complete Independence Stand to sit: Complete Independence Chair to chair: Complete Independence Floor:  did not assess on eval    STAIRS: Level of Assistance: SBA Stair Negotiation Technique: Alternating  Pattern  with Single Rail on Right Number of Stairs: 4  Height of Stairs: 6"  Comments: Pt demonstrated efficient foot clearance and alt pattern, and stated she must use the handrails.   GAIT: Gait pattern:  significant B over pronation, step through pattern, and lateral lean- Left Distance walked: 1300 Assistive device utilized: None Level of assistance: SBA Comments: mostly independent walking, with mild-mod L veering, pt acknowledges feeling herself drift  FUNCTIONAL TESTS:  5 times sit to stand: 11.82 no UE 6 minute walk test: 1200', mild-mod L veering throughout Functional gait assessment: 17/30  PATIENT SURVEYS:  FOTO 57  TODAY'S TREATMENT:  DATE: 08/14/22     PATIENT EDUCATION: Education details: POC, purpose of therapy, goals  Person educated: Patient Education method: Explanation Education comprehension: verbalized understanding and needs further education  HOME EXERCISE PROGRAM: To be prescribed.   GOALS: Goals reviewed with patient? Yes  SHORT TERM GOALS: Target date: 09/11/22  Patient will be independent in home exercise program to improve   strength/mobility for better functional independence with ADLs.  Baseline: To be prescribed. Goal status: INITIAL     LONG TERM GOALS: Target date: 10/09/2022   Patient will increase FOTO score to equal to or greater than   67  to demonstrate statistically significant improvement in mobility and quality of life.   Baseline: 57 Goal status: INITIAL  2.  Pt will decrease 5TSTS by at least 3 seconds in order to demonstrate clinically significant improvement in LE strength  Baseline: 11.82 sec w/o BUE use  Goal status: INITIAL  3.  Pt will increase by at least 70m (141ft) in order to demonstrate clinically significant improvement in cardiopulmonary endurance and community ambulation  without any demonstration of L veering.   Baseline: 08/14/22: 1200' with mid-mod L veering throughout  Goal status: INITIAL  4.  Pt will increase B hip extension to >/= to 4/5 in order to improve hip extension during gait and mobility tasks. Baseline: 3-/5 Goal status: INITIAL  5.   Pt will increase B ankle DF and PF to 4+/5 Baseline: 4-/5  Goal status: INITIAL   6.  Pt will increase B hip flexor strength to >/= to 4/5 to assist in stair navigation, walking, and all other mobility tasks.  Baseline: R: 4- / L: 3+ Goal status: INITIAL  ASSESSMENT:  CLINICAL IMPRESSION: Patient is a 89 y.o. F who was seen today for physical therapy evaluation and treatment for balance disorder. Patient is a very kind a pleasant individual, and is excited to start her first experience with physical therapy. Patient lives at home with her husband on 9 acres of property, and enjoys walking in the woods and is relatively independent in all ADLs, shopping, and driving. Pt visited her physician for her yearly physical with concerns for her progressive ongoing B foot neuropathy and noticed her balance has declined over the last 1-2 years. During session, SPT noticed pt's biggest areas for improvement are dynamic balancing tasks, gait, strengthening of BLE, core strengthening, and patient education for proper care of feet. SPT noted significant over-pronation of B feet, and feels pt would benefit from referral for orthoses. Patient will benefit from skilled PT to address her strength, balance, mobility, and endurance to improve quality of life and maintain-progress PLOF.   OBJECTIVE IMPAIRMENTS: Abnormal gait, decreased activity tolerance, decreased balance, difficulty walking, decreased ROM, decreased strength, hypomobility, impaired flexibility, impaired sensation, impaired vision/preception, improper body mechanics, and impaired BLE proprioception R > L .   ACTIVITY LIMITATIONS: carrying, lifting, bending, squatting,  sleeping, stairs, reach over head, and locomotion level  PARTICIPATION LIMITATIONS: meal prep, cleaning, laundry, yard work, and walking for exercise   PERSONAL FACTORS: Age, Behavior pattern, Past/current experiences, Sex, Time since onset of injury/illness/exacerbation, and 3+ comorbidities: osteopenia, HLD, arthritis, vision imapairment, and B neuropathy of feet  are also affecting patient's functional outcome.   REHAB POTENTIAL: Good  CLINICAL DECISION MAKING: Evolving/moderate complexity  EVALUATION COMPLEXITY: Moderate  PLAN:  PT FREQUENCY: 2x/week  PT DURATION: 8 weeks  PLANNED INTERVENTIONS: Therapeutic exercises, Therapeutic activity, Neuromuscular re-education, Balance training, Gait training, Patient/Family education, Self Care, Joint mobilization, Stair training,  Vestibular training, Canalith repositioning, Visual/preceptual remediation/compensation, Orthotic/Fit training, Dry Needling, Cryotherapy, Moist heat, Compression bandaging, Splintting, Taping, Manual therapy, and Re-evaluation  PLAN FOR NEXT SESSION: provide HEP for BLE strength, target head turns during ambulation, assess SLS w/o shoes, ambulation outside / uneven surfaces; educate pt on monitoring feet for skin integrity due to neuropathy     Judith Blonder, SPT  This entire session was performed under direct supervision and direction of a licensed Estate agent . I have personally read, edited and approve of the note as written.    Precious Bard, PT 08/14/2022, 12:22 PM

## 2022-08-14 ENCOUNTER — Ambulatory Visit: Payer: Medicare Other | Attending: Family Medicine

## 2022-08-14 DIAGNOSIS — R208 Other disturbances of skin sensation: Secondary | ICD-10-CM | POA: Insufficient documentation

## 2022-08-14 DIAGNOSIS — R2689 Other abnormalities of gait and mobility: Secondary | ICD-10-CM | POA: Diagnosis not present

## 2022-08-14 DIAGNOSIS — M6281 Muscle weakness (generalized): Secondary | ICD-10-CM | POA: Diagnosis not present

## 2022-08-17 NOTE — Progress Notes (Signed)
  Physician Documentation Your signature is required to indicate approval of the treatment plan as stated above. By signing this report, you are approving the plan of care. Please sign and either send electronically or print and fax the signed copy to the number below. If you approve with modifications, please indicate those in the space provided. Physician Signature: Joaquim Nam, MD  Date:__06/30/24  Time:__10:29 AM

## 2022-08-18 ENCOUNTER — Telehealth: Payer: Self-pay | Admitting: *Deleted

## 2022-08-18 NOTE — Telephone Encounter (Signed)
Spoke with [name] and scheduled her with Dr. Patsy Lager 09/11/2022 at 2:00 pm for Orthotics.

## 2022-08-18 NOTE — Therapy (Signed)
OUTPATIENT PHYSICAL THERAPY NEURO EVALUATION   Patient Name: Diane Tucker MRN: [id number] DOB:05-Jan-1934, 89 y.o., female Today's Date: 08/19/2022   PCP: Joaquim Nam, MD  REFERRING PROVIDER: Joaquim Nam, MD   END OF SESSION:  PT End of Session - 08/19/22 1257     Visit Number 2    Number of Visits 16    Date for PT Re-Evaluation 10/09/22    PT Start Time 1300    PT Stop Time 1343    PT Time Calculation (min) 43 min    Equipment Utilized During Treatment Gait belt    Activity Tolerance Patient tolerated treatment well    Behavior During Therapy WFL for tasks assessed/performed              Past Medical History:  Diagnosis Date   Allergy    Arthritis    Dysrhythmia    Edema    ANKLES   GERD (gastroesophageal reflux disease)    HOH (hard of hearing)    Hyperlipidemia    Hypothyroidism    Migraine aura without headache (migraine equivalents)    Murmur    Neuropathy    FEET   Osteopenia    Thyroid disease    Hypothyroidism   Past Surgical History:  Procedure Laterality Date   CATARACT EXTRACTION W/PHACO Right 08/27/2015   Procedure: CATARACT EXTRACTION PHACO AND INTRAOCULAR LENS PLACEMENT (IOC);  Surgeon: Sallee Lange, MD;  Location: [facility] ORS;  Service: Ophthalmology;  Laterality: Right;  [place]  01:22 AP%24.0 CDE 34.56 fluid pack lot # [id number]   DOPPLER ECHOCARDIOGRAPHY  04/1994   MVP with MR (TR)   EYE SURGERY     Holter Monitor     NSR, freq. PVC's, rare colp, freq. PAC's   NSVD     X 2   Rheumatic Fever     (?) as a child. Hosp, prolonged fever for several days.   SKIN CANCER DESTRUCTION  06/2017   Dr. Roseanne Kaufman   TONSILLECTOMY     As a child   TOOTH EXTRACTION  02/2018   Patient Active Problem List   Diagnosis Date Noted   Balance disorder 07/30/2022   Abdominal bloating 07/30/2022   Abnormal x-ray 06/29/2022   Pelvic floor dysfunction 07/26/2021   Shoulder pain 12/30/2020   Paresthesia 12/30/2020   Advance care planning  07/18/2018   Healthcare maintenance 07/06/2015   Hereditary and idiopathic peripheral neuropathy 07/06/2015   Osteoarthritis of finger 06/29/2014   Vitamin D deficiency 06/29/2014   Caregiver burden 06/26/2013   Medicare annual wellness visit, subsequent 05/26/2012   Back pain 04/22/2011   ALLERGY 08/06/2006   Hypothyroidism 08/05/2006   HLD (hyperlipidemia) 08/05/2006   HEARING LOSS, HIGH FREQUENCY 08/05/2006   Elevated BP without diagnosis of hypertension 08/05/2006   H/O mitral valve prolapse 08/05/2006   Allergic rhinitis 08/05/2006   POSTMENOPAUSAL STATUS 08/05/2006   Osteopenia 08/05/2006   Palpitations 08/05/2006    ONSET DATE: 2 years  REFERRING DIAG: Balance Disorder  THERAPY DIAG:  Muscle weakness (generalized)  Other disturbances of skin sensation  Other abnormalities of gait and mobility  Rationale for Evaluation and Treatment: Rehabilitation  SUBJECTIVE: Pt reports everything is going well; pt was called by PCP for appt for orthotics later this month.  SUBJECTIVE STATEMENT: Pt reports several years ago she started out with tingling in feet and toes, but she is not diabetic and reports she does have sensation. She finds her balance if she is walking and turns she finds herself "lurching off to the side, and unsteady". Reports she hasn't fallen recently last 6 months but has in years past. Pt walks in the woods with a walking stick but otherwise no AD. Pt reports independent with ADLs, drive, shopping.  Pt accompanied by: self  PERTINENT HISTORY: Patient is an 89 y.o. F with a PMH of osteopenia, arthritis, dysrhythmia, B ankle edema, GERD, hearing loss, HLD, hypothyroidism, migraine aura w/o HA, and neuropathy of B feet. Pt's surgical hx includes R cataract extraction (2017) and skin  CA extracton (2019). Pt wishes to work on balance and receive exercises to help her improve strength.   PAIN:  Are you having pain? No  PRECAUTIONS: Fall  WEIGHT BEARING RESTRICTIONS: No  FALLS: Has patient fallen in last 6 months?  no  LIVING ENVIRONMENT: Lives with: lives with their spouse Lives in: House/apartment Stairs: Yes: Internal: 12 steps; on left going up and External: 3 steps; can reach both Has following equipment at home: None  PLOF: Independent, Independent with basic ADLs, Leisure: walking in the woods, vacation house on a sound and enjoys kayaking, and enjoys bowling (but doesn't do it anymore), light gardening.   PATIENT GOALS: To improve balance and strength.   OBJECTIVE:    COGNITION: Overall cognitive status: Within functional limits for tasks assessed   SENSATION: Proprioception: Impaired  Big toe R absent   L mildy impaired  COORDINATION: Intact: heel to shin and nose to finger     POSTURE: rounded shoulders, forward head, increased thoracic kyphosis, and weight shift right in sitting, L veer in standing / walking   VISION:  Mildly impaired to L peripheral   LOWER EXTREMITY ROM:     Not formally assessed, although during mobility tasks all LE appear WFL.  LOWER EXTREMITY MMT:    MMT Right Eval Left Eval  Hip flexion 4- 3+  Hip extension 3 3-  Hip abduction 5 5  Hip adduction 4 4  Hip internal rotation    Hip external rotation    Knee flexion 4 4  Knee extension 4+ 4+  Ankle dorsiflexion 4- 4-  Ankle plantarflexion 4- 4-  Ankle inversion    Ankle eversion    (Blank rows = not tested)  BED MOBILITY:  Sit to supine Complete Independence Supine to sit Complete Independence Rolling to Right Complete Independence Rolling to Left Complete Independence  TRANSFERS: Assistive device utilized: None  Sit to stand: Complete Independence Stand to sit: Complete Independence Chair to chair: Complete Independence Floor:  did not assess  on eval    STAIRS: Level of Assistance: SBA Stair Negotiation Technique: Alternating Pattern  with Single Rail on Right Number of Stairs: 4  Height of Stairs: 6"  Comments: Pt demonstrated efficient foot clearance and alt pattern, and stated she must use the handrails.   GAIT: Gait pattern:  significant B over pronation, step through pattern, and lateral lean- Left Distance walked: 1300 Assistive device utilized: None Level of assistance: SBA Comments: mostly independent walking, with mild-mod L veering, pt acknowledges feeling herself drift  FUNCTIONAL TESTS:  5 times sit to stand: 11.82 no UE 6 minute walk test: 1200', mild-mod L veering throughout Functional gait assessment: 17/30  PATIENT SURVEYS:  FOTO 57  TODAY'S TREATMENT:  DATE: 08/19/22   Unless otherwise stated, CGA was provided and gait belt donned in order to ensure pt safety   TherEx: (10 min) -1 x:  SLS balance without shoes for assessment of balance and foot mobility  R: 4 sec  L: 8 sec with sig L lateral lean *add goals  Initial: 15 R toe raises, 12 L  -SL plantar flexion with BUE support, barefoot for assessment of strength and foot mobility   -2 x 15 B: foot doming for arch strength  -2 x 10 x 3 sec concentric hold heel rasies with BUE support  pt education re: foot integrity and monitoring due skin integrity w/ having neuropathy; unilateral heel raises with emphasis on big toe, foot doming -1 x 50' amb immediately after manual therapy to assess if pt's feet feel different; pt states she is more aware of her ankles   NMRE: (8 min) - 5 x 30 sec: eyes closed on Airex pad with BUE progressed to hovering hands over bar after 3rd round; noticed increase A/P sway at ankle with pt's fatigue increasing; pt reported difficult    Manual Therapy: (20 min) -Fascial manipulation x 10 minutes to  B foot flexors and extensors mm with noted resting tension at R medial and transverse arch, R 4th-5th digit flexor; L extensors and medial arch, B retinaculum in sitting position. Pt reported highest of 6/10 pain with manual pressure, which relieved after bouts of tissue mobilization in multi-directional pattern. Will benefit from additional release of retinaculum and ankle mm.  -ant/post mob of great toe on B grade 3/4 for increased ROM. Pt reported pain with L great toe extension which decreased after bouts of mobilization of joint.    PATIENT EDUCATION: Education details: POC, purpose of therapy, goals  Person educated: Patient Education method: Explanation Education comprehension: verbalized understanding and needs further education  HOME EXERCISE PROGRAM: Access Code: [email or web address] URL: [email or web address] Date: 08/19/2022 Prepared by: Precious Bard Exercises - Single Leg Heel Raise with Counter Support  - 2 x daily - 7 x weekly - 2 sets - 10 reps - 5 hold - Arch Lifting  - 2 x daily - 7 x weekly - 2 sets - 10 reps - 5 hold - Seated Ankle Pumps  - 2 x daily - 7 x weekly - 2 sets - 10 reps - 5 hold    GOALS: Goals reviewed with patient? Yes  SHORT TERM GOALS: Target date: 09/11/22  Patient will be independent in home exercise program to improve   strength/mobility for better functional independence with ADLs.  Baseline: To be prescribed. Goal status: INITIAL     LONG TERM GOALS: Target date: 10/09/2022   Patient will increase FOTO score to equal to or greater than   67  to demonstrate statistically significant improvement in mobility and quality of life.   Baseline: 57 Goal status: INITIAL  2.  Pt will decrease 5TSTS by at least 3 seconds in order to demonstrate clinically significant improvement in LE strength  Baseline: 11.82 sec w/o BUE use  Goal status: INITIAL  3.  Pt will increase by at least 27m (149ft) in order to demonstrate clinically significant  improvement in cardiopulmonary endurance and community ambulation without any demonstration of L veering.   Baseline: 08/14/22: 1200' with mid-mod L veering throughout  Goal status: INITIAL  4.  Pt will increase B hip extension to >/= to 4/5 in order to improve hip extension during gait and mobility tasks. Baseline: 3-/5 Goal status: INITIAL  5.   Pt will increase B ankle DF and PF to 4+/5 Baseline: 4-/5  Goal status: INITIAL   6.  Pt will increase B hip flexor strength to >/= to 4/5 to assist in stair navigation, walking, and all other mobility tasks.  Baseline: R: 4- / L: 3+ Goal status: INITIAL  7. Pt will increase B SLS to >10 sec with no UE support to indicate decrease risk of losing balance.   Baseline: 08/19/2022: R: 4 sec, L: 8 sec  Goal status: INITIAL  ASSESSMENT:  CLINICAL IMPRESSION: Pt presents to skilled PT motivated to participate in therapy. SPT assessed foot and ankle mobility and assessed limited great toe extension with pain L > R, as well as stiffness along all arches, especially transverse arch B; retinaculum was stiff and painful, which improved after bouts of fascial manipulation. Pt's SLS and SL plantar flexion was assessed (see above) and goal was added for balance.  Plans to progress balance training and intrinsic/extrinsic foot mm strengthening as well as gait activities. HEP was provided today. Patient will benefit from skilled PT to address her strength, balance, mobility, and endurance to improve quality of life and maintain-progress PLOF.   OBJECTIVE IMPAIRMENTS: Abnormal gait, decreased activity tolerance, decreased balance, difficulty walking, decreased ROM, decreased strength, hypomobility, impaired flexibility, impaired sensation, impaired vision/preception, improper body mechanics, and impaired BLE proprioception R > L .   ACTIVITY LIMITATIONS: carrying, lifting, bending, squatting, sleeping, stairs, reach over head, and locomotion level  PARTICIPATION  LIMITATIONS: meal prep, cleaning, laundry, yard work, and walking for exercise   PERSONAL FACTORS: Age, Behavior pattern, Past/current experiences, Sex, Time since onset of injury/illness/exacerbation, and 3+ comorbidities: osteopenia, HLD, arthritis, vision imapairment, and B neuropathy of feet  are also affecting patient's functional outcome.   REHAB POTENTIAL: Good  CLINICAL DECISION MAKING: Evolving/moderate complexity  EVALUATION COMPLEXITY: Moderate  PLAN:  PT FREQUENCY: 2x/week  PT DURATION: 8 weeks  PLANNED INTERVENTIONS: Therapeutic exercises, Therapeutic activity, Neuromuscular re-education, Balance training, Gait training, Patient/Family education, Self Care, Joint mobilization, Stair training, Vestibular training, Canalith repositioning, Visual/preceptual remediation/compensation, Orthotic/Fit training, Dry Needling, Cryotherapy, Moist heat, Compression bandaging, Splintting, Taping, Manual therapy, and Re-evaluation  PLAN FOR NEXT SESSION: target head turns during ambulation, progress intrinsic/extrinsic foot strength; ambulation outside / uneven surfaces; follow up about HEP    Judith Blonder, SPT  This entire session was performed under direct supervision and direction of a licensed Estate agent . I have personally read, edited and approve of the note as written.    Precious Bard, PT 08/19/2022, 3:29 PM

## 2022-08-18 NOTE — Telephone Encounter (Signed)
-----   Message from Hannah Beat, MD sent at 08/18/2022  8:23 AM EDT ----- Regarding: RE: needs referral [name],   Can you set her up a 40 minute orthotics appointment - my preference is a 2 PM appointment.   ----- Message ----- From: Joaquim Nam, MD Sent: 08/17/2022   6:56 PM EDT To: Damita Lack, CMA; Hannah Beat, MD Subject: FW: needs referral                             See below.  Can you see this patient about orthotics?  If not, please let me know.  Thanks.   Diane Tucker  ----- Message ----- From: Joaquim Nam, MD Sent: 08/14/2022  12:26 PM EDT To: Joaquim Nam, MD Subject: needs referral                                 Hello, PT here. Thank you for this wonderful referral. Upon evaluating this patient we found that she needs orthotics. Do you mind referring her to an orthotist for foot orthotics for her overpronation and hallux valgus. Thank you so very much!

## 2022-08-19 ENCOUNTER — Ambulatory Visit: Payer: Medicare Other | Attending: Family Medicine

## 2022-08-19 DIAGNOSIS — R2689 Other abnormalities of gait and mobility: Secondary | ICD-10-CM | POA: Insufficient documentation

## 2022-08-19 DIAGNOSIS — R208 Other disturbances of skin sensation: Secondary | ICD-10-CM | POA: Insufficient documentation

## 2022-08-19 DIAGNOSIS — M6281 Muscle weakness (generalized): Secondary | ICD-10-CM | POA: Diagnosis not present

## 2022-08-19 DIAGNOSIS — R262 Difficulty in walking, not elsewhere classified: Secondary | ICD-10-CM | POA: Insufficient documentation

## 2022-08-19 DIAGNOSIS — R2681 Unsteadiness on feet: Secondary | ICD-10-CM | POA: Insufficient documentation

## 2022-08-25 NOTE — Therapy (Signed)
OUTPATIENT PHYSICAL THERAPY NEURO TREATMENT   Patient Name: Diane Tucker MRN: [id number] DOB:01/04/1934, 89 y.o., female Today's Date: 08/26/2022   PCP: Joaquim Nam, MD  REFERRING PROVIDER: Joaquim Nam, MD   END OF SESSION:  PT End of Session - 08/26/22 1134     Visit Number 3    Number of Visits 16    Date for PT Re-Evaluation 10/09/22    PT Start Time 1143    PT Stop Time 1228    PT Time Calculation (min) 45 min    Equipment Utilized During Treatment Gait belt    Activity Tolerance Patient tolerated treatment well    Behavior During Therapy WFL for tasks assessed/performed               Past Medical History:  Diagnosis Date   Allergy    Arthritis    Dysrhythmia    Edema    ANKLES   GERD (gastroesophageal reflux disease)    HOH (hard of hearing)    Hyperlipidemia    Hypothyroidism    Migraine aura without headache (migraine equivalents)    Murmur    Neuropathy    FEET   Osteopenia    Thyroid disease    Hypothyroidism   Past Surgical History:  Procedure Laterality Date   CATARACT EXTRACTION W/PHACO Right 08/27/2015   Procedure: CATARACT EXTRACTION PHACO AND INTRAOCULAR LENS PLACEMENT (IOC);  Surgeon: Sallee Lange, MD;  Location: [facility] ORS;  Service: Ophthalmology;  Laterality: Right;  [place]  01:22 AP%24.0 CDE 34.56 fluid pack lot # [id number]   DOPPLER ECHOCARDIOGRAPHY  04/1994   MVP with MR (TR)   EYE SURGERY     Holter Monitor     NSR, freq. PVC's, rare colp, freq. PAC's   NSVD     X 2   Rheumatic Fever     (?) as a child. Hosp, prolonged fever for several days.   SKIN CANCER DESTRUCTION  06/2017   Dr. Roseanne Kaufman   TONSILLECTOMY     As a child   TOOTH EXTRACTION  02/2018   Patient Active Problem List   Diagnosis Date Noted   Balance disorder 07/30/2022   Abdominal bloating 07/30/2022   Abnormal x-ray 06/29/2022   Pelvic floor dysfunction 07/26/2021   Shoulder pain 12/30/2020   Paresthesia 12/30/2020   Advance care planning  07/18/2018   Healthcare maintenance 07/06/2015   Hereditary and idiopathic peripheral neuropathy 07/06/2015   Osteoarthritis of finger 06/29/2014   Vitamin D deficiency 06/29/2014   Caregiver burden 06/26/2013   Medicare annual wellness visit, subsequent 05/26/2012   Back pain 04/22/2011   ALLERGY 08/06/2006   Hypothyroidism 08/05/2006   HLD (hyperlipidemia) 08/05/2006   HEARING LOSS, HIGH FREQUENCY 08/05/2006   Elevated BP without diagnosis of hypertension 08/05/2006   H/O mitral valve prolapse 08/05/2006   Allergic rhinitis 08/05/2006   POSTMENOPAUSAL STATUS 08/05/2006   Osteopenia 08/05/2006   Palpitations 08/05/2006    ONSET DATE: 2 years  REFERRING DIAG: Balance Disorder  THERAPY DIAG:  Muscle weakness (generalized)  Other disturbances of skin sensation  Other abnormalities of gait and mobility  Rationale for Evaluation and Treatment: Rehabilitation  SUBJECTIVE: Pt reports she has been doing her HEP.  SUBJECTIVE STATEMENT: Pt reports several years ago she started out with tingling in feet and toes, but she is not diabetic and reports she does have sensation. She finds her balance if she is walking and turns she finds herself "lurching off to the side, and unsteady". Reports she hasn't fallen recently last 6 months but has in years past. Pt walks in the woods with a walking stick but otherwise no AD. Pt reports independent with ADLs, drive, shopping.  Pt accompanied by: self  PERTINENT HISTORY: Patient is an 89 y.o. F with a PMH of osteopenia, arthritis, dysrhythmia, B ankle edema, GERD, hearing loss, HLD, hypothyroidism, migraine aura w/o HA, and neuropathy of B feet. Pt's surgical hx includes R cataract extraction (2017) and skin CA extracton (2019). Pt wishes to work on balance and  receive exercises to help her improve strength.   PAIN:  Are you having pain? No  PRECAUTIONS: Fall  WEIGHT BEARING RESTRICTIONS: No  FALLS: Has patient fallen in last 6 months?  no  LIVING ENVIRONMENT: Lives with: lives with their spouse Lives in: House/apartment Stairs: Yes: Internal: 12 steps; on left going up and External: 3 steps; can reach both Has following equipment at home: None  PLOF: Independent, Independent with basic ADLs, Leisure: walking in the woods, vacation house on a sound and enjoys kayaking, and enjoys bowling (but doesn't do it anymore), light gardening.   PATIENT GOALS: To improve balance and strength.   OBJECTIVE:    COGNITION: Overall cognitive status: Within functional limits for tasks assessed   SENSATION: Proprioception: Impaired  Big toe R absent   L mildy impaired  COORDINATION: Intact: heel to shin and nose to finger     POSTURE: rounded shoulders, forward head, increased thoracic kyphosis, and weight shift right in sitting, L veer in standing / walking   VISION:  Mildly impaired to L peripheral   LOWER EXTREMITY ROM:     Not formally assessed, although during mobility tasks all LE appear WFL.  LOWER EXTREMITY MMT:    MMT Right Eval Left Eval  Hip flexion 4- 3+  Hip extension 3 3-  Hip abduction 5 5  Hip adduction 4 4  Hip internal rotation    Hip external rotation    Knee flexion 4 4  Knee extension 4+ 4+  Ankle dorsiflexion 4- 4-  Ankle plantarflexion 4- 4-  Ankle inversion    Ankle eversion    (Blank rows = not tested)  BED MOBILITY:  Sit to supine Complete Independence Supine to sit Complete Independence Rolling to Right Complete Independence Rolling to Left Complete Independence  TRANSFERS: Assistive device utilized: None  Sit to stand: Complete Independence Stand to sit: Complete Independence Chair to chair: Complete Independence Floor:  did not assess on eval    STAIRS: Level of Assistance: SBA Stair  Negotiation Technique: Alternating Pattern  with Single Rail on Right Number of Stairs: 4  Height of Stairs: 6"  Comments: Pt demonstrated efficient foot clearance and alt pattern, and stated she must use the handrails.   GAIT: Gait pattern:  significant B over pronation, step through pattern, and lateral lean- Left Distance walked: 1300 Assistive device utilized: None Level of assistance: SBA Comments: mostly independent walking, with mild-mod L veering, pt acknowledges feeling herself drift  FUNCTIONAL TESTS:  5 times sit to stand: 11.82 no UE 6 minute walk test: 1200', mild-mod L veering throughout Functional gait assessment: 17/30  PATIENT SURVEYS:  FOTO 57  TODAY'S TREATMENT:  DATE: 08/26/22   Unless otherwise stated, CGA was provided and gait belt donned in order to ensure pt safety   TherEx: (15 min)  -2 x 15 B: foot doming for arch strength  -2 x 10 x 3 sec concentric hold heel rasies with BUE support; ~1" higher heel raise on L than R, pt reports L side feels "stiffer". -3 x 10 hip extension w/ BUE support for glute strength; added 2.5 AW; added slider disc on floor during second trial  -2 x 15 glute bridges -soleus / gastsroc strech 2 x 60 B .5     NMRE: (23 min) -ambulation with head turns in hallway, noted increased L veering and cued to recenter  -ambulation w/ one walking stick, SPT paced the pt to speed walk, pt reported medium  -walking on uneven surface with red mat, pt reported hard without walking stick, but medium with  -obstacle course with: red mat, cones, hedge hogs, blue airex pad, blue curb mat  -side ways stepping with blue curb mat x 2, one LOB anteriorly, pt reports "I feel I do better when I look straight forward instead of down at my feet"  -standing on airex at whiteboard, no UE support, cued to engage core, after second  trial, pt's swaying reduced -standing on Airex with dual task of spelling alphabet with magnets    PATIENT EDUCATION: Education details: POC, purpose of therapy, goals  Person educated: Patient Education method: Explanation Education comprehension: verbalized understanding and needs further education  HOME EXERCISE PROGRAM: Access Code: [email or web address] URL: [email or web address] Date: 08/19/2022 Prepared by: Precious Bard Exercises - Single Leg Heel Raise with Counter Support  - 2 x daily - 7 x weekly - 2 sets - 10 reps - 5 hold - Arch Lifting  - 2 x daily - 7 x weekly - 2 sets - 10 reps - 5 hold - Seated Ankle Pumps  - 2 x daily - 7 x weekly - 2 sets - 10 reps - 5 hold   Access Code: [email or web address] URL: [email or web address] Date: 08/26/2022 Prepared by: Precious Bard Exercises - Seated Soleus Stretch with Strap  - 1 x daily - 7 x weekly - 2 sets - 2 reps - 60 hold - Seated Calf Stretch with Strap  - 1 x daily - 7 x weekly - 2 sets - 2 reps - 60 hold   GOALS: Goals reviewed with patient? Yes  SHORT TERM GOALS: Target date: 09/11/22  Patient will be independent in home exercise program to improve   strength/mobility for better functional independence with ADLs.  Baseline: To be prescribed. Goal status: INITIAL    LONG TERM GOALS: Target date: 10/09/2022   Patient will increase FOTO score to equal to or greater than   67  to demonstrate statistically significant improvement in mobility and quality of life.   Baseline: 57 Goal status: INITIAL  2.  Pt will decrease 5TSTS by at least 3 seconds in order to demonstrate clinically significant improvement in LE strength  Baseline: 11.82 sec w/o BUE use  Goal status: INITIAL  3.  Pt will increase by at least 64m (166ft) in order to demonstrate clinically significant improvement in cardiopulmonary endurance and community ambulation without any demonstration of L veering.   Baseline: 08/14/22: 1200' with mid-mod L veering  throughout  Goal status: INITIAL  4.  Pt will increase B hip extension to >/= to 4/5 in order to improve hip extension during gait and mobility tasks. Baseline: 3-/5 Goal status: INITIAL  5.   Pt will increase B ankle DF and PF to 4+/5 Baseline: 4-/5  Goal status: INITIAL   6.  Pt will increase B hip flexor strength to >/= to 4/5 to assist in stair navigation, walking, and all other mobility tasks.  Baseline: R: 4- / L: 3+ Goal status: INITIAL  7. Pt will increase B SLS to >10 sec with no UE support to indicate decrease risk of losing balance.   Baseline: 08/19/2022: R: 4 sec, L: 8 sec  Goal status: INITIAL  ASSESSMENT:  CLINICAL IMPRESSION: Pt presents to skilled PT motivated to participate in therapy. Pt reports her HEP has been going well, but does have some soreness in her tib anterior and calves. Pt was given stretches for both and added to HEP. Uneven ambulation was introduced today, and pt did well to obstacle navigation but does struggle with sideways ambulation on blue foam curb. Plans to continue progression of BLE strength and balance. Patient will benefit from skilled PT to address her strength, balance, mobility, and endurance to improve quality of life and maintain-progress PLOF.   OBJECTIVE IMPAIRMENTS: Abnormal gait, decreased activity tolerance, decreased balance, difficulty walking, decreased ROM, decreased strength, hypomobility, impaired flexibility, impaired sensation, impaired vision/preception, improper body mechanics, and impaired BLE proprioception R > L .   ACTIVITY LIMITATIONS: carrying, lifting, bending, squatting, sleeping, stairs, reach over head, and locomotion level  PARTICIPATION LIMITATIONS: meal prep, cleaning, laundry, yard work, and walking for exercise   PERSONAL FACTORS: Age, Behavior pattern, Past/current experiences, Sex, Time since onset of injury/illness/exacerbation, and 3+ comorbidities: osteopenia, HLD, arthritis, vision imapairment, and B  neuropathy of feet  are also affecting patient's functional outcome.   REHAB POTENTIAL: Good  CLINICAL DECISION MAKING: Evolving/moderate complexity  EVALUATION COMPLEXITY: Moderate  PLAN:  PT FREQUENCY: 2x/week  PT DURATION: 8 weeks  PLANNED INTERVENTIONS: Therapeutic exercises, Therapeutic activity, Neuromuscular re-education, Balance training, Gait training, Patient/Family education, Self Care, Joint mobilization, Stair training, Vestibular training, Canalith repositioning, Visual/preceptual remediation/compensation, Orthotic/Fit training, Dry Needling, Cryotherapy, Moist heat, Compression bandaging, Splintting, Taping, Manual therapy, and Re-evaluation  PLAN FOR NEXT SESSION: target head turns during ambulation, progress intrinsic/extrinsic foot strength; ambulation outside / uneven surfaces; follow up about HEP, strength and balance    Judith Blonder, SPT  This entire session was performed under direct supervision and direction of a licensed Estate agent . I have personally read, edited and approve of the note as written.    Precious Bard, PT 08/26/2022, 2:09 PM

## 2022-08-26 ENCOUNTER — Ambulatory Visit: Payer: Medicare Other

## 2022-08-26 DIAGNOSIS — M6281 Muscle weakness (generalized): Secondary | ICD-10-CM

## 2022-08-26 DIAGNOSIS — R208 Other disturbances of skin sensation: Secondary | ICD-10-CM | POA: Diagnosis not present

## 2022-08-26 DIAGNOSIS — R262 Difficulty in walking, not elsewhere classified: Secondary | ICD-10-CM | POA: Diagnosis not present

## 2022-08-26 DIAGNOSIS — R2689 Other abnormalities of gait and mobility: Secondary | ICD-10-CM | POA: Diagnosis not present

## 2022-08-26 DIAGNOSIS — R2681 Unsteadiness on feet: Secondary | ICD-10-CM | POA: Diagnosis not present

## 2022-08-27 NOTE — Therapy (Signed)
OUTPATIENT PHYSICAL THERAPY NEURO TREATMENT   Patient Name: Diane Tucker MRN: [id number] DOB:04-Nov-1933, 89 y.o., female Today's Date: 08/28/2022   PCP: Joaquim Nam, MD  REFERRING PROVIDER: Joaquim Nam, MD   END OF SESSION:  PT End of Session - 08/28/22 1301     Visit Number 4    Number of Visits 16    Date for PT Re-Evaluation 10/09/22    PT Start Time 1315    PT Stop Time 1359    PT Time Calculation (min) 44 min    Equipment Utilized During Treatment Gait belt    Activity Tolerance Patient tolerated treatment well    Behavior During Therapy WFL for tasks assessed/performed                Past Medical History:  Diagnosis Date   Allergy    Arthritis    Dysrhythmia    Edema    ANKLES   GERD (gastroesophageal reflux disease)    HOH (hard of hearing)    Hyperlipidemia    Hypothyroidism    Migraine aura without headache (migraine equivalents)    Murmur    Neuropathy    FEET   Osteopenia    Thyroid disease    Hypothyroidism   Past Surgical History:  Procedure Laterality Date   CATARACT EXTRACTION W/PHACO Right 08/27/2015   Procedure: CATARACT EXTRACTION PHACO AND INTRAOCULAR LENS PLACEMENT (IOC);  Surgeon: Sallee Lange, MD;  Location: [facility] ORS;  Service: Ophthalmology;  Laterality: Right;  [place]  01:22 AP%24.0 CDE 34.56 fluid pack lot # [id number]   DOPPLER ECHOCARDIOGRAPHY  04/1994   MVP with MR (TR)   EYE SURGERY     Holter Monitor     NSR, freq. PVC's, rare colp, freq. PAC's   NSVD     X 2   Rheumatic Fever     (?) as a child. Hosp, prolonged fever for several days.   SKIN CANCER DESTRUCTION  06/2017   Dr. Roseanne Kaufman   TONSILLECTOMY     As a child   TOOTH EXTRACTION  02/2018   Patient Active Problem List   Diagnosis Date Noted   Balance disorder 07/30/2022   Abdominal bloating 07/30/2022   Abnormal x-ray 06/29/2022   Pelvic floor dysfunction 07/26/2021   Shoulder pain 12/30/2020   Paresthesia 12/30/2020   Advance care  planning 07/18/2018   Healthcare maintenance 07/06/2015   Hereditary and idiopathic peripheral neuropathy 07/06/2015   Osteoarthritis of finger 06/29/2014   Vitamin D deficiency 06/29/2014   Caregiver burden 06/26/2013   Medicare annual wellness visit, subsequent 05/26/2012   Back pain 04/22/2011   ALLERGY 08/06/2006   Hypothyroidism 08/05/2006   HLD (hyperlipidemia) 08/05/2006   HEARING LOSS, HIGH FREQUENCY 08/05/2006   Elevated BP without diagnosis of hypertension 08/05/2006   H/O mitral valve prolapse 08/05/2006   Allergic rhinitis 08/05/2006   POSTMENOPAUSAL STATUS 08/05/2006   Osteopenia 08/05/2006   Palpitations 08/05/2006    ONSET DATE: 2 years  REFERRING DIAG: Balance Disorder  THERAPY DIAG:  Muscle weakness (generalized)  Other disturbances of skin sensation  Other abnormalities of gait and mobility  Rationale for Evaluation and Treatment: Rehabilitation  SUBJECTIVE: No aches or pains, reports feeling good after last session.  SUBJECTIVE STATEMENT: Pt reports several years ago she started out with tingling in feet and toes, but she is not diabetic and reports she does have sensation. She finds her balance if she is walking and turns she finds herself "lurching off to the side, and unsteady". Reports she hasn't fallen recently last 6 months but has in years past. Pt walks in the woods with a walking stick but otherwise no AD. Pt reports independent with ADLs, drive, shopping.  Pt accompanied by: self  PERTINENT HISTORY: Patient is an 89 y.o. F with a PMH of osteopenia, arthritis, dysrhythmia, B ankle edema, GERD, hearing loss, HLD, hypothyroidism, migraine aura w/o HA, and neuropathy of B feet. Pt's surgical hx includes R cataract extraction (2017) and skin CA extracton (2019). Pt  wishes to work on balance and receive exercises to help her improve strength.   PAIN:  Are you having pain? No  PRECAUTIONS: Fall  WEIGHT BEARING RESTRICTIONS: No  FALLS: Has patient fallen in last 6 months?  no  LIVING ENVIRONMENT: Lives with: lives with their spouse Lives in: House/apartment Stairs: Yes: Internal: 12 steps; on left going up and External: 3 steps; can reach both Has following equipment at home: None  PLOF: Independent, Independent with basic ADLs, Leisure: walking in the woods, vacation house on a sound and enjoys kayaking, and enjoys bowling (but doesn't do it anymore), light gardening.   PATIENT GOALS: To improve balance and strength.   OBJECTIVE:    COGNITION: Overall cognitive status: Within functional limits for tasks assessed   SENSATION: Proprioception: Impaired  Big toe R absent   L mildy impaired  COORDINATION: Intact: heel to shin and nose to finger     POSTURE: rounded shoulders, forward head, increased thoracic kyphosis, and weight shift right in sitting, L veer in standing / walking   VISION:  Mildly impaired to L peripheral   LOWER EXTREMITY ROM:     Not formally assessed, although during mobility tasks all LE appear WFL.  LOWER EXTREMITY MMT:    MMT Right Eval Left Eval  Hip flexion 4- 3+  Hip extension 3 3-  Hip abduction 5 5  Hip adduction 4 4  Hip internal rotation    Hip external rotation    Knee flexion 4 4  Knee extension 4+ 4+  Ankle dorsiflexion 4- 4-  Ankle plantarflexion 4- 4-  Ankle inversion    Ankle eversion    (Blank rows = not tested)  BED MOBILITY:  Sit to supine Complete Independence Supine to sit Complete Independence Rolling to Right Complete Independence Rolling to Left Complete Independence  TRANSFERS: Assistive device utilized: None  Sit to stand: Complete Independence Stand to sit: Complete Independence Chair to chair: Complete Independence Floor:  did not assess on eval     STAIRS: Level of Assistance: SBA Stair Negotiation Technique: Alternating Pattern  with Single Rail on Right Number of Stairs: 4  Height of Stairs: 6"  Comments: Pt demonstrated efficient foot clearance and alt pattern, and stated she must use the handrails.   GAIT: Gait pattern:  significant B over pronation, step through pattern, and lateral lean- Left Distance walked: 1300 Assistive device utilized: None Level of assistance: SBA Comments: mostly independent walking, with mild-mod L veering, pt acknowledges feeling herself drift  FUNCTIONAL TESTS:  5 times sit to stand: 11.82 no UE 6 minute walk test: 1200', mild-mod L veering throughout Functional gait assessment: 17/30  PATIENT SURVEYS:  FOTO 57  TODAY'S TREATMENT:  DATE: 08/28/22   Unless otherwise stated, CGA was provided and gait belt donned in order to ensure pt safety   TherEx:  Sit to stand 10x  Seated alphabet 1x each LE  GTB Row 15x  Seated df/pf 20x  Dynadisc: clockwise circles 10x each ankle   NMRE:  Speed ladder: 4x length each of the below: cues for sequencing and coordination -one foot in one foot out -lateral step in/out -diagonal step in out  ambulate in hallway: occasional scissor stepping with head turns  -horizontal head turns with cues for reading alphabet from cards 86 ftx 2 sets -horizontal head turns with cues for reading number and symbols from cards 86 ft x 2 sets  -"red light/green light" for sudden initiation/termination of ambulation with close CGA for carryover to natural environment 2x 86 ft    -standing on airex at whiteboard, no UE support, cued to engage core, after second trial, pt's swaying reduced -standing on Airex with dual task of spelling alphabet with magnets   Half foam roller:  -df/pf 15x  Airex pad tandem stance with dynadisc 30 seconds each  LE; x 2 sets    PATIENT EDUCATION: Education details: POC, purpose of therapy, goals  Person educated: Patient Education method: Explanation Education comprehension: verbalized understanding and needs further education  HOME EXERCISE PROGRAM: Access Code: [email or web address] URL: [email or web address] Date: 08/19/2022 Prepared by: Precious Bard Exercises - Single Leg Heel Raise with Counter Support  - 2 x daily - 7 x weekly - 2 sets - 10 reps - 5 hold - Arch Lifting  - 2 x daily - 7 x weekly - 2 sets - 10 reps - 5 hold - Seated Ankle Pumps  - 2 x daily - 7 x weekly - 2 sets - 10 reps - 5 hold   Access Code: [email or web address] URL: [email or web address] Date: 08/26/2022 Prepared by: Precious Bard Exercises - Seated Soleus Stretch with Strap  - 1 x daily - 7 x weekly - 2 sets - 2 reps - 60 hold - Seated Calf Stretch with Strap  - 1 x daily - 7 x weekly - 2 sets - 2 reps - 60 hold   GOALS: Goals reviewed with patient? Yes  SHORT TERM GOALS: Target date: 09/11/22  Patient will be independent in home exercise program to improve   strength/mobility for better functional independence with ADLs.  Baseline: To be prescribed. Goal status: INITIAL    LONG TERM GOALS: Target date: 10/09/2022   Patient will increase FOTO score to equal to or greater than   67  to demonstrate statistically significant improvement in mobility and quality of life.   Baseline: 57 Goal status: INITIAL  2.  Pt will decrease 5TSTS by at least 3 seconds in order to demonstrate clinically significant improvement in LE strength  Baseline: 11.82 sec w/o BUE use  Goal status: INITIAL  3.  Pt will increase by at least 86m (117ft) in order to demonstrate clinically significant improvement in cardiopulmonary endurance and community ambulation without any demonstration of L veering.   Baseline: 08/14/22: 1200' with mid-mod L veering throughout  Goal status: INITIAL  4.  Pt will increase B hip extension to >/= to  4/5 in order to improve hip extension during gait and mobility tasks. Baseline: 3-/5 Goal status: INITIAL  5.   Pt will increase B ankle DF and PF to 4+/5 Baseline: 4-/5  Goal status: INITIAL   6.  Pt will increase B hip flexor strength to >/= to 4/5  to assist in stair navigation, walking, and all other mobility tasks.  Baseline: R: 4- / L: 3+ Goal status: INITIAL  7. Pt will increase B SLS to >10 sec with no UE support to indicate decrease risk of losing balance.   Baseline: 08/19/2022: R: 4 sec, L: 8 sec  Goal status: INITIAL  ASSESSMENT:  CLINICAL IMPRESSION: Patient presents with excellent motivation throughout session. Ankle stabilization techniques are challenged on dynadisc. She additionally has scissor gait pattern with head turns indicating are of continued focus. Patient is challenged with visual scan due to use of bifocals.  Patient will benefit from skilled PT to address her strength, balance, mobility, and endurance to improve quality of life and maintain-progress PLOF.   OBJECTIVE IMPAIRMENTS: Abnormal gait, decreased activity tolerance, decreased balance, difficulty walking, decreased ROM, decreased strength, hypomobility, impaired flexibility, impaired sensation, impaired vision/preception, improper body mechanics, and impaired BLE proprioception R > L .   ACTIVITY LIMITATIONS: carrying, lifting, bending, squatting, sleeping, stairs, reach over head, and locomotion level  PARTICIPATION LIMITATIONS: meal prep, cleaning, laundry, yard work, and walking for exercise   PERSONAL FACTORS: Age, Behavior pattern, Past/current experiences, Sex, Time since onset of injury/illness/exacerbation, and 3+ comorbidities: osteopenia, HLD, arthritis, vision imapairment, and B neuropathy of feet  are also affecting patient's functional outcome.   REHAB POTENTIAL: Good  CLINICAL DECISION MAKING: Evolving/moderate complexity  EVALUATION COMPLEXITY: Moderate  PLAN:  PT FREQUENCY:  2x/week  PT DURATION: 8 weeks  PLANNED INTERVENTIONS: Therapeutic exercises, Therapeutic activity, Neuromuscular re-education, Balance training, Gait training, Patient/Family education, Self Care, Joint mobilization, Stair training, Vestibular training, Canalith repositioning, Visual/preceptual remediation/compensation, Orthotic/Fit training, Dry Needling, Cryotherapy, Moist heat, Compression bandaging, Splintting, Taping, Manual therapy, and Re-evaluation  PLAN FOR NEXT SESSION: target head turns during ambulation, progress intrinsic/extrinsic foot strength; ambulation outside / uneven surfaces; follow up about HEP, strength and balance      Precious Bard, PT 08/28/2022, 1:59 PM

## 2022-08-28 ENCOUNTER — Ambulatory Visit: Payer: Medicare Other

## 2022-08-28 DIAGNOSIS — M6281 Muscle weakness (generalized): Secondary | ICD-10-CM

## 2022-08-28 DIAGNOSIS — R208 Other disturbances of skin sensation: Secondary | ICD-10-CM

## 2022-08-28 DIAGNOSIS — R2689 Other abnormalities of gait and mobility: Secondary | ICD-10-CM | POA: Diagnosis not present

## 2022-08-28 DIAGNOSIS — R262 Difficulty in walking, not elsewhere classified: Secondary | ICD-10-CM | POA: Diagnosis not present

## 2022-08-28 DIAGNOSIS — R2681 Unsteadiness on feet: Secondary | ICD-10-CM | POA: Diagnosis not present

## 2022-08-28 NOTE — Therapy (Signed)
OUTPATIENT PHYSICAL THERAPY NEURO TREATMENT   Patient Name: Diane Tucker MRN: [id number] DOB:05/02/1933, 89 y.o., female Today's Date: 09/01/2022   PCP: Joaquim Nam, MD  REFERRING PROVIDER: Joaquim Nam, MD   END OF SESSION:  PT End of Session - 09/01/22 1445     Visit Number 5    Number of Visits 16    Date for PT Re-Evaluation 10/09/22    PT Start Time 1445    PT Stop Time 1527    PT Time Calculation (min) 42 min    Equipment Utilized During Treatment Gait belt    Activity Tolerance Patient tolerated treatment well    Behavior During Therapy WFL for tasks assessed/performed                 Past Medical History:  Diagnosis Date   Allergy    Arthritis    Dysrhythmia    Edema    ANKLES   GERD (gastroesophageal reflux disease)    HOH (hard of hearing)    Hyperlipidemia    Hypothyroidism    Migraine aura without headache (migraine equivalents)    Murmur    Neuropathy    FEET   Osteopenia    Thyroid disease    Hypothyroidism   Past Surgical History:  Procedure Laterality Date   CATARACT EXTRACTION W/PHACO Right 08/27/2015   Procedure: CATARACT EXTRACTION PHACO AND INTRAOCULAR LENS PLACEMENT (IOC);  Surgeon: Sallee Lange, MD;  Location: [facility] ORS;  Service: Ophthalmology;  Laterality: Right;  [place]  01:22 AP%24.0 CDE 34.56 fluid pack lot # [id number]   DOPPLER ECHOCARDIOGRAPHY  04/1994   MVP with MR (TR)   EYE SURGERY     Holter Monitor     NSR, freq. PVC's, rare colp, freq. PAC's   NSVD     X 2   Rheumatic Fever     (?) as a child. Hosp, prolonged fever for several days.   SKIN CANCER DESTRUCTION  06/2017   Dr. Roseanne Kaufman   TONSILLECTOMY     As a child   TOOTH EXTRACTION  02/2018   Patient Active Problem List   Diagnosis Date Noted   Balance disorder 07/30/2022   Abdominal bloating 07/30/2022   Abnormal x-ray 06/29/2022   Pelvic floor dysfunction 07/26/2021   Shoulder pain 12/30/2020   Paresthesia 12/30/2020   Advance care  planning 07/18/2018   Healthcare maintenance 07/06/2015   Hereditary and idiopathic peripheral neuropathy 07/06/2015   Osteoarthritis of finger 06/29/2014   Vitamin D deficiency 06/29/2014   Caregiver burden 06/26/2013   Medicare annual wellness visit, subsequent 05/26/2012   Back pain 04/22/2011   ALLERGY 08/06/2006   Hypothyroidism 08/05/2006   HLD (hyperlipidemia) 08/05/2006   HEARING LOSS, HIGH FREQUENCY 08/05/2006   Elevated BP without diagnosis of hypertension 08/05/2006   H/O mitral valve prolapse 08/05/2006   Allergic rhinitis 08/05/2006   POSTMENOPAUSAL STATUS 08/05/2006   Osteopenia 08/05/2006   Palpitations 08/05/2006    ONSET DATE: 2 years  REFERRING DIAG: Balance Disorder  THERAPY DIAG:  Muscle weakness (generalized)  Other abnormalities of gait and mobility  Other disturbances of skin sensation  Rationale for Evaluation and Treatment: Rehabilitation  SUBJECTIVE: Pt reports all is well since last visit.  SUBJECTIVE STATEMENT: Pt reports several years ago she started out with tingling in feet and toes, but she is not diabetic and reports she does have sensation. She finds her balance if she is walking and turns she finds herself "lurching off to the side, and unsteady". Reports she hasn't fallen recently last 6 months but has in years past. Pt walks in the woods with a walking stick but otherwise no AD. Pt reports independent with ADLs, drive, shopping.  Pt accompanied by: self  PERTINENT HISTORY: Patient is an 89 y.o. F with a PMH of osteopenia, arthritis, dysrhythmia, B ankle edema, GERD, hearing loss, HLD, hypothyroidism, migraine aura w/o HA, and neuropathy of B feet. Pt's surgical hx includes R cataract extraction (2017) and skin CA extracton (2019). Pt wishes to work on  balance and receive exercises to help her improve strength.   PAIN:  Are you having pain? No  PRECAUTIONS: Fall  WEIGHT BEARING RESTRICTIONS: No  FALLS: Has patient fallen in last 6 months?  no  LIVING ENVIRONMENT: Lives with: lives with their spouse Lives in: House/apartment Stairs: Yes: Internal: 12 steps; on left going up and External: 3 steps; can reach both Has following equipment at home: None  PLOF: Independent, Independent with basic ADLs, Leisure: walking in the woods, vacation house on a sound and enjoys kayaking, and enjoys bowling (but doesn't do it anymore), light gardening.   PATIENT GOALS: To improve balance and strength.   OBJECTIVE:    COGNITION: Overall cognitive status: Within functional limits for tasks assessed   SENSATION: Proprioception: Impaired  Big toe R absent   L mildy impaired  COORDINATION: Intact: heel to shin and nose to finger     POSTURE: rounded shoulders, forward head, increased thoracic kyphosis, and weight shift right in sitting, L veer in standing / walking   VISION:  Mildly impaired to L peripheral   LOWER EXTREMITY ROM:     Not formally assessed, although during mobility tasks all LE appear WFL.  LOWER EXTREMITY MMT:    MMT Right Eval Left Eval  Hip flexion 4- 3+  Hip extension 3 3-  Hip abduction 5 5  Hip adduction 4 4  Hip internal rotation    Hip external rotation    Knee flexion 4 4  Knee extension 4+ 4+  Ankle dorsiflexion 4- 4-  Ankle plantarflexion 4- 4-  Ankle inversion    Ankle eversion    (Blank rows = not tested)  BED MOBILITY:  Sit to supine Complete Independence Supine to sit Complete Independence Rolling to Right Complete Independence Rolling to Left Complete Independence  TRANSFERS: Assistive device utilized: None  Sit to stand: Complete Independence Stand to sit: Complete Independence Chair to chair: Complete Independence Floor:  did not assess on eval    STAIRS: Level of  Assistance: SBA Stair Negotiation Technique: Alternating Pattern  with Single Rail on Right Number of Stairs: 4  Height of Stairs: 6"  Comments: Pt demonstrated efficient foot clearance and alt pattern, and stated she must use the handrails.   GAIT: Gait pattern:  significant B over pronation, step through pattern, and lateral lean- Left Distance walked: 1300 Assistive device utilized: None Level of assistance: SBA Comments: mostly independent walking, with mild-mod L veering, pt acknowledges feeling herself drift  FUNCTIONAL TESTS:  5 times sit to stand: 11.82 no UE 6 minute walk test: 1200', mild-mod L veering throughout Functional gait assessment: 17/30  PATIENT SURVEYS:  FOTO 57  TODAY'S TREATMENT:  DATE: 09/01/22   Unless otherwise stated, CGA was provided and gait belt donned in order to ensure pt safety   TherEx: (30 min) -Seated alphabet 1x each LE for warm up  -2 x 15 B eversion / inversion with RTB; pt reported little more difficult  -15 x ankle B DF/PF with RTB, pt reported difficult  -15 x GTB Row 15x  -15 x W's w/ GTB for postural mm  - 2 x 10 seated leg ext with rainbow ball between ankles  -2 x 10 SL heel raises with emphasis on ball of foot to 1st metatarsal for calf strength  -double heel raises with B hip in ER -10 x B Glute press backs at a 45 deg angle with 3# AW -20 x B seated calf raises with 5# DB and 3# AW  NMRE:  (10 min) -4 x 75' ambulation with dual task of visual scanning  -2 x 75' Monster Walks emphasis on hip, knee, and plantar flexion. Pt reported difficult to maintain hip flexion while talking.  -speed toe taps with 6" box for coordination  -trial 1: w/ 3# AW  -trial 2: "  -trial 3: bodyweight        PATIENT EDUCATION: Education details: POC, purpose of therapy, goals  Person educated: Patient Education  method: Explanation Education comprehension: verbalized understanding and needs further education  HOME EXERCISE PROGRAM: Access Code: [email or web address] URL: [email or web address] Date: 09/01/2022 Prepared by: Precious Bard Exercises - Single Leg Heel Raise with Counter Support  - 2 x daily - 7 x weekly - 2 sets - 10 reps - 5 hold - Arch Lifting  - 2 x daily - 7 x weekly - 2 sets - 10 reps - 5 hold - Seated Ankle Pumps  - 2 x daily - 7 x weekly - 2 sets - 10 reps - 5 hold - Seated Ankle Dorsiflexion with Resistance  - 2 x daily - 7 x weekly - 1 sets - 15 reps - 5 hold - Seated Ankle Plantar Flexion with Resistance Loop  - 2 x daily - 7 x weekly - 1 sets - 15 reps - 5 hold - Seated Ankle Eversion with Resistance  - 2 x daily - 7 x weekly - 1 sets - 15 reps - 5 hold - Seated Ankle Inversion with Resistance and Legs Crossed  - 2 x daily - 7 x weekly - 1 sets - 15 reps - 5 hold   GOALS: Goals reviewed with patient? Yes  SHORT TERM GOALS: Target date: 09/11/22  Patient will be independent in home exercise program to improve   strength/mobility for better functional independence with ADLs.  Baseline: To be prescribed. Goal status: INITIAL    LONG TERM GOALS: Target date: 10/09/2022   Patient will increase FOTO score to equal to or greater than   67  to demonstrate statistically significant improvement in mobility and quality of life.   Baseline: 57 Goal status: INITIAL  2.  Pt will decrease 5TSTS by at least 3 seconds in order to demonstrate clinically significant improvement in LE strength  Baseline: 11.82 sec w/o BUE use  Goal status: INITIAL  3.  Pt will increase by at least 69m (129ft) in order to demonstrate clinically significant improvement in cardiopulmonary endurance and community ambulation without any demonstration of L veering.   Baseline: 08/14/22: 1200' with mid-mod L veering throughout  Goal status: INITIAL  4.  Pt will increase B hip extension to >/= to 4/5 in order  to improve hip extension during gait and mobility tasks. Baseline: 3-/5 Goal status: INITIAL  5.   Pt will increase B ankle DF and PF to 4+/5 Baseline: 4-/5  Goal status: INITIAL   6.  Pt will increase B hip flexor strength to >/= to 4/5 to assist in stair navigation, walking, and all other mobility tasks.  Baseline: R: 4- / L: 3+ Goal status: INITIAL  7. Pt will increase B SLS to >10 sec with no UE support to indicate decrease risk of losing balance.   Baseline: 08/19/2022: R: 4 sec, L: 8 sec  Goal status: INITIAL  ASSESSMENT:  CLINICAL IMPRESSION: Patient presents with excellent motivation throughout session. Ankle stabilization techniques are challenged on SL tasks on Airex pad. Dynamic balance tasks are difficult with dual cognition such as talking or visual scanning during ambulation. HEP was updated today to reflect DF/PF/EV/INV of ankle with RTB. Patient will benefit from skilled PT to address her strength, balance, mobility, and endurance to improve quality of life and maintain-progress PLOF.   OBJECTIVE IMPAIRMENTS: Abnormal gait, decreased activity tolerance, decreased balance, difficulty walking, decreased ROM, decreased strength, hypomobility, impaired flexibility, impaired sensation, impaired vision/preception, improper body mechanics, and impaired BLE proprioception R > L .   ACTIVITY LIMITATIONS: carrying, lifting, bending, squatting, sleeping, stairs, reach over head, and locomotion level  PARTICIPATION LIMITATIONS: meal prep, cleaning, laundry, yard work, and walking for exercise   PERSONAL FACTORS: Age, Behavior pattern, Past/current experiences, Sex, Time since onset of injury/illness/exacerbation, and 3+ comorbidities: osteopenia, HLD, arthritis, vision imapairment, and B neuropathy of feet  are also affecting patient's functional outcome.   REHAB POTENTIAL: Good  CLINICAL DECISION MAKING: Evolving/moderate complexity  EVALUATION COMPLEXITY:  Moderate  PLAN:  PT FREQUENCY: 2x/week  PT DURATION: 8 weeks  PLANNED INTERVENTIONS: Therapeutic exercises, Therapeutic activity, Neuromuscular re-education, Balance training, Gait training, Patient/Family education, Self Care, Joint mobilization, Stair training, Vestibular training, Canalith repositioning, Visual/preceptual remediation/compensation, Orthotic/Fit training, Dry Needling, Cryotherapy, Moist heat, Compression bandaging, Splintting, Taping, Manual therapy, and Re-evaluation  PLAN FOR NEXT SESSION: target head turns during ambulation, progress intrinsic/extrinsic foot strength; ambulation outside / uneven surfaces; follow up about HEP, strength and balance   Judith Blonder, SPT  This entire session was performed under direct supervision and direction of a licensed Estate agent . I have personally read, edited and approve of the note as written.  Precious Bard, PT 09/01/2022, 3:33 PM

## 2022-09-01 ENCOUNTER — Ambulatory Visit: Payer: Medicare Other

## 2022-09-01 DIAGNOSIS — R2689 Other abnormalities of gait and mobility: Secondary | ICD-10-CM | POA: Diagnosis not present

## 2022-09-01 DIAGNOSIS — R208 Other disturbances of skin sensation: Secondary | ICD-10-CM | POA: Diagnosis not present

## 2022-09-01 DIAGNOSIS — R2681 Unsteadiness on feet: Secondary | ICD-10-CM | POA: Diagnosis not present

## 2022-09-01 DIAGNOSIS — R262 Difficulty in walking, not elsewhere classified: Secondary | ICD-10-CM | POA: Diagnosis not present

## 2022-09-01 DIAGNOSIS — M6281 Muscle weakness (generalized): Secondary | ICD-10-CM | POA: Diagnosis not present

## 2022-09-03 NOTE — Therapy (Signed)
OUTPATIENT PHYSICAL THERAPY NEURO TREATMENT   Patient Name: Diane Tucker MRN: [id number] DOB:April 19, 1933, 89 y.o., female Today's Date: 09/04/2022   PCP: Joaquim Nam, MD  REFERRING PROVIDER: Joaquim Nam, MD   END OF SESSION:  PT End of Session - 09/04/22 1302     Visit Number 6    Number of Visits 16    Date for PT Re-Evaluation 10/09/22    PT Start Time 1315    PT Stop Time 1355    PT Time Calculation (min) 40 min    Equipment Utilized During Treatment Gait belt    Activity Tolerance Patient tolerated treatment well    Behavior During Therapy WFL for tasks assessed/performed                  Past Medical History:  Diagnosis Date   Allergy    Arthritis    Dysrhythmia    Edema    ANKLES   GERD (gastroesophageal reflux disease)    HOH (hard of hearing)    Hyperlipidemia    Hypothyroidism    Migraine aura without headache (migraine equivalents)    Murmur    Neuropathy    FEET   Osteopenia    Thyroid disease    Hypothyroidism   Past Surgical History:  Procedure Laterality Date   CATARACT EXTRACTION W/PHACO Right 08/27/2015   Procedure: CATARACT EXTRACTION PHACO AND INTRAOCULAR LENS PLACEMENT (IOC);  Surgeon: Sallee Lange, MD;  Location: [facility] ORS;  Service: Ophthalmology;  Laterality: Right;  [place]  01:22 AP%24.0 CDE 34.56 fluid pack lot # [id number]   DOPPLER ECHOCARDIOGRAPHY  04/1994   MVP with MR (TR)   EYE SURGERY     Holter Monitor     NSR, freq. PVC's, rare colp, freq. PAC's   NSVD     X 2   Rheumatic Fever     (?) as a child. Hosp, prolonged fever for several days.   SKIN CANCER DESTRUCTION  06/2017   Dr. Roseanne Kaufman   TONSILLECTOMY     As a child   TOOTH EXTRACTION  02/2018   Patient Active Problem List   Diagnosis Date Noted   Balance disorder 07/30/2022   Abdominal bloating 07/30/2022   Abnormal x-ray 06/29/2022   Pelvic floor dysfunction 07/26/2021   Shoulder pain 12/30/2020   Paresthesia 12/30/2020   Advance care  planning 07/18/2018   Healthcare maintenance 07/06/2015   Hereditary and idiopathic peripheral neuropathy 07/06/2015   Osteoarthritis of finger 06/29/2014   Vitamin D deficiency 06/29/2014   Caregiver burden 06/26/2013   Medicare annual wellness visit, subsequent 05/26/2012   Back pain 04/22/2011   ALLERGY 08/06/2006   Hypothyroidism 08/05/2006   HLD (hyperlipidemia) 08/05/2006   HEARING LOSS, HIGH FREQUENCY 08/05/2006   Elevated BP without diagnosis of hypertension 08/05/2006   H/O mitral valve prolapse 08/05/2006   Allergic rhinitis 08/05/2006   POSTMENOPAUSAL STATUS 08/05/2006   Osteopenia 08/05/2006   Palpitations 08/05/2006    ONSET DATE: 2 years  REFERRING DIAG: Balance Disorder  THERAPY DIAG:  Muscle weakness (generalized)  Other abnormalities of gait and mobility  Other disturbances of skin sensation  Rationale for Evaluation and Treatment: Rehabilitation  SUBJECTIVE:  Pt reports she has been doing her HEP at home, and had follow up questions re: inversion / eversion.  SUBJECTIVE STATEMENT: Pt reports several years ago she started out with tingling in feet and toes, but she is not diabetic and reports she does have sensation. She finds her balance if she is walking and turns she finds herself "lurching off to the side, and unsteady". Reports she hasn't fallen recently last 6 months but has in years past. Pt walks in the woods with a walking stick but otherwise no AD. Pt reports independent with ADLs, drive, shopping.  Pt accompanied by: self  PERTINENT HISTORY: Patient is an 89 y.o. F with a PMH of osteopenia, arthritis, dysrhythmia, B ankle edema, GERD, hearing loss, HLD, hypothyroidism, migraine aura w/o HA, and neuropathy of B feet. Pt's surgical hx includes R cataract extraction  (2017) and skin CA extracton (2019). Pt wishes to work on balance and receive exercises to help her improve strength.   PAIN:  Are you having pain? No  PRECAUTIONS: Fall  WEIGHT BEARING RESTRICTIONS: No  FALLS: Has patient fallen in last 6 months?  no  LIVING ENVIRONMENT: Lives with: lives with their spouse Lives in: House/apartment Stairs: Yes: Internal: 12 steps; on left going up and External: 3 steps; can reach both Has following equipment at home: None  PLOF: Independent, Independent with basic ADLs, Leisure: walking in the woods, vacation house on a sound and enjoys kayaking, and enjoys bowling (but doesn't do it anymore), light gardening.   PATIENT GOALS: To improve balance and strength.   OBJECTIVE:    COGNITION: Overall cognitive status: Within functional limits for tasks assessed   SENSATION: Proprioception: Impaired  Big toe R absent   L mildy impaired  COORDINATION: Intact: heel to shin and nose to finger     POSTURE: rounded shoulders, forward head, increased thoracic kyphosis, and weight shift right in sitting, L veer in standing / walking   VISION:  Mildly impaired to L peripheral   LOWER EXTREMITY ROM:     Not formally assessed, although during mobility tasks all LE appear WFL.  LOWER EXTREMITY MMT:    MMT Right Eval Left Eval  Hip flexion 4- 3+  Hip extension 3 3-  Hip abduction 5 5  Hip adduction 4 4  Hip internal rotation    Hip external rotation    Knee flexion 4 4  Knee extension 4+ 4+  Ankle dorsiflexion 4- 4-  Ankle plantarflexion 4- 4-  Ankle inversion    Ankle eversion    (Blank rows = not tested)  BED MOBILITY:  Sit to supine Complete Independence Supine to sit Complete Independence Rolling to Right Complete Independence Rolling to Left Complete Independence  TRANSFERS: Assistive device utilized: None  Sit to stand: Complete Independence Stand to sit: Complete Independence Chair to chair: Complete Independence Floor:   did not assess on eval    STAIRS: Level of Assistance: SBA Stair Negotiation Technique: Alternating Pattern  with Single Rail on Right Number of Stairs: 4  Height of Stairs: 6"  Comments: Pt demonstrated efficient foot clearance and alt pattern, and stated she must use the handrails.   GAIT: Gait pattern:  significant B over pronation, step through pattern, and lateral lean- Left Distance walked: 1300 Assistive device utilized: None Level of assistance: SBA Comments: mostly independent walking, with mild-mod L veering, pt acknowledges feeling herself drift  FUNCTIONAL TESTS:  5 times sit to stand: 11.82 no UE 6 minute walk test: 1200', mild-mod L veering throughout Functional gait assessment: 17/30  PATIENT SURVEYS:  FOTO 57  TODAY'S TREATMENT:  DATE: 09/04/22   Unless otherwise stated, CGA was provided and gait belt donned in order to ensure pt safety   TherEx: (15 min) -Seated alphabet 1x each LE for warm up  -2 x 15 B eversion / inversion with RTB; pt reported easier than last session -seated toe presses maintaining toe extension with toe abduction into dynadisc; pt reports feeling it thorughout her digitorum extensors L > R -15 x ankle B DF/PF with RTB, pt reported difficult  -10 x B Glute press backs at a 45 deg angle with 3# AW   NMRE:  (23 min) -3 x 30 sec: eyes closed standing with cue to mentally scan the body  -eyes closed walking on firm surface with tactile cue of 1.5# wrist weight on R -6 x 75' ambulation with eyes closed and 1.5# R wrist weight for proprioception to target reduction of L lateral veering  -2 x 50' Monster Walks emphasis on hip, knee, and plantar flexion. Pt reported difficult to maintain hip flexion while talking.  -1 x 50' ambulation with hip flexion activity re: bent elbows at 90 deg holding skinny PVC pipe for tactile  cue for full AROM; pt reported very difficult  -2 x 30 sec speed toe taps with 6" box for coordination; medium - difficult; pt feels her quads working hard     PATIENT EDUCATION: Education details: POC, purpose of therapy, goals  Person educated: Patient Education method: Explanation Education comprehension: verbalized understanding and needs further education  HOME EXERCISE PROGRAM: Access Code: [email or web address] URL: [email or web address] Date: 09/01/2022 Prepared by: Precious Bard Exercises - Single Leg Heel Raise with Counter Support  - 2 x daily - 7 x weekly - 2 sets - 10 reps - 5 hold - Arch Lifting  - 2 x daily - 7 x weekly - 2 sets - 10 reps - 5 hold - Seated Ankle Pumps  - 2 x daily - 7 x weekly - 2 sets - 10 reps - 5 hold - Seated Ankle Dorsiflexion with Resistance  - 2 x daily - 7 x weekly - 1 sets - 15 reps - 5 hold - Seated Ankle Plantar Flexion with Resistance Loop  - 2 x daily - 7 x weekly - 1 sets - 15 reps - 5 hold - Seated Ankle Eversion with Resistance  - 2 x daily - 7 x weekly - 1 sets - 15 reps - 5 hold - Seated Ankle Inversion with Resistance and Legs Crossed  - 2 x daily - 7 x weekly - 1 sets - 15 reps - 5 hold   GOALS: Goals reviewed with patient? Yes  SHORT TERM GOALS: Target date: 09/11/22  Patient will be independent in home exercise program to improve   strength/mobility for better functional independence with ADLs.  Baseline: To be prescribed. Goal status: INITIAL    LONG TERM GOALS: Target date: 10/09/2022   Patient will increase FOTO score to equal to or greater than   67  to demonstrate statistically significant improvement in mobility and quality of life.   Baseline: 57 Goal status: INITIAL  2.  Pt will decrease 5TSTS by at least 3 seconds in order to demonstrate clinically significant improvement in LE strength  Baseline: 11.82 sec w/o BUE use  Goal status: INITIAL  3.  Pt will increase by at least 7m (141ft) in order to demonstrate  clinically significant improvement in cardiopulmonary endurance and community ambulation without any demonstration of L veering.   Baseline: 08/14/22: 1200' with mid-mod  L veering throughout  Goal status: INITIAL  4.  Pt will increase B hip extension to >/= to 4/5 in order to improve hip extension during gait and mobility tasks. Baseline: 3-/5 Goal status: INITIAL  5.   Pt will increase B ankle DF and PF to 4+/5 Baseline: 4-/5  Goal status: INITIAL   6.  Pt will increase B hip flexor strength to >/= to 4/5 to assist in stair navigation, walking, and all other mobility tasks.  Baseline: R: 4- / L: 3+ Goal status: INITIAL  7. Pt will increase B SLS to >10 sec with no UE support to indicate decrease risk of losing balance.   Baseline: 08/19/2022: R: 4 sec, L: 8 sec  Goal status: INITIAL  ASSESSMENT:  CLINICAL IMPRESSION: Patient presents with excellent motivation throughout session. Interventions this day focused on progression of balance to include vestibular strengthening with eyes closed. Dynamic balance tasks are difficult with dual cognition such as talking or visual scanning during ambulation. During toe tap coordination activity, pt was able to increase cadence of taps but with reports of fatigue and muscle soreness in quadriceps after 2 rounds. Pt educated on muscle soreness and muscle strength. Patient will benefit from skilled PT to address her strength, balance, mobility, and endurance to improve quality of life and maintain-progress PLOF.   OBJECTIVE IMPAIRMENTS: Abnormal gait, decreased activity tolerance, decreased balance, difficulty walking, decreased ROM, decreased strength, hypomobility, impaired flexibility, impaired sensation, impaired vision/preception, improper body mechanics, and impaired BLE proprioception R > L .   ACTIVITY LIMITATIONS: carrying, lifting, bending, squatting, sleeping, stairs, reach over head, and locomotion level  PARTICIPATION LIMITATIONS: meal  prep, cleaning, laundry, yard work, and walking for exercise   PERSONAL FACTORS: Age, Behavior pattern, Past/current experiences, Sex, Time since onset of injury/illness/exacerbation, and 3+ comorbidities: osteopenia, HLD, arthritis, vision imapairment, and B neuropathy of feet  are also affecting patient's functional outcome.   REHAB POTENTIAL: Good  CLINICAL DECISION MAKING: Evolving/moderate complexity  EVALUATION COMPLEXITY: Moderate  PLAN:  PT FREQUENCY: 2x/week  PT DURATION: 8 weeks  PLANNED INTERVENTIONS: Therapeutic exercises, Therapeutic activity, Neuromuscular re-education, Balance training, Gait training, Patient/Family education, Self Care, Joint mobilization, Stair training, Vestibular training, Canalith repositioning, Visual/preceptual remediation/compensation, Orthotic/Fit training, Dry Needling, Cryotherapy, Moist heat, Compression bandaging, Splintting, Taping, Manual therapy, and Re-evaluation  PLAN FOR NEXT SESSION: target head turns during ambulation, progress intrinsic/extrinsic foot strength; ambulation outside / uneven surfaces; follow up about HEP, strength and balance. Dynamic balance with blue swiss ball    Judith Blonder, SPT  This entire session was performed under direct supervision and direction of a licensed therapist/therapist assistant . I have personally read, edited and approve of the note as written.  Precious Bard, PT 09/04/2022, 2:13 PM

## 2022-09-04 ENCOUNTER — Ambulatory Visit: Payer: Medicare Other

## 2022-09-04 DIAGNOSIS — R2689 Other abnormalities of gait and mobility: Secondary | ICD-10-CM | POA: Diagnosis not present

## 2022-09-04 DIAGNOSIS — R262 Difficulty in walking, not elsewhere classified: Secondary | ICD-10-CM | POA: Diagnosis not present

## 2022-09-04 DIAGNOSIS — M6281 Muscle weakness (generalized): Secondary | ICD-10-CM

## 2022-09-04 DIAGNOSIS — R208 Other disturbances of skin sensation: Secondary | ICD-10-CM

## 2022-09-04 DIAGNOSIS — R2681 Unsteadiness on feet: Secondary | ICD-10-CM | POA: Diagnosis not present

## 2022-09-07 NOTE — Therapy (Signed)
OUTPATIENT PHYSICAL THERAPY NEURO TREATMENT   Patient Name: Diane Tucker MRN: [id number] DOB:1933-12-20, 89 y.o., female Today's Date: 09/08/2022   PCP: Joaquim Nam, MD  REFERRING PROVIDER: Joaquim Nam, MD   END OF SESSION:  PT End of Session - 09/08/22 1015     Visit Number 7    Number of Visits 16    Date for PT Re-Evaluation 10/09/22    PT Start Time 1015    PT Stop Time 1059    PT Time Calculation (min) 44 min    Equipment Utilized During Treatment Gait belt    Activity Tolerance Patient tolerated treatment well    Behavior During Therapy WFL for tasks assessed/performed                   Past Medical History:  Diagnosis Date   Allergy    Arthritis    Dysrhythmia    Edema    ANKLES   GERD (gastroesophageal reflux disease)    HOH (hard of hearing)    Hyperlipidemia    Hypothyroidism    Migraine aura without headache (migraine equivalents)    Murmur    Neuropathy    FEET   Osteopenia    Thyroid disease    Hypothyroidism   Past Surgical History:  Procedure Laterality Date   CATARACT EXTRACTION W/PHACO Right 08/27/2015   Procedure: CATARACT EXTRACTION PHACO AND INTRAOCULAR LENS PLACEMENT (IOC);  Surgeon: Sallee Lange, MD;  Location: [facility] ORS;  Service: Ophthalmology;  Laterality: Right;  [place]  01:22 AP%24.0 CDE 34.56 fluid pack lot # [id number]   DOPPLER ECHOCARDIOGRAPHY  04/1994   MVP with MR (TR)   EYE SURGERY     Holter Monitor     NSR, freq. PVC's, rare colp, freq. PAC's   NSVD     X 2   Rheumatic Fever     (?) as a child. Hosp, prolonged fever for several days.   SKIN CANCER DESTRUCTION  06/2017   Dr. Roseanne Kaufman   TONSILLECTOMY     As a child   TOOTH EXTRACTION  02/2018   Patient Active Problem List   Diagnosis Date Noted   Balance disorder 07/30/2022   Abdominal bloating 07/30/2022   Abnormal x-ray 06/29/2022   Pelvic floor dysfunction 07/26/2021   Shoulder pain 12/30/2020   Paresthesia 12/30/2020   Advance care  planning 07/18/2018   Healthcare maintenance 07/06/2015   Hereditary and idiopathic peripheral neuropathy 07/06/2015   Osteoarthritis of finger 06/29/2014   Vitamin D deficiency 06/29/2014   Caregiver burden 06/26/2013   Medicare annual wellness visit, subsequent 05/26/2012   Back pain 04/22/2011   ALLERGY 08/06/2006   Hypothyroidism 08/05/2006   HLD (hyperlipidemia) 08/05/2006   HEARING LOSS, HIGH FREQUENCY 08/05/2006   Elevated BP without diagnosis of hypertension 08/05/2006   H/O mitral valve prolapse 08/05/2006   Allergic rhinitis 08/05/2006   POSTMENOPAUSAL STATUS 08/05/2006   Osteopenia 08/05/2006   Palpitations 08/05/2006    ONSET DATE: 2 years  REFERRING DIAG: Balance Disorder  THERAPY DIAG:  Muscle weakness (generalized)  Other abnormalities of gait and mobility  Other disturbances of skin sensation  Rationale for Evaluation and Treatment: Rehabilitation  SUBJECTIVE:  Patient reports she had a good weekend.  SUBJECTIVE STATEMENT: Pt reports several years ago she started out with tingling in feet and toes, but she is not diabetic and reports she does have sensation. She finds her balance if she is walking and turns she finds herself "lurching off to the side, and unsteady". Reports she hasn't fallen recently last 6 months but has in years past. Pt walks in the woods with a walking stick but otherwise no AD. Pt reports independent with ADLs, drive, shopping.  Pt accompanied by: self  PERTINENT HISTORY: Patient is an 89 y.o. F with a PMH of osteopenia, arthritis, dysrhythmia, B ankle edema, GERD, hearing loss, HLD, hypothyroidism, migraine aura w/o HA, and neuropathy of B feet. Pt's surgical hx includes R cataract extraction (2017) and skin CA extracton (2019). Pt wishes to work on  balance and receive exercises to help her improve strength.   PAIN:  Are you having pain? No  PRECAUTIONS: Fall  WEIGHT BEARING RESTRICTIONS: No  FALLS: Has patient fallen in last 6 months?  no  LIVING ENVIRONMENT: Lives with: lives with their spouse Lives in: House/apartment Stairs: Yes: Internal: 12 steps; on left going up and External: 3 steps; can reach both Has following equipment at home: None  PLOF: Independent, Independent with basic ADLs, Leisure: walking in the woods, vacation house on a sound and enjoys kayaking, and enjoys bowling (but doesn't do it anymore), light gardening.   PATIENT GOALS: To improve balance and strength.   OBJECTIVE:    COGNITION: Overall cognitive status: Within functional limits for tasks assessed   SENSATION: Proprioception: Impaired  Big toe R absent   L mildy impaired  COORDINATION: Intact: heel to shin and nose to finger     POSTURE: rounded shoulders, forward head, increased thoracic kyphosis, and weight shift right in sitting, L veer in standing / walking   VISION:  Mildly impaired to L peripheral   LOWER EXTREMITY ROM:     Not formally assessed, although during mobility tasks all LE appear WFL.  LOWER EXTREMITY MMT:    MMT Right Eval Left Eval  Hip flexion 4- 3+  Hip extension 3 3-  Hip abduction 5 5  Hip adduction 4 4  Hip internal rotation    Hip external rotation    Knee flexion 4 4  Knee extension 4+ 4+  Ankle dorsiflexion 4- 4-  Ankle plantarflexion 4- 4-  Ankle inversion    Ankle eversion    (Blank rows = not tested)  BED MOBILITY:  Sit to supine Complete Independence Supine to sit Complete Independence Rolling to Right Complete Independence Rolling to Left Complete Independence  TRANSFERS: Assistive device utilized: None  Sit to stand: Complete Independence Stand to sit: Complete Independence Chair to chair: Complete Independence Floor:  did not assess on eval    STAIRS: Level of  Assistance: SBA Stair Negotiation Technique: Alternating Pattern  with Single Rail on Right Number of Stairs: 4  Height of Stairs: 6"  Comments: Pt demonstrated efficient foot clearance and alt pattern, and stated she must use the handrails.   GAIT: Gait pattern:  significant B over pronation, step through pattern, and lateral lean- Left Distance walked: 1300 Assistive device utilized: None Level of assistance: SBA Comments: mostly independent walking, with mild-mod L veering, pt acknowledges feeling herself drift  FUNCTIONAL TESTS:  5 times sit to stand: 11.82 no UE 6 minute walk test: 1200', mild-mod L veering throughout Functional gait assessment: 17/30  PATIENT SURVEYS:  FOTO 57  TODAY'S TREATMENT:  DATE: 09/08/22   Unless otherwise stated, CGA was provided and gait belt donned in order to ensure pt safety   TherEx:  Seated prostretch 20x  RTB around feet march 15x each LE  10x STS with eyes closed  RTB eversion 10x Heel raise standing 15x, toe raise standing 15x  NMRE:   Airex pad 6" step modified tandem stance 30 seconds x 2 trials  Red pad: -walk across multiple trials with variations including:    -head turns vertical and horizontal x multiple trials; horizontal more challenging than vertical  Tilt board: lateral weight shift 20x   Activity Description: airex pad pod in front side and back Activity Setting:  The Blaze Pod Random setting was chosen to enhance cognitive processing and agility, providing an unpredictable environment to simulate real-world scenarios, and fostering quick reactions and adaptability.   Number of Pods:  3 Cycles/Sets:  3 Duration (Time or Hit Count):  10  Activity Description: airex pad 3 on floor 3 on table red-right green -left Activity Setting:  The Blaze Pod Random setting was chosen to enhance cognitive  processing and agility, providing an unpredictable environment to simulate real-world scenarios, and fostering quick reactions and adaptability.   Number of Pods:  6 Cycles/Sets:  2 Duration (Time or Hit Count):  30 seconds  Patient Stats   Hits:   20, 22     PATIENT EDUCATION: Education details: POC, purpose of therapy, goals  Person educated: Patient Education method: Explanation Education comprehension: verbalized understanding and needs further education  HOME EXERCISE PROGRAM: Access Code: [email or web address] URL: [email or web address] Date: 09/01/2022 Prepared by: Precious Bard Exercises - Single Leg Heel Raise with Counter Support  - 2 x daily - 7 x weekly - 2 sets - 10 reps - 5 hold - Arch Lifting  - 2 x daily - 7 x weekly - 2 sets - 10 reps - 5 hold - Seated Ankle Pumps  - 2 x daily - 7 x weekly - 2 sets - 10 reps - 5 hold - Seated Ankle Dorsiflexion with Resistance  - 2 x daily - 7 x weekly - 1 sets - 15 reps - 5 hold - Seated Ankle Plantar Flexion with Resistance Loop  - 2 x daily - 7 x weekly - 1 sets - 15 reps - 5 hold - Seated Ankle Eversion with Resistance  - 2 x daily - 7 x weekly - 1 sets - 15 reps - 5 hold - Seated Ankle Inversion with Resistance and Legs Crossed  - 2 x daily - 7 x weekly - 1 sets - 15 reps - 5 hold   GOALS: Goals reviewed with patient? Yes  SHORT TERM GOALS: Target date: 09/11/22  Patient will be independent in home exercise program to improve   strength/mobility for better functional independence with ADLs.  Baseline: To be prescribed. Goal status: INITIAL    LONG TERM GOALS: Target date: 10/09/2022   Patient will increase FOTO score to equal to or greater than   67  to demonstrate statistically significant improvement in mobility and quality of life.   Baseline: 57 Goal status: INITIAL  2.  Pt will decrease 5TSTS by at least 3 seconds in order to demonstrate clinically significant improvement in LE strength  Baseline: 11.82 sec w/o BUE  use  Goal status: INITIAL  3.  Pt will increase by at least 75m (169ft) in order to demonstrate clinically significant improvement in cardiopulmonary endurance and community ambulation without any demonstration of L veering.  Baseline: 08/14/22: 1200' with mid-mod L veering throughout  Goal status: INITIAL  4.  Pt will increase B hip extension to >/= to 4/5 in order to improve hip extension during gait and mobility tasks. Baseline: 3-/5 Goal status: INITIAL  5.   Pt will increase B ankle DF and PF to 4+/5 Baseline: 4-/5  Goal status: INITIAL   6.  Pt will increase B hip flexor strength to >/= to 4/5 to assist in stair navigation, walking, and all other mobility tasks.  Baseline: R: 4- / L: 3+ Goal status: INITIAL  7. Pt will increase B SLS to >10 sec with no UE support to indicate decrease risk of losing balance.   Baseline: 08/19/2022: R: 4 sec, L: 8 sec  Goal status: INITIAL  ASSESSMENT:  CLINICAL IMPRESSION: Patient tolerates progressive stability and strengthening. She is able to tolerate unstable surfaces with head turns this session with need for close CGA. Dual tasks are more challenging for patient at this time with dual task blaze pods challenging on airex pad. Patient remains highly motivated throughout session.  Patient will benefit from skilled PT to address her strength, balance, mobility, and endurance to improve quality of life and maintain-progress PLOF.   OBJECTIVE IMPAIRMENTS: Abnormal gait, decreased activity tolerance, decreased balance, difficulty walking, decreased ROM, decreased strength, hypomobility, impaired flexibility, impaired sensation, impaired vision/preception, improper body mechanics, and impaired BLE proprioception R > L .   ACTIVITY LIMITATIONS: carrying, lifting, bending, squatting, sleeping, stairs, reach over head, and locomotion level  PARTICIPATION LIMITATIONS: meal prep, cleaning, laundry, yard work, and walking for exercise   PERSONAL  FACTORS: Age, Behavior pattern, Past/current experiences, Sex, Time since onset of injury/illness/exacerbation, and 3+ comorbidities: osteopenia, HLD, arthritis, vision imapairment, and B neuropathy of feet  are also affecting patient's functional outcome.   REHAB POTENTIAL: Good  CLINICAL DECISION MAKING: Evolving/moderate complexity  EVALUATION COMPLEXITY: Moderate  PLAN:  PT FREQUENCY: 2x/week  PT DURATION: 8 weeks  PLANNED INTERVENTIONS: Therapeutic exercises, Therapeutic activity, Neuromuscular re-education, Balance training, Gait training, Patient/Family education, Self Care, Joint mobilization, Stair training, Vestibular training, Canalith repositioning, Visual/preceptual remediation/compensation, Orthotic/Fit training, Dry Needling, Cryotherapy, Moist heat, Compression bandaging, Splintting, Taping, Manual therapy, and Re-evaluation  PLAN FOR NEXT SESSION: target head turns during ambulation, progress intrinsic/extrinsic foot strength; ambulation outside / uneven surfaces; follow up about HEP, strength and balance. Dynamic balance with blue swiss ball     Precious Bard, PT 09/08/2022, 11:00 AM

## 2022-09-08 ENCOUNTER — Ambulatory Visit: Payer: Medicare Other

## 2022-09-08 DIAGNOSIS — M6281 Muscle weakness (generalized): Secondary | ICD-10-CM

## 2022-09-08 DIAGNOSIS — R208 Other disturbances of skin sensation: Secondary | ICD-10-CM

## 2022-09-08 DIAGNOSIS — R2689 Other abnormalities of gait and mobility: Secondary | ICD-10-CM | POA: Diagnosis not present

## 2022-09-08 DIAGNOSIS — R262 Difficulty in walking, not elsewhere classified: Secondary | ICD-10-CM | POA: Diagnosis not present

## 2022-09-08 DIAGNOSIS — R2681 Unsteadiness on feet: Secondary | ICD-10-CM | POA: Diagnosis not present

## 2022-09-10 NOTE — Therapy (Signed)
OUTPATIENT PHYSICAL THERAPY NEURO TREATMENT   Patient Name: Diane Tucker MRN: [id number] DOB:[birth year], 89 y.o., female Today's Date: 09/11/2022   PCP: Joaquim Nam, MD  REFERRING PROVIDER: Joaquim Nam, MD   END OF SESSION:  PT End of Session - 09/11/22 1003     Visit Number 8    Number of Visits 16    Date for PT Re-Evaluation 10/09/22    PT Start Time 1015    PT Stop Time 1059    PT Time Calculation (min) 44 min    Equipment Utilized During Treatment Gait belt    Activity Tolerance Patient tolerated treatment well    Behavior During Therapy WFL for tasks assessed/performed                    Past Medical History:  Diagnosis Date   Allergy    Arthritis    Dysrhythmia    Edema    ANKLES   GERD (gastroesophageal reflux disease)    HOH (hard of hearing)    Hyperlipidemia    Hypothyroidism    Migraine aura without headache (migraine equivalents)    Murmur    Neuropathy    FEET   Osteopenia    Thyroid disease    Hypothyroidism   Past Surgical History:  Procedure Laterality Date   CATARACT EXTRACTION W/PHACO Right 08/27/2015   Procedure: CATARACT EXTRACTION PHACO AND INTRAOCULAR LENS PLACEMENT (IOC);  Surgeon: Sallee Lange, MD;  Location: [facility] ORS;  Service: Ophthalmology;  Laterality: Right;  [place]  01:22 AP%24.0 CDE 34.56 fluid pack lot # [id number]   DOPPLER ECHOCARDIOGRAPHY  04/1994   MVP with MR (TR)   EYE SURGERY     Holter Monitor     NSR, freq. PVC's, rare colp, freq. PAC's   NSVD     X 2   Rheumatic Fever     (?) as a child. Hosp, prolonged fever for several days.   SKIN CANCER DESTRUCTION  06/2017   Dr. Roseanne Kaufman   TONSILLECTOMY     As a child   TOOTH EXTRACTION  02/2018   Patient Active Problem List   Diagnosis Date Noted   Balance disorder 07/30/2022   Abdominal bloating 07/30/2022   Abnormal x-ray 06/29/2022   Pelvic floor dysfunction 07/26/2021   Shoulder pain 12/30/2020   Paresthesia 12/30/2020   Advance care  planning 07/18/2018   Healthcare maintenance 07/06/2015   Hereditary and idiopathic peripheral neuropathy 07/06/2015   Osteoarthritis of finger 06/29/2014   Vitamin D deficiency 06/29/2014   Caregiver burden 06/26/2013   Medicare annual wellness visit, subsequent 05/26/2012   Back pain 04/22/2011   ALLERGY 08/06/2006   Hypothyroidism 08/05/2006   HLD (hyperlipidemia) 08/05/2006   HEARING LOSS, HIGH FREQUENCY 08/05/2006   Elevated BP without diagnosis of hypertension 08/05/2006   H/O mitral valve prolapse 08/05/2006   Allergic rhinitis 08/05/2006   POSTMENOPAUSAL STATUS 08/05/2006   Osteopenia 08/05/2006   Palpitations 08/05/2006    ONSET DATE: 2 years  REFERRING DIAG: Balance Disorder  THERAPY DIAG:  Muscle weakness (generalized)  Other abnormalities of gait and mobility  Other disturbances of skin sensation  Rationale for Evaluation and Treatment: Rehabilitation  SUBJECTIVE:  Reports her ankle is a little sore,  Feels like she overdid some exercises. Is going to the orthotist for insert today.  SUBJECTIVE STATEMENT: Pt reports several years ago she started out with tingling in feet and toes, but she is not diabetic and reports she does have sensation. She finds her balance if she is walking and turns she finds herself "lurching off to the side, and unsteady". Reports she hasn't fallen recently last 6 months but has in years past. Pt walks in the woods with a walking stick but otherwise no AD. Pt reports independent with ADLs, drive, shopping.  Pt accompanied by: self  PERTINENT HISTORY: Patient is an 89 y.o. F with a PMH of osteopenia, arthritis, dysrhythmia, B ankle edema, GERD, hearing loss, HLD, hypothyroidism, migraine aura w/o HA, and neuropathy of B feet. Pt's surgical hx includes R cataract extraction (2017) and skin CA extracton (2019). Pt  wishes to work on balance and receive exercises to help her improve strength.   PAIN:  Are you having pain? No  PRECAUTIONS: Fall  WEIGHT BEARING RESTRICTIONS: No  FALLS: Has patient fallen in last 6 months?  no  LIVING ENVIRONMENT: Lives with: lives with their spouse Lives in: House/apartment Stairs: Yes: Internal: 12 steps; on left going up and External: 3 steps; can reach both Has following equipment at home: None  PLOF: Independent, Independent with basic ADLs, Leisure: walking in the woods, vacation house on a sound and enjoys kayaking, and enjoys bowling (but doesn't do it anymore), light gardening.   PATIENT GOALS: To improve balance and strength.   OBJECTIVE:    COGNITION: Overall cognitive status: Within functional limits for tasks assessed   SENSATION: Proprioception: Impaired  Big toe R absent   L mildy impaired  COORDINATION: Intact: heel to shin and nose to finger     POSTURE: rounded shoulders, forward head, increased thoracic kyphosis, and weight shift right in sitting, L veer in standing / walking   VISION:  Mildly impaired to L peripheral   LOWER EXTREMITY ROM:     Not formally assessed, although during mobility tasks all LE appear WFL.  LOWER EXTREMITY MMT:    MMT Right Eval Left Eval  Hip flexion 4- 3+  Hip extension 3 3-  Hip abduction 5 5  Hip adduction 4 4  Hip internal rotation    Hip external rotation    Knee flexion 4 4  Knee extension 4+ 4+  Ankle dorsiflexion 4- 4-  Ankle plantarflexion 4- 4-  Ankle inversion    Ankle eversion    (Blank rows = not tested)  BED MOBILITY:  Sit to supine Complete Independence Supine to sit Complete Independence Rolling to Right Complete Independence Rolling to Left Complete Independence  TRANSFERS: Assistive device utilized: None  Sit to stand: Complete Independence Stand to sit: Complete Independence Chair to chair: Complete Independence Floor:  did not assess on eval     STAIRS: Level of Assistance: SBA Stair Negotiation Technique: Alternating Pattern  with Single Rail on Right Number of Stairs: 4  Height of Stairs: 6"  Comments: Pt demonstrated efficient foot clearance and alt pattern, and stated she must use the handrails.   GAIT: Gait pattern:  significant B over pronation, step through pattern, and lateral lean- Left Distance walked: 1300 Assistive device utilized: None Level of assistance: SBA Comments: mostly independent walking, with mild-mod L veering, pt acknowledges feeling herself drift  FUNCTIONAL TESTS:  5 times sit to stand: 11.82 no UE 6 minute walk test: 1200', mild-mod L veering throughout Functional gait assessment: 17/30  PATIENT SURVEYS:  FOTO 57  TODAY'S TREATMENT:  DATE: 09/11/22   Unless otherwise stated, CGA was provided and gait belt donned in order to ensure pt safety   TherEx:  3lb ankle weights: -march 15x each LE -LAQ 10x, 3 second holds at top each LE   NMRE:    ambulate in hallway: -horizontal head turns with cues for reading alphabet from cards 86 ftx 2 sets -horizontal head turns with cues for reading number and symbols from cards 86 ft x 2 sets  -ball toss to self 150 ft -ball bounce 150 ft   Balance Master:penguin game for weight shift, stabilization on unstable surface and reaction responses. 9 fish first trial, 12 fish second trial     Activity Description: airex pad 3 on floor 3 on table red-right green -left Activity Setting:  The [employer] Random setting was chosen to enhance cognitive processing and agility, providing an unpredictable environment to simulate real-world scenarios, and fostering quick reactions and adaptability.   Number of Pods:  6 Cycles/Sets:  2 Duration (Time or Hit Count):  30 seconds  Patient Stats   Hits:   22,20, 26  Activity Description:  5 in circle, one in center; added pad for last two  Activity Setting:  The [facility] setting was selected for the goal of improving spatial awareness and visual scanning ability, establishing a central point of reference during dynamic movements for enhanced balance and control.   Number of Pods:  6 Cycles/Sets:  3 Duration (Time or Hit Count):  10        PATIENT EDUCATION: Education details: POC, purpose of therapy, goals  Person educated: Patient Education method: Explanation Education comprehension: verbalized understanding and needs further education  HOME EXERCISE PROGRAM: Access Code: [email or web address] URL: [email or web address] Date: 09/01/2022 Prepared by: Precious Bard Exercises - Single Leg Heel Raise with Counter Support  - 2 x daily - 7 x weekly - 2 sets - 10 reps - 5 hold - Arch Lifting  - 2 x daily - 7 x weekly - 2 sets - 10 reps - 5 hold - Seated Ankle Pumps  - 2 x daily - 7 x weekly - 2 sets - 10 reps - 5 hold - Seated Ankle Dorsiflexion with Resistance  - 2 x daily - 7 x weekly - 1 sets - 15 reps - 5 hold - Seated Ankle Plantar Flexion with Resistance Loop  - 2 x daily - 7 x weekly - 1 sets - 15 reps - 5 hold - Seated Ankle Eversion with Resistance  - 2 x daily - 7 x weekly - 1 sets - 15 reps - 5 hold - Seated Ankle Inversion with Resistance and Legs Crossed  - 2 x daily - 7 x weekly - 1 sets - 15 reps - 5 hold   GOALS: Goals reviewed with patient? Yes  SHORT TERM GOALS: Target date: 09/11/22  Patient will be independent in home exercise program to improve   strength/mobility for better functional independence with ADLs.  Baseline: To be prescribed. Goal status: INITIAL    LONG TERM GOALS: Target date: 10/09/2022   Patient will increase FOTO score to equal to or greater than   67  to demonstrate statistically significant improvement in mobility and quality of life.   Baseline: 57 Goal status: INITIAL  2.  Pt will decrease 5TSTS by at least 3  seconds in order to demonstrate clinically significant improvement in LE strength  Baseline: 11.82 sec w/o BUE use  Goal status: INITIAL  3.  Pt  will increase by at least 75m (143ft) in order to demonstrate clinically significant improvement in cardiopulmonary endurance and community ambulation without any demonstration of L veering.   Baseline: 08/14/22: 1200' with mid-mod L veering throughout  Goal status: INITIAL  4.  Pt will increase B hip extension to >/= to 4/5 in order to improve hip extension during gait and mobility tasks. Baseline: 3-/5 Goal status: INITIAL  5.   Pt will increase B ankle DF and PF to 4+/5 Baseline: 4-/5  Goal status: INITIAL   6.  Pt will increase B hip flexor strength to >/= to 4/5 to assist in stair navigation, walking, and all other mobility tasks.  Baseline: R: 4- / L: 3+ Goal status: INITIAL  7. Pt will increase B SLS to >10 sec with no UE support to indicate decrease risk of losing balance.   Baseline: 08/19/2022: R: 4 sec, L: 8 sec  Goal status: INITIAL  ASSESSMENT:  CLINICAL IMPRESSION: Patient presents with excellent motivation. She tolerates progressive stabilization exercises without pain. Turning on unstable surface is challenging. Head turns result in less scissor stepping demonstrating carryover between sessions.  Patient will benefit from skilled PT to address her strength, balance, mobility, and endurance to improve quality of life and maintain-progress PLOF.   OBJECTIVE IMPAIRMENTS: Abnormal gait, decreased activity tolerance, decreased balance, difficulty walking, decreased ROM, decreased strength, hypomobility, impaired flexibility, impaired sensation, impaired vision/preception, improper body mechanics, and impaired BLE proprioception R > L .   ACTIVITY LIMITATIONS: carrying, lifting, bending, squatting, sleeping, stairs, reach over head, and locomotion level  PARTICIPATION LIMITATIONS: meal prep, cleaning, laundry, yard work, and  walking for exercise   PERSONAL FACTORS: Age, Behavior pattern, Past/current experiences, Sex, Time since onset of injury/illness/exacerbation, and 3+ comorbidities: osteopenia, HLD, arthritis, vision imapairment, and B neuropathy of feet  are also affecting patient's functional outcome.   REHAB POTENTIAL: Good  CLINICAL DECISION MAKING: Evolving/moderate complexity  EVALUATION COMPLEXITY: Moderate  PLAN:  PT FREQUENCY: 2x/week  PT DURATION: 8 weeks  PLANNED INTERVENTIONS: Therapeutic exercises, Therapeutic activity, Neuromuscular re-education, Balance training, Gait training, Patient/Family education, Self Care, Joint mobilization, Stair training, Vestibular training, Canalith repositioning, Visual/preceptual remediation/compensation, Orthotic/Fit training, Dry Needling, Cryotherapy, Moist heat, Compression bandaging, Splintting, Taping, Manual therapy, and Re-evaluation  PLAN FOR NEXT SESSION: target head turns during ambulation, progress intrinsic/extrinsic foot strength; ambulation outside / uneven surfaces; follow up about HEP, strength and balance. Dynamic balance with blue swiss ball     Precious Bard, PT 09/11/2022, 10:59 AM

## 2022-09-11 ENCOUNTER — Ambulatory Visit: Payer: Medicare Other | Admitting: Family Medicine

## 2022-09-11 ENCOUNTER — Ambulatory Visit: Payer: Medicare Other

## 2022-09-11 ENCOUNTER — Encounter: Payer: Self-pay | Admitting: Family Medicine

## 2022-09-11 VITALS — BP 140/70 | HR 77 | Temp 97.3°F | Ht 62.0 in | Wt 150.1 lb

## 2022-09-11 DIAGNOSIS — R208 Other disturbances of skin sensation: Secondary | ICD-10-CM

## 2022-09-11 DIAGNOSIS — M216X2 Other acquired deformities of left foot: Secondary | ICD-10-CM

## 2022-09-11 DIAGNOSIS — M216X1 Other acquired deformities of right foot: Secondary | ICD-10-CM | POA: Diagnosis not present

## 2022-09-11 DIAGNOSIS — R2689 Other abnormalities of gait and mobility: Secondary | ICD-10-CM | POA: Diagnosis not present

## 2022-09-11 DIAGNOSIS — M79672 Pain in left foot: Secondary | ICD-10-CM | POA: Diagnosis not present

## 2022-09-11 DIAGNOSIS — M21611 Bunion of right foot: Secondary | ICD-10-CM | POA: Diagnosis not present

## 2022-09-11 DIAGNOSIS — M79671 Pain in right foot: Secondary | ICD-10-CM | POA: Diagnosis not present

## 2022-09-11 DIAGNOSIS — R2681 Unsteadiness on feet: Secondary | ICD-10-CM | POA: Diagnosis not present

## 2022-09-11 DIAGNOSIS — R262 Difficulty in walking, not elsewhere classified: Secondary | ICD-10-CM | POA: Diagnosis not present

## 2022-09-11 DIAGNOSIS — M21612 Bunion of left foot: Secondary | ICD-10-CM

## 2022-09-11 DIAGNOSIS — G8929 Other chronic pain: Secondary | ICD-10-CM | POA: Diagnosis not present

## 2022-09-11 DIAGNOSIS — M6281 Muscle weakness (generalized): Secondary | ICD-10-CM

## 2022-09-11 NOTE — Progress Notes (Signed)
Diane Badley T. Cynthia Stainback, MD, CAQ Sports Medicine [facility] at [facility] [address] [place], [address]  Phone: [phone]  FAX: [phone]  Diane Tucker - 89 y.o. female  MRN [id number]  Date of Birth: [birth year]  Date: 09/11/2022  PCP: Joaquim Nam, MD  Referral: Joaquim Nam, MD  Chief Complaint  Patient presents with   Foot Orthotics   Subjective:   Diane Tucker is a 89 y.o. very pleasant female patient with Body mass index is 27.44 kg/m. who presents with the following:  Patient is a new consultation from Dr. Para March to consider orthotics manufacturing.    She has been working on physical therapy for balance, to decrease her risk of potential falls with osteopenia at age 65.  She does have intermittent foot pain with significant bunion and bunionette formation as well as broad forefoot and overall pes planus.  She is observed ambulating, and she has significant pronation with walking bilaterally.  Review of Systems is noted in the HPI, as appropriate  Objective:   Ht 5\' 2"  (1.575 m)   BMI 27.44 kg/m   GEN: No acute distress; alert,appropriate. PULM: Breathing comfortably in no respiratory distress PSYCH: Normally interactive.   Mild tenderness to palpation relatively diffusely with some pain in the forefoot. Significant bunion and bunionette formation with global forefoot breakdown and loss of transverse arch. Does have dropped metatarsal heads bilaterally  She pronates significantly with walking She does have some heel breakdown  Nontender at the talus, navicular, cuboid, cuneiform, base of fifth metatarsal, medial and lateral malleolus. There is no significant tenderness to the ATFL, CFL, or deltoid ligaments No significant tenderness at the Achilles  Laboratory and Imaging Data:  Assessment and Plan:     ICD-10-CM   1. Chronic foot pain, right  M79.671    G89.29     2. Chronic foot pain, left  M79.672     G89.29     3. Pronation of both feet  M21.6X1    M21.6X2     4. Bilateral bunions  M21.611    M21.612      Total encounter time: 40 minutes. This includes total time spent on the day of encounter.  Gait analysis, orthotic prep and fitting.   Patient was fitted for a standard, cushioned, semi-rigid orthotic.  The orthotic was heated and the patient stood on the orthotic blank positioned on the orthotic stand. The patient was positioned in subtalar neutral position and 10 degrees of ankle dorsiflexion in a weight bearing stance. After molding, a stable [employer] EVA base was applied to the orthotic blank.   The blank was ground to a stable position for weight bearing. size: 8 base: black F2 posting: none additional orthotic padding: none   Disposition: f/u prn  Dragon [employer] One speech-to-text software was used for transcription in this dictation.  Possible transcriptional errors can occur using Animal nutritionist.   Signed,  Elpidio Galea. Reiley Keisler, MD   Outpatient Encounter Medications as of 09/11/2022  Medication Sig   alendronate (FOSAMAX) 10 MG tablet TAKE 1 TABLET DAILY BEFORE BREAKFAST WITH A FULL GLASS OF WATER ON AN EMPTY STOMACH   atorvastatin (LIPITOR) 10 MG tablet TAKE 1 TABLET DAILY   calcium carbonate (TUMS - DOSED IN MG ELEMENTAL CALCIUM) 500 MG chewable tablet Chew 1 tablet by mouth as needed for indigestion or heartburn.   Cholecalciferol (VITAMIN D3) 1000 units CAPS Take 2 capsules by mouth daily.    Coenzyme Q10 (  COQ10) 200 MG CAPS Take 1 capsule by mouth daily.   Cyanocobalamin (VITAMIN B 12) 500 MCG TABS daily at 6 (six) AM.   hydroxypropyl methylcellulose (ISOPTO TEARS) 2.5 % ophthalmic solution 1 drop 3 (three) times daily as needed.   Multiple Vitamin (MULTIVITAMIN) tablet Take 1 tablet by mouth daily.   naproxen sodium (ALEVE) 220 MG tablet Take 220 mg by mouth daily as needed.   Oxymetazoline HCl (AFRIN NASAL SPRAY NA) Place into the nose.   simethicone  (MYLICON) 125 MG chewable tablet Chew 125 mg by mouth every 6 (six) hours as needed for flatulence.   sodium chloride (OCEAN) 0.65 % nasal spray Place 1 spray into the nose as needed.   SYNTHROID 100 MCG tablet TAKE 1 TABLET DAILY   No facility-administered encounter medications on file as of 09/11/2022.

## 2022-09-15 ENCOUNTER — Ambulatory Visit: Payer: Medicare Other

## 2022-09-17 ENCOUNTER — Ambulatory Visit: Payer: Medicare Other

## 2022-09-17 DIAGNOSIS — R2681 Unsteadiness on feet: Secondary | ICD-10-CM | POA: Diagnosis not present

## 2022-09-17 DIAGNOSIS — R262 Difficulty in walking, not elsewhere classified: Secondary | ICD-10-CM

## 2022-09-17 DIAGNOSIS — M6281 Muscle weakness (generalized): Secondary | ICD-10-CM

## 2022-09-17 DIAGNOSIS — R208 Other disturbances of skin sensation: Secondary | ICD-10-CM | POA: Diagnosis not present

## 2022-09-17 DIAGNOSIS — R2689 Other abnormalities of gait and mobility: Secondary | ICD-10-CM | POA: Diagnosis not present

## 2022-09-17 NOTE — Therapy (Signed)
OUTPATIENT PHYSICAL THERAPY NEURO TREATMENT   Patient Name: Diane Tucker MRN: [id number] DOB:1933-10-15, 89 y.o., female Today's Date: 09/17/2022   PCP: Joaquim Nam, MD  REFERRING PROVIDER: Joaquim Nam, MD   END OF SESSION:  PT End of Session - 09/17/22 1401     Visit Number 9    Number of Visits 16    Date for PT Re-Evaluation 10/09/22    PT Start Time 1402    PT Stop Time 1442    PT Time Calculation (min) 40 min    Equipment Utilized During Treatment Gait belt    Activity Tolerance Patient tolerated treatment well    Behavior During Therapy WFL for tasks assessed/performed                     Past Medical History:  Diagnosis Date   Allergy    Arthritis    Dysrhythmia    Edema    ANKLES   GERD (gastroesophageal reflux disease)    HOH (hard of hearing)    Hyperlipidemia    Hypothyroidism    Migraine aura without headache (migraine equivalents)    Murmur    Neuropathy    FEET   Osteopenia    Thyroid disease    Hypothyroidism   Past Surgical History:  Procedure Laterality Date   CATARACT EXTRACTION W/PHACO Right 08/27/2015   Procedure: CATARACT EXTRACTION PHACO AND INTRAOCULAR LENS PLACEMENT (IOC);  Surgeon: Sallee Lange, MD;  Location: [facility] ORS;  Service: Ophthalmology;  Laterality: Right;  [place]  01:22 AP%24.0 CDE 34.56 fluid pack lot # [id number]   DOPPLER ECHOCARDIOGRAPHY  04/1994   MVP with MR (TR)   EYE SURGERY     Holter Monitor     NSR, freq. PVC's, rare colp, freq. PAC's   NSVD     X 2   Rheumatic Fever     (?) as a child. Hosp, prolonged fever for several days.   SKIN CANCER DESTRUCTION  06/2017   Dr. Roseanne Kaufman   TONSILLECTOMY     As a child   TOOTH EXTRACTION  02/2018   Patient Active Problem List   Diagnosis Date Noted   Balance disorder 07/30/2022   Abdominal bloating 07/30/2022   Abnormal x-ray 06/29/2022   Pelvic floor dysfunction 07/26/2021   Shoulder pain 12/30/2020   Paresthesia 12/30/2020   Advance  care planning 07/18/2018   Healthcare maintenance 07/06/2015   Hereditary and idiopathic peripheral neuropathy 07/06/2015   Osteoarthritis of finger 06/29/2014   Vitamin D deficiency 06/29/2014   Caregiver burden 06/26/2013   Medicare annual wellness visit, subsequent 05/26/2012   Back pain 04/22/2011   ALLERGY 08/06/2006   Hypothyroidism 08/05/2006   HLD (hyperlipidemia) 08/05/2006   HEARING LOSS, HIGH FREQUENCY 08/05/2006   Elevated BP without diagnosis of hypertension 08/05/2006   H/O mitral valve prolapse 08/05/2006   Allergic rhinitis 08/05/2006   POSTMENOPAUSAL STATUS 08/05/2006   Osteopenia 08/05/2006   Palpitations 08/05/2006    ONSET DATE: 2 years  REFERRING DIAG: Balance Disorder  THERAPY DIAG:  Unsteadiness on feet  Muscle weakness (generalized)  Difficulty in walking, not elsewhere classified  Rationale for Evaluation and Treatment: Rehabilitation  SUBJECTIVE:  Pt reports no aches/pains. She thinks she is making a little progress with her balance. Pt reports no falls, but she does report she is unsteady at night when she gets up to go to the bathroom. She reports some unsteadiness with turns.  SUBJECTIVE STATEMENT: Pt reports several years ago she started out with tingling in feet and toes, but she is not diabetic and reports she does have sensation. She finds her balance if she is walking and turns she finds herself "lurching off to the side, and unsteady". Reports she hasn't fallen recently last 6 months but has in years past. Pt walks in the woods with a walking stick but otherwise no AD. Pt reports independent with ADLs, drive, shopping.  Pt accompanied by: self  PERTINENT HISTORY: Patient is an 89 y.o. F with a PMH of osteopenia, arthritis, dysrhythmia, B ankle edema, GERD, hearing loss, HLD, hypothyroidism, migraine aura w/o HA, and  neuropathy of B feet. Pt's surgical hx includes R cataract extraction (2017) and skin CA extracton (2019). Pt wishes to work on balance and receive exercises to help her improve strength.   PAIN:  Are you having pain? No  PRECAUTIONS: Fall  WEIGHT BEARING RESTRICTIONS: No  FALLS: Has patient fallen in last 6 months?  no  LIVING ENVIRONMENT: Lives with: lives with their spouse Lives in: House/apartment Stairs: Yes: Internal: 12 steps; on left going up and External: 3 steps; can reach both Has following equipment at home: None  PLOF: Independent, Independent with basic ADLs, Leisure: walking in the woods, vacation house on a sound and enjoys kayaking, and enjoys bowling (but doesn't do it anymore), light gardening.   PATIENT GOALS: To improve balance and strength.   OBJECTIVE:    COGNITION: Overall cognitive status: Within functional limits for tasks assessed   SENSATION: Proprioception: Impaired  Big toe R absent   L mildy impaired  COORDINATION: Intact: heel to shin and nose to finger     POSTURE: rounded shoulders, forward head, increased thoracic kyphosis, and weight shift right in sitting, L veer in standing / walking   VISION:  Mildly impaired to L peripheral   LOWER EXTREMITY ROM:     Not formally assessed, although during mobility tasks all LE appear WFL.  LOWER EXTREMITY MMT:    MMT Right Eval Left Eval  Hip flexion 4- 3+  Hip extension 3 3-  Hip abduction 5 5  Hip adduction 4 4  Hip internal rotation    Hip external rotation    Knee flexion 4 4  Knee extension 4+ 4+  Ankle dorsiflexion 4- 4-  Ankle plantarflexion 4- 4-  Ankle inversion    Ankle eversion    (Blank rows = not tested)  BED MOBILITY:  Sit to supine Complete Independence Supine to sit Complete Independence Rolling to Right Complete Independence Rolling to Left Complete Independence  TRANSFERS: Assistive device utilized: None  Sit to stand: Complete Independence Stand to sit:  Complete Independence Chair to chair: Complete Independence Floor:  did not assess on eval    STAIRS: Level of Assistance: SBA Stair Negotiation Technique: Alternating Pattern  with Single Rail on Right Number of Stairs: 4  Height of Stairs: 6"  Comments: Pt demonstrated efficient foot clearance and alt pattern, and stated she must use the handrails.   GAIT: Gait pattern:  significant B over pronation, step through pattern, and lateral lean- Left Distance walked: 1300 Assistive device utilized: None Level of assistance: SBA Comments: mostly independent walking, with mild-mod L veering, pt acknowledges feeling herself drift  FUNCTIONAL TESTS:  5 times sit to stand: 11.82 no UE 6 minute walk test: 1200', mild-mod L veering throughout Functional gait assessment: 17/30  PATIENT SURVEYS:  FOTO 57  TODAY'S TREATMENT:  DATE: 09/17/22   Unless otherwise stated, CGA was provided and gait belt donned in order to ensure pt safety   TherEx:  3lb ankle weights: -LAQ 10x with 3 second holds at top each LE  -march 15x each LE. Rates medium Heel raise standing 15x bilat, toe raise standing 15x bilat  BTB hip abduction 2x12 bilat   10x 8x STS with eyes closed. Rates medium. Pt exhibits good balance, fatigues with last two reps first set     NMRE:    Over 10 meters: Ambulation with EC 4x. No greater than CGA, pt reports slight confidence in balance, does exhibit decreased gait speed/decreased step-length but no loss of stability    ambulate in hallway: -head turns to read sticky notes/pictures placed on walls 2x length of hallway -ambulation with horizontal head turns only 4x length of hallway -ambulation with vertical head turns 2x length of hallway Comments: mild instability throughout, cuing for technique, no greater than CGA, pt able to self-correct with  step-strategy   On airex: WBOS EC 2x30 sec NBOS EC 2x30 sec - CGA-min assist, rates hard NBOS with horizontal and veritcal head turns Slow marching 20x alt LE - difficult, intermittent UE suport    PATIENT EDUCATION: Education details: Pt educated throughout session about proper posture and technique with exercises. Improved exercise technique, movement at target joints, use of target muscles after min to mod verbal, visual, tactile cues.  Person educated: Patient Education method: Explanation, Demonstration, and Verbal cues Education comprehension: verbalized understanding and needs further education  HOME EXERCISE PROGRAM: Access Code: [email or web address] URL: [email or web address] Date: 09/01/2022 Prepared by: Precious Bard Exercises - Single Leg Heel Raise with Counter Support  - 2 x daily - 7 x weekly - 2 sets - 10 reps - 5 hold - Arch Lifting  - 2 x daily - 7 x weekly - 2 sets - 10 reps - 5 hold - Seated Ankle Pumps  - 2 x daily - 7 x weekly - 2 sets - 10 reps - 5 hold - Seated Ankle Dorsiflexion with Resistance  - 2 x daily - 7 x weekly - 1 sets - 15 reps - 5 hold - Seated Ankle Plantar Flexion with Resistance Loop  - 2 x daily - 7 x weekly - 1 sets - 15 reps - 5 hold - Seated Ankle Eversion with Resistance  - 2 x daily - 7 x weekly - 1 sets - 15 reps - 5 hold - Seated Ankle Inversion with Resistance and Legs Crossed  - 2 x daily - 7 x weekly - 1 sets - 15 reps - 5 hold   GOALS: Goals reviewed with patient? Yes  SHORT TERM GOALS: Target date: 09/11/22  Patient will be independent in home exercise program to improve   strength/mobility for better functional independence with ADLs.  Baseline: To be prescribed. Goal status: INITIAL    LONG TERM GOALS: Target date: 10/09/2022   Patient will increase FOTO score to equal to or greater than   67  to demonstrate statistically significant improvement in mobility and quality of life.   Baseline: 57 Goal status: INITIAL  2.  Pt  will decrease 5TSTS by at least 3 seconds in order to demonstrate clinically significant improvement in LE strength  Baseline: 11.82 sec w/o BUE use  Goal status: INITIAL  3.  Pt will increase by at least 38m (171ft) in order to demonstrate clinically significant improvement in cardiopulmonary endurance and community ambulation without any demonstration of L  veering.   Baseline: 08/14/22: 1200' with mid-mod L veering throughout  Goal status: INITIAL  4.  Pt will increase B hip extension to >/= to 4/5 in order to improve hip extension during gait and mobility tasks. Baseline: 3-/5 Goal status: INITIAL  5.   Pt will increase B ankle DF and PF to 4+/5 Baseline: 4-/5  Goal status: INITIAL   6.  Pt will increase B hip flexor strength to >/= to 4/5 to assist in stair navigation, walking, and all other mobility tasks.  Baseline: R: 4- / L: 3+ Goal status: INITIAL  7. Pt will increase B SLS to >10 sec with no UE support to indicate decrease risk of losing balance.   Baseline: 08/19/2022: R: 4 sec, L: 8 sec  Goal status: INITIAL  ASSESSMENT:  CLINICAL IMPRESSION: Chartered loss adjuster continued plan as laid out in previous sessions. Pt with excellent motivation to participate in PT and tolerated interventions well without pain or significant fatigue. She was able to progress STS intervention with increased reps and exhibited good balance. Pt most challenged with marching on airex and EC on compliant surfaces.  Patient will benefit from skilled PT to address her strength, balance, mobility, and endurance to improve quality of life and maintain-progress PLOF.   OBJECTIVE IMPAIRMENTS: Abnormal gait, decreased activity tolerance, decreased balance, difficulty walking, decreased ROM, decreased strength, hypomobility, impaired flexibility, impaired sensation, impaired vision/preception, improper body mechanics, and impaired BLE proprioception R > L .   ACTIVITY LIMITATIONS: carrying, lifting, bending,  squatting, sleeping, stairs, reach over head, and locomotion level  PARTICIPATION LIMITATIONS: meal prep, cleaning, laundry, yard work, and walking for exercise   PERSONAL FACTORS: Age, Behavior pattern, Past/current experiences, Sex, Time since onset of injury/illness/exacerbation, and 3+ comorbidities: osteopenia, HLD, arthritis, vision imapairment, and B neuropathy of feet  are also affecting patient's functional outcome.   REHAB POTENTIAL: Good  CLINICAL DECISION MAKING: Evolving/moderate complexity  EVALUATION COMPLEXITY: Moderate  PLAN:  PT FREQUENCY: 2x/week  PT DURATION: 8 weeks  PLANNED INTERVENTIONS: Therapeutic exercises, Therapeutic activity, Neuromuscular re-education, Balance training, Gait training, Patient/Family education, Self Care, Joint mobilization, Stair training, Vestibular training, Canalith repositioning, Visual/preceptual remediation/compensation, Orthotic/Fit training, Dry Needling, Cryotherapy, Moist heat, Compression bandaging, Splintting, Taping, Manual therapy, and Re-evaluation  PLAN FOR NEXT SESSION: target head turns during ambulation, progress intrinsic/extrinsic foot strength; ambulation outside / uneven surfaces; follow up about HEP, strength and balance. Dynamic balance with blue swiss ball     Baird Kay, PT 09/17/2022, 2:53 PM

## 2022-09-29 ENCOUNTER — Ambulatory Visit: Payer: Medicare Other | Attending: Family Medicine

## 2022-09-29 DIAGNOSIS — R262 Difficulty in walking, not elsewhere classified: Secondary | ICD-10-CM | POA: Insufficient documentation

## 2022-09-29 DIAGNOSIS — M6281 Muscle weakness (generalized): Secondary | ICD-10-CM | POA: Diagnosis not present

## 2022-09-29 DIAGNOSIS — R2681 Unsteadiness on feet: Secondary | ICD-10-CM | POA: Insufficient documentation

## 2022-09-29 DIAGNOSIS — R208 Other disturbances of skin sensation: Secondary | ICD-10-CM | POA: Diagnosis not present

## 2022-09-29 DIAGNOSIS — R2689 Other abnormalities of gait and mobility: Secondary | ICD-10-CM | POA: Insufficient documentation

## 2022-09-29 NOTE — Therapy (Signed)
OUTPATIENT PHYSICAL THERAPY TREATMENT/Reassessment Physical Therapy Progress Note   Dates of reporting period:  eval   to   09/29/22    Patient Name: Diane Tucker MRN: [id number] DOB:08/29/1933, 89 y.o., female Today's Date: 09/29/2022   PCP: Joaquim Nam, MD  REFERRING PROVIDER: Joaquim Nam, MD   END OF SESSION:  PT End of Session - 09/29/22 0914     Visit Number 10    Number of Visits 16    Date for PT Re-Evaluation 10/09/22    PT Start Time 0845    PT Stop Time 0925    PT Time Calculation (min) 40 min    Equipment Utilized During Treatment Gait belt    Activity Tolerance Patient tolerated treatment well;No increased pain    Behavior During Therapy WFL for tasks assessed/performed                     Past Medical History:  Diagnosis Date   Allergy    Arthritis    Dysrhythmia    Edema    ANKLES   GERD (gastroesophageal reflux disease)    HOH (hard of hearing)    Hyperlipidemia    Hypothyroidism    Migraine aura without headache (migraine equivalents)    Murmur    Neuropathy    FEET   Osteopenia    Thyroid disease    Hypothyroidism   Past Surgical History:  Procedure Laterality Date   CATARACT EXTRACTION W/PHACO Right 08/27/2015   Procedure: CATARACT EXTRACTION PHACO AND INTRAOCULAR LENS PLACEMENT (IOC);  Surgeon: Sallee Lange, MD;  Location: [facility] ORS;  Service: Ophthalmology;  Laterality: Right;  [place]  01:22 AP%24.0 CDE 34.56 fluid pack lot # [id number]   DOPPLER ECHOCARDIOGRAPHY  04/1994   MVP with MR (TR)   EYE SURGERY     Holter Monitor     NSR, freq. PVC's, rare colp, freq. PAC's   NSVD     X 2   Rheumatic Fever     (?) as a child. Hosp, prolonged fever for several days.   SKIN CANCER DESTRUCTION  06/2017   Dr. Roseanne Kaufman   TONSILLECTOMY     As a child   TOOTH EXTRACTION  02/2018   Patient Active Problem List   Diagnosis Date Noted   Balance disorder 07/30/2022   Abdominal bloating 07/30/2022   Abnormal x-ray  06/29/2022   Pelvic floor dysfunction 07/26/2021   Shoulder pain 12/30/2020   Paresthesia 12/30/2020   Advance care planning 07/18/2018   Healthcare maintenance 07/06/2015   Hereditary and idiopathic peripheral neuropathy 07/06/2015   Osteoarthritis of finger 06/29/2014   Vitamin D deficiency 06/29/2014   Caregiver burden 06/26/2013   Medicare annual wellness visit, subsequent 05/26/2012   Back pain 04/22/2011   ALLERGY 08/06/2006   Hypothyroidism 08/05/2006   HLD (hyperlipidemia) 08/05/2006   HEARING LOSS, HIGH FREQUENCY 08/05/2006   Elevated BP without diagnosis of hypertension 08/05/2006   H/O mitral valve prolapse 08/05/2006   Allergic rhinitis 08/05/2006   POSTMENOPAUSAL STATUS 08/05/2006   Osteopenia 08/05/2006   Palpitations 08/05/2006    ONSET DATE: 2 years  REFERRING DIAG: Balance Disorder  THERAPY DIAG:  Unsteadiness on feet  Muscle weakness (generalized)  Difficulty in walking, not elsewhere classified  Other abnormalities of gait and mobility  Other disturbances of skin sensation  Rationale for Evaluation and Treatment: Rehabilitation  SUBJECTIVE:  SUBJECTIVE STATEMENT: Has not been here in 2 weeks due to lack of appointments. No pertinent update. Has continued with HEP but not much walking due to weather.    PERTINENT HISTORY: Patient is an 89 y.o. F with a PMH of osteopenia, arthritis, dysrhythmia, B ankle edema, GERD, hearing loss, HLD, hypothyroidism, migraine aura w/o HA, and neuropathy of B feet. Pt's surgical hx includes R cataract extraction (2017) and skin CA extracton (2019). Pt wishes to work on balance and receive exercises to help her improve strength. Pt reports several years ago she started out with tingling in feet and toes, but she is not diabetic and reports she does have sensation. She finds her balance if she is  walking and turns she finds herself "lurching off to the side, and unsteady". Reports she hasn't fallen recently last 6 months but has in years past. Pt walks in the woods with a walking stick but otherwise no AD. Pt reports independent with ADLs, drive, shopping.  PAIN:  Are you having pain? No  PRECAUTIONS: Fall  WEIGHT BEARING RESTRICTIONS: No  FALLS: Has patient fallen in last 6 months?  no  PATIENT GOALS: To improve balance and strength.   OBJECTIVE:   FUNCTIONAL TESTS:   EVAL:  5 times sit to stand: 11.82 no UE 6 minute walk test: 1200', mild-mod L veering throughout Functional gait assessment: 17/30  09/29/22: 5xSTS: 8.66sec hands free : 1356ft (8'24") FGA: 16 FOTO 54    PATIENT SURVEYS:  FOTO 57 ; 8/12: 54  TODAY'S TREATMENT:                                                                                                                              DATE: 09/29/22   Reassessment: measures  -forward eyes closed x29ft, yellow TB at knees  -backward eyes opened x34ft yellow TB at knees -forward eyes closed x73ft, yellow TB at knees  -backward eyes opened x80ft yellow TB at knees -AMB CW circlCe around chair yellow TB at knees -AMB CW circle around chair yellow TB at knees  PATIENT EDUCATION: Education details: Pt educated throughout session about proper posture and technique with exercises. Improved exercise technique, movement at target joints, use of target muscles after min to mod verbal, visual, tactile cues.  Person educated: Patient Education method: Explanation, Demonstration, and Verbal cues Education comprehension: verbalized understanding and needs further education  HOME EXERCISE PROGRAM: Access Code: [email or web address] URL: [email or web address] Date: 09/01/2022 Prepared by: Precious Bard Exercises - Single Leg Heel Raise with Counter Support  - 2 x daily - 7 x weekly - 2 sets - 10 reps - 5 hold - Arch Lifting  - 2 x daily - 7 x weekly - 2 sets -  10 reps - 5 hold - Seated Ankle Pumps  - 2 x daily - 7 x weekly - 2 sets - 10 reps - 5 hold - Seated Ankle Dorsiflexion with Resistance  - 2 x daily - 7  x weekly - 1 sets - 15 reps - 5 hold - Seated Ankle Plantar Flexion with Resistance Loop  - 2 x daily - 7 x weekly - 1 sets - 15 reps - 5 hold - Seated Ankle Eversion with Resistance  - 2 x daily - 7 x weekly - 1 sets - 15 reps - 5 hold - Seated Ankle Inversion with Resistance and Legs Crossed  - 2 x daily - 7 x weekly - 1 sets - 15 reps - 5 hold   GOALS: Goals reviewed with patient? Yes  SHORT TERM GOALS: Target date: 09/11/22  Patient will be independent in home exercise program to improve   strength/mobility for better functional independence with ADLs.  Baseline: To be prescribed. Goal status: INITIAL    LONG TERM GOALS: Target date: 10/09/2022   Patient will increase FOTO score to equal to or greater than   67  to demonstrate statistically significant improvement in mobility and quality of life.   Baseline: 57; 8/12: 54 Goal status: on going  2.  Pt will decrease 5TSTS by at least 3 seconds in order to demonstrate clinically significant improvement in LE strength  Baseline: 11.82 sec w/o BUE use; 09/29/22: 8.66sec Goal status: on going  3.  Pt will increase by at least 14m (170ft) in order to demonstrate clinically significant improvement in cardiopulmonary endurance and community ambulation without any demonstration of L veering.  Baseline: 08/14/22: 1200' with mid-mod L veering throughout; 09/29/22: 1355ft in 8'24" Goal status: ongoing  4.  Pt will increase B hip extension to >/= to 4/5 in order to improve hip extension during gait and mobility tasks. Baseline: 3-/5; 09/29/22: reassessment pending Goal status: on going  5.   Pt will increase B ankle DF and PF to 4+/5 Baseline: 4-/5; 09/29/22: reassessment pending Goal status: on going  6.  Pt will increase B hip flexor strength to >/= to 4/5 to assist in stair navigation,  walking, and all other mobility tasks.  Baseline: R: 4- / L: 3+; 09/29/22: reassessment pending Goal status: on going  7. Pt will increase B SLS to >10 sec with no UE support to indicate decrease risk of losing balance.   Baseline: 08/19/2022: R: 4 sec, L: 8 sec;   Goal status: on going  ASSESSMENT:  CLINICAL IMPRESSION: Reassessment today on visit 10. Pt reports feeling some progress overall. She remains grossly unsteady. Her outcome measures are largely unchanged since evaluation with exception to 5xSTS. Pt continues to demonstrate risk aversion behaviors during testing, fully aware of triggers for imbalance. Patient will benefit from skilled PT to address her strength, balance, mobility, and endurance to improve quality of life and maintain-progress PLOF.   OBJECTIVE IMPAIRMENTS: Abnormal gait, decreased activity tolerance, decreased balance, difficulty walking, decreased ROM, decreased strength, hypomobility, impaired flexibility, impaired sensation, impaired vision/preception, improper body mechanics, and impaired BLE proprioception R > L .   ACTIVITY LIMITATIONS: carrying, lifting, bending, squatting, sleeping, stairs, reach over head, and locomotion level  PARTICIPATION LIMITATIONS: meal prep, cleaning, laundry, yard work, and walking for exercise   PERSONAL FACTORS: Age, Behavior pattern, Past/current experiences, Sex, Time since onset of injury/illness/exacerbation, and 3+ comorbidities: osteopenia, HLD, arthritis, vision imapairment, and B neuropathy of feet  are also affecting patient's functional outcome.   REHAB POTENTIAL: Good  CLINICAL DECISION MAKING: Evolving/moderate complexity  EVALUATION COMPLEXITY: Moderate  PLAN:  PT FREQUENCY: 2x/week  PT DURATION: 8 weeks  PLANNED INTERVENTIONS: Therapeutic exercises, Therapeutic activity, Neuromuscular re-education, Balance training, Gait training,  Patient/Family education, Self Care, Joint mobilization, Stair training,  Vestibular training, Canalith repositioning, Visual/preceptual remediation/compensation, Orthotic/Fit training, Dry Needling, Cryotherapy, Moist heat, Compression bandaging, Splintting, Taping, Manual therapy, and Re-evaluation  PLAN FOR NEXT SESSION: remaining MMT and SLS assessmen tfor LT goals.   9:33 AM, 09/29/22 Rosamaria Lints, PT, DPT Physical Therapist - [facility]  Outpatient Physical Therapy- [facility] [phone]     Courtland C, PT 09/29/2022, 9:15 AM

## 2022-10-01 ENCOUNTER — Ambulatory Visit: Payer: Medicare Other

## 2022-10-01 DIAGNOSIS — R2689 Other abnormalities of gait and mobility: Secondary | ICD-10-CM

## 2022-10-01 DIAGNOSIS — R2681 Unsteadiness on feet: Secondary | ICD-10-CM

## 2022-10-01 DIAGNOSIS — R208 Other disturbances of skin sensation: Secondary | ICD-10-CM | POA: Diagnosis not present

## 2022-10-01 DIAGNOSIS — M6281 Muscle weakness (generalized): Secondary | ICD-10-CM | POA: Diagnosis not present

## 2022-10-01 DIAGNOSIS — R262 Difficulty in walking, not elsewhere classified: Secondary | ICD-10-CM | POA: Diagnosis not present

## 2022-10-01 NOTE — Therapy (Signed)
OUTPATIENT PHYSICAL THERAPY TREATMENT    Patient Name: Diane Tucker MRN: [id number] DOB:10-03-1933, 89 y.o., female Today's Date: 10/01/2022   PCP: Joaquim Nam, MD  REFERRING PROVIDER: Joaquim Nam, MD   END OF SESSION:  PT End of Session - 10/01/22 0848     Visit Number 11    Number of Visits 16    Date for PT Re-Evaluation 10/09/22    Progress Note Due on Visit 20    PT Start Time 0845    PT Stop Time 0924    PT Time Calculation (min) 39 min    Equipment Utilized During Treatment Gait belt    Activity Tolerance Patient tolerated treatment well;No increased pain    Behavior During Therapy WFL for tasks assessed/performed                      Past Medical History:  Diagnosis Date   Allergy    Arthritis    Dysrhythmia    Edema    ANKLES   GERD (gastroesophageal reflux disease)    HOH (hard of hearing)    Hyperlipidemia    Hypothyroidism    Migraine aura without headache (migraine equivalents)    Murmur    Neuropathy    FEET   Osteopenia    Thyroid disease    Hypothyroidism   Past Surgical History:  Procedure Laterality Date   CATARACT EXTRACTION W/PHACO Right 08/27/2015   Procedure: CATARACT EXTRACTION PHACO AND INTRAOCULAR LENS PLACEMENT (IOC);  Surgeon: Sallee Lange, MD;  Location: [facility] ORS;  Service: Ophthalmology;  Laterality: Right;  [place]  01:22 AP%24.0 CDE 34.56 fluid pack lot # [id number]   DOPPLER ECHOCARDIOGRAPHY  04/1994   MVP with MR (TR)   EYE SURGERY     Holter Monitor     NSR, freq. PVC's, rare colp, freq. PAC's   NSVD     X 2   Rheumatic Fever     (?) as a child. Hosp, prolonged fever for several days.   SKIN CANCER DESTRUCTION  06/2017   Dr. Roseanne Kaufman   TONSILLECTOMY     As a child   TOOTH EXTRACTION  02/2018   Patient Active Problem List   Diagnosis Date Noted   Balance disorder 07/30/2022   Abdominal bloating 07/30/2022   Abnormal x-ray 06/29/2022   Pelvic floor dysfunction 07/26/2021   Shoulder pain  12/30/2020   Paresthesia 12/30/2020   Advance care planning 07/18/2018   Healthcare maintenance 07/06/2015   Hereditary and idiopathic peripheral neuropathy 07/06/2015   Osteoarthritis of finger 06/29/2014   Vitamin D deficiency 06/29/2014   Caregiver burden 06/26/2013   Medicare annual wellness visit, subsequent 05/26/2012   Back pain 04/22/2011   ALLERGY 08/06/2006   Hypothyroidism 08/05/2006   HLD (hyperlipidemia) 08/05/2006   HEARING LOSS, HIGH FREQUENCY 08/05/2006   Elevated BP without diagnosis of hypertension 08/05/2006   H/O mitral valve prolapse 08/05/2006   Allergic rhinitis 08/05/2006   POSTMENOPAUSAL STATUS 08/05/2006   Osteopenia 08/05/2006   Palpitations 08/05/2006    ONSET DATE: 2 years  REFERRING DIAG: Balance Disorder  THERAPY DIAG:  Unsteadiness on feet  Muscle weakness (generalized)  Difficulty in walking, not elsewhere classified  Other abnormalities of gait and mobility  Other disturbances of skin sensation  Rationale for Evaluation and Treatment: Rehabilitation  SUBJECTIVE:  SUBJECTIVE STATEMENT: Patient reports doing well overall- feels like her ankles are getting stronger.    PERTINENT HISTORY: Patient is an 89 y.o. F with a PMH of osteopenia, arthritis, dysrhythmia, B ankle edema, GERD, hearing loss, HLD, hypothyroidism, migraine aura w/o HA, and neuropathy of B feet. Pt's surgical hx includes R cataract extraction (2017) and skin CA extracton (2019). Pt wishes to work on balance and receive exercises to help her improve strength. Pt reports several years ago she started out with tingling in feet and toes, but she is not diabetic and reports she does have sensation. She finds her balance if she is walking and turns she finds herself "lurching off to the side, and unsteady". Reports she hasn't fallen recently last 6 months  but has in years past. Pt walks in the woods with a walking stick but otherwise no AD. Pt reports independent with ADLs, drive, shopping.  PAIN:  Are you having pain? No  PRECAUTIONS: Fall  WEIGHT BEARING RESTRICTIONS: No  FALLS: Has patient fallen in last 6 months?  no  PATIENT GOALS: To improve balance and strength.   OBJECTIVE:   FUNCTIONAL TESTS:   EVAL:  5 times sit to stand: 11.82 no UE 6 minute walk test: 1200', mild-mod L veering throughout Functional gait assessment: 17/30  09/29/22: 5xSTS: 8.66sec hands free : 1384ft (8'24") FGA: 16 FOTO 54    PATIENT SURVEYS:  FOTO 57 ; 8/12: 54  TODAY'S TREATMENT:                                                                                                                              DATE: 10/01/22    NMR:   Dynamic high knee marching in // bars- down and back x 4 Dynamic side stepping (VC for wide step) x 3 down and back Dynamic resistive hip abd (Side stepping)  Sit to stand into calf raises x 12 reps (patient reported as tiring) - NO UE support [place]- in // bars x 5 without UE support (initial difficulty with coordination- improved with reps)  Dynamic hip march in place x 20 reps  Therex:  Step up in // bars with BUE Support with opp LE (high knee march) - 3# each LE x 12 reps Standing donkey kick 3# AW x 10 reps alt LE -I'm gonna feel that one"     PATIENT EDUCATION: Education details: Pt educated throughout session about proper posture and technique with exercises. Improved exercise technique, movement at target joints, use of target muscles after min to mod verbal, visual, tactile cues.  Person educated: Patient Education method: Explanation, Demonstration, and Verbal cues Education comprehension: verbalized understanding and needs further education  HOME EXERCISE PROGRAM: Access Code: [email or web address] URL: [email or web address] Date: 09/01/2022 Prepared by: Precious Bard Exercises -  Single Leg Heel Raise with Counter Support  - 2 x daily - 7 x weekly - 2 sets - 10 reps - 5 hold - Arch Lifting  - 2  x daily - 7 x weekly - 2 sets - 10 reps - 5 hold - Seated Ankle Pumps  - 2 x daily - 7 x weekly - 2 sets - 10 reps - 5 hold - Seated Ankle Dorsiflexion with Resistance  - 2 x daily - 7 x weekly - 1 sets - 15 reps - 5 hold - Seated Ankle Plantar Flexion with Resistance Loop  - 2 x daily - 7 x weekly - 1 sets - 15 reps - 5 hold - Seated Ankle Eversion with Resistance  - 2 x daily - 7 x weekly - 1 sets - 15 reps - 5 hold - Seated Ankle Inversion with Resistance and Legs Crossed  - 2 x daily - 7 x weekly - 1 sets - 15 reps - 5 hold   GOALS: Goals reviewed with patient? Yes  SHORT TERM GOALS: Target date: 09/11/22  Patient will be independent in home exercise program to improve   strength/mobility for better functional independence with ADLs.  Baseline: To be prescribed. Goal status: INITIAL    LONG TERM GOALS: Target date: 10/09/2022   Patient will increase FOTO score to equal to or greater than   67  to demonstrate statistically significant improvement in mobility and quality of life.   Baseline: 57; 8/12: 54 Goal status: on going  2.  Pt will decrease 5TSTS by at least 3 seconds in order to demonstrate clinically significant improvement in LE strength  Baseline: 11.82 sec w/o BUE use; 09/29/22: 8.66sec Goal status: on going  3.  Pt will increase by at least 65m (198ft) in order to demonstrate clinically significant improvement in cardiopulmonary endurance and community ambulation without any demonstration of L veering.  Baseline: 08/14/22: 1200' with mid-mod L veering throughout; 09/29/22: 1322ft in 8'24" Goal status: ongoing  4.  Pt will increase B hip extension to >/= to 4/5 in order to improve hip extension during gait and mobility tasks. Baseline: 3-/5; 09/29/22: reassessment pending Goal status: on going  5.   Pt will increase B ankle DF and PF to 4+/5 Baseline:  4-/5; 09/29/22: reassessment pending Goal status: on going  6.  Pt will increase B hip flexor strength to >/= to 4/5 to assist in stair navigation, walking, and all other mobility tasks.  Baseline: R: 4- / L: 3+; 09/29/22: reassessment pending Goal status: on going  7. Pt will increase B SLS to >10 sec with no UE support to indicate decrease risk of losing balance.   Baseline: 08/19/2022: R: 4 sec, L: 8 sec;   Goal status: on going  ASSESSMENT:  CLINICAL IMPRESSION: Patient presents today with good motivation for focus on LE strengthening. She was challenged with resistance today and with new Hip strengthening exercises. She did adapt well and improved overall with practice today.  Patient will benefit from skilled PT to address her strength, balance, mobility, and endurance to improve quality of life and maintain-progress PLOF.   OBJECTIVE IMPAIRMENTS: Abnormal gait, decreased activity tolerance, decreased balance, difficulty walking, decreased ROM, decreased strength, hypomobility, impaired flexibility, impaired sensation, impaired vision/preception, improper body mechanics, and impaired BLE proprioception R > L .   ACTIVITY LIMITATIONS: carrying, lifting, bending, squatting, sleeping, stairs, reach over head, and locomotion level  PARTICIPATION LIMITATIONS: meal prep, cleaning, laundry, yard work, and walking for exercise   PERSONAL FACTORS: Age, Behavior pattern, Past/current experiences, Sex, Time since onset of injury/illness/exacerbation, and 3+ comorbidities: osteopenia, HLD, arthritis, vision imapairment, and B neuropathy of feet  are also affecting  patient's functional outcome.   REHAB POTENTIAL: Good  CLINICAL DECISION MAKING: Evolving/moderate complexity  EVALUATION COMPLEXITY: Moderate  PLAN:  PT FREQUENCY: 2x/week  PT DURATION: 8 weeks  PLANNED INTERVENTIONS: Therapeutic exercises, Therapeutic activity, Neuromuscular re-education, Balance training, Gait training,  Patient/Family education, Self Care, Joint mobilization, Stair training, Vestibular training, Canalith repositioning, Visual/preceptual remediation/compensation, Orthotic/Fit training, Dry Needling, Cryotherapy, Moist heat, Compression bandaging, Splintting, Taping, Manual therapy, and Re-evaluation  PLAN FOR NEXT SESSION: Continue to progress overall LE Strength and dynamic balance. Recert vs. Potential discharge next visit.    9:27 AM, 10/01/22 Louis Meckel, PTPhysical Therapist - [place] [facility]  Outpatient Physical Therapy- [facility] [phone]     Lenda Kelp, PT 10/01/2022, 9:27 AM

## 2022-10-06 NOTE — Therapy (Signed)
OUTPATIENT PHYSICAL THERAPY TREATMENT/RECERT    Patient Name: Diane Tucker MRN: [id number] DOB:[birth year], 89 y.o., female Today's Date: 10/06/2022   PCP: Joaquim Nam, MD  REFERRING PROVIDER: Joaquim Nam, MD   END OF SESSION:             Past Medical History:  Diagnosis Date   Allergy    Arthritis    Dysrhythmia    Edema    ANKLES   GERD (gastroesophageal reflux disease)    HOH (hard of hearing)    Hyperlipidemia    Hypothyroidism    Migraine aura without headache (migraine equivalents)    Murmur    Neuropathy    FEET   Osteopenia    Thyroid disease    Hypothyroidism   Past Surgical History:  Procedure Laterality Date   CATARACT EXTRACTION W/PHACO Right 08/27/2015   Procedure: CATARACT EXTRACTION PHACO AND INTRAOCULAR LENS PLACEMENT (IOC);  Surgeon: Sallee Lange, MD;  Location: [facility] ORS;  Service: Ophthalmology;  Laterality: Right;  [place]  01:22 AP%24.0 CDE 34.56 fluid pack lot # [id number] H   DOPPLER ECHOCARDIOGRAPHY  04/1994   MVP with MR (TR)   EYE SURGERY     Holter Monitor     NSR, freq. PVC's, rare colp, freq. PAC's   NSVD     X 2   Rheumatic Fever     (?) as a child. Hosp, prolonged fever for several days.   SKIN CANCER DESTRUCTION  06/2017   Dr. Roseanne Kaufman   TONSILLECTOMY     As a child   TOOTH EXTRACTION  02/2018   Patient Active Problem List   Diagnosis Date Noted   Balance disorder 07/30/2022   Abdominal bloating 07/30/2022   Abnormal x-ray 06/29/2022   Pelvic floor dysfunction 07/26/2021   Shoulder pain 12/30/2020   Paresthesia 12/30/2020   Advance care planning 07/18/2018   Healthcare maintenance 07/06/2015   Hereditary and idiopathic peripheral neuropathy 07/06/2015   Osteoarthritis of finger 06/29/2014   Vitamin D deficiency 06/29/2014   Caregiver burden 06/26/2013   Medicare annual wellness visit, subsequent 05/26/2012   Back pain 04/22/2011   ALLERGY 08/06/2006   Hypothyroidism 08/05/2006   HLD  (hyperlipidemia) 08/05/2006   HEARING LOSS, HIGH FREQUENCY 08/05/2006   Elevated BP without diagnosis of hypertension 08/05/2006   H/O mitral valve prolapse 08/05/2006   Allergic rhinitis 08/05/2006   POSTMENOPAUSAL STATUS 08/05/2006   Osteopenia 08/05/2006   Palpitations 08/05/2006    ONSET DATE: 2 years  REFERRING DIAG: Balance Disorder  THERAPY DIAG:  No diagnosis found.  Rationale for Evaluation and Treatment: Rehabilitation  SUBJECTIVE:                                                                                                                                  SUBJECTIVE STATEMENT: ***   PERTINENT HISTORY: Patient is an 89 y.o. F with a PMH of osteopenia, arthritis, dysrhythmia, B ankle edema, GERD, hearing loss,  HLD, hypothyroidism, migraine aura w/o HA, and neuropathy of B feet. Pt's surgical hx includes R cataract extraction (2017) and skin CA extracton (2019). Pt wishes to work on balance and receive exercises to help her improve strength. Pt reports several years ago she started out with tingling in feet and toes, but she is not diabetic and reports she does have sensation. She finds her balance if she is walking and turns she finds herself "lurching off to the side, and unsteady". Reports she hasn't fallen recently last 6 months but has in years past. Pt walks in the woods with a walking stick but otherwise no AD. Pt reports independent with ADLs, drive, shopping.  PAIN:  Are you having pain? No  PRECAUTIONS: Fall  WEIGHT BEARING RESTRICTIONS: No  FALLS: Has patient fallen in last 6 months?  no  PATIENT GOALS: To improve balance and strength.   OBJECTIVE:   FUNCTIONAL TESTS:   EVAL:  5 times sit to stand: 11.82 no UE 6 minute walk test: 1200', mild-mod L veering throughout Functional gait assessment: 17/30  09/29/22: 5xSTS: 8.66sec hands free : 133ft (8'24") FGA: 16 FOTO 54    PATIENT SURVEYS:  FOTO 57 ; 8/12: 54  TODAY'S TREATMENT:                                                                                                                               DATE: 10/06/22   Goals performed: see below for details  NMR:   Dynamic high knee marching in // bars- down and back x 4 Dynamic side stepping (VC for wide step) x 3 down and back Dynamic resistive hip abd (Side stepping)  Sit to stand into calf raises x 12 reps (patient reported as tiring) - NO UE support [place]- in // bars x 5 without UE support (initial difficulty with coordination- improved with reps)  Dynamic hip march in place x 20 reps  Therex:  Step up in // bars with BUE Support with opp LE (high knee march) - 3# each LE x 12 reps Standing donkey kick 3# AW x 10 reps alt LE -I'm gonna feel that one"     PATIENT EDUCATION: Education details: Pt educated throughout session about proper posture and technique with exercises. Improved exercise technique, movement at target joints, use of target muscles after min to mod verbal, visual, tactile cues.  Person educated: Patient Education method: Explanation, Demonstration, and Verbal cues Education comprehension: verbalized understanding and needs further education  HOME EXERCISE PROGRAM: Access Code: [email or web address] URL: [email or web address] Date: 09/01/2022 Prepared by: Precious Bard Exercises - Single Leg Heel Raise with Counter Support  - 2 x daily - 7 x weekly - 2 sets - 10 reps - 5 hold - Arch Lifting  - 2 x daily - 7 x weekly - 2 sets - 10 reps - 5 hold - Seated Ankle Pumps  - 2 x daily - 7 x weekly - 2 sets - 10  reps - 5 hold - Seated Ankle Dorsiflexion with Resistance  - 2 x daily - 7 x weekly - 1 sets - 15 reps - 5 hold - Seated Ankle Plantar Flexion with Resistance Loop  - 2 x daily - 7 x weekly - 1 sets - 15 reps - 5 hold - Seated Ankle Eversion with Resistance  - 2 x daily - 7 x weekly - 1 sets - 15 reps - 5 hold - Seated Ankle Inversion with Resistance and Legs Crossed  - 2 x daily - 7 x weekly  - 1 sets - 15 reps - 5 hold   GOALS: Goals reviewed with patient? Yes  SHORT TERM GOALS: Target date: 09/11/22  Patient will be independent in home exercise program to improve   strength/mobility for better functional independence with ADLs.  Baseline: To be prescribed. Goal status: INITIAL    LONG TERM GOALS: Target date: 10/09/2022   Patient will increase FOTO score to equal to or greater than   67  to demonstrate statistically significant improvement in mobility and quality of life.   Baseline: 57; 8/12: 54 Goal status: on going  2.  Pt will decrease 5TSTS by at least 3 seconds in order to demonstrate clinically significant improvement in LE strength  Baseline: 11.82 sec w/o BUE use; 09/29/22: 8.66sec Goal status: on going  3.  Pt will increase by at least 85m (153ft) in order to demonstrate clinically significant improvement in cardiopulmonary endurance and community ambulation without any demonstration of L veering.  Baseline: 08/14/22: 1200' with mid-mod L veering throughout; 09/29/22: 1333ft in 8'24" Goal status: ongoing  4.  Pt will increase B hip extension to >/= to 4/5 in order to improve hip extension during gait and mobility tasks. Baseline: 3-/5; 09/29/22: reassessment pending Goal status: on going  5.   Pt will increase B ankle DF and PF to 4+/5 Baseline: 4-/5; 09/29/22: reassessment pending Goal status: on going  6.  Pt will increase B hip flexor strength to >/= to 4/5 to assist in stair navigation, walking, and all other mobility tasks.  Baseline: R: 4- / L: 3+; 09/29/22: reassessment pending Goal status: on going  7. Pt will increase B SLS to >10 sec with no UE support to indicate decrease risk of losing balance.   Baseline: 08/19/2022: R: 4 sec, L: 8 sec;   Goal status: on going  ASSESSMENT:  CLINICAL IMPRESSION: ***  Patient will benefit from skilled PT to address her strength, balance, mobility, and endurance to improve quality of life and  maintain-progress PLOF.   OBJECTIVE IMPAIRMENTS: Abnormal gait, decreased activity tolerance, decreased balance, difficulty walking, decreased ROM, decreased strength, hypomobility, impaired flexibility, impaired sensation, impaired vision/preception, improper body mechanics, and impaired BLE proprioception R > L .   ACTIVITY LIMITATIONS: carrying, lifting, bending, squatting, sleeping, stairs, reach over head, and locomotion level  PARTICIPATION LIMITATIONS: meal prep, cleaning, laundry, yard work, and walking for exercise   PERSONAL FACTORS: Age, Behavior pattern, Past/current experiences, Sex, Time since onset of injury/illness/exacerbation, and 3+ comorbidities: osteopenia, HLD, arthritis, vision imapairment, and B neuropathy of feet  are also affecting patient's functional outcome.   REHAB POTENTIAL: Good  CLINICAL DECISION MAKING: Evolving/moderate complexity  EVALUATION COMPLEXITY: Moderate  PLAN:  PT FREQUENCY: 2x/week  PT DURATION: 8 weeks  PLANNED INTERVENTIONS: Therapeutic exercises, Therapeutic activity, Neuromuscular re-education, Balance training, Gait training, Patient/Family education, Self Care, Joint mobilization, Stair training, Vestibular training, Canalith repositioning, Visual/preceptual remediation/compensation, Orthotic/Fit training, Dry Needling, Cryotherapy, Moist heat, Compression  bandaging, Splintting, Taping, Manual therapy, and Re-evaluation  PLAN FOR NEXT SESSION: Continue to progress overall LE Strength and dynamic balance. Recert vs. Potential discharge next visit.    12:10 PM, 10/06/22  [facility]  Outpatient Physical Therapy- [facility] [phone]     Precious Bard, PT 10/06/2022, 12:10 PM

## 2022-10-07 ENCOUNTER — Ambulatory Visit: Payer: Medicare Other

## 2022-10-07 DIAGNOSIS — M6281 Muscle weakness (generalized): Secondary | ICD-10-CM | POA: Diagnosis not present

## 2022-10-07 DIAGNOSIS — R2681 Unsteadiness on feet: Secondary | ICD-10-CM

## 2022-10-07 DIAGNOSIS — R208 Other disturbances of skin sensation: Secondary | ICD-10-CM | POA: Diagnosis not present

## 2022-10-07 DIAGNOSIS — R2689 Other abnormalities of gait and mobility: Secondary | ICD-10-CM | POA: Diagnosis not present

## 2022-10-07 DIAGNOSIS — R262 Difficulty in walking, not elsewhere classified: Secondary | ICD-10-CM | POA: Diagnosis not present

## 2022-10-13 ENCOUNTER — Ambulatory Visit: Payer: Medicare Other

## 2022-10-16 ENCOUNTER — Ambulatory Visit: Payer: TRICARE For Life (TFL)

## 2022-10-22 ENCOUNTER — Ambulatory Visit: Payer: TRICARE For Life (TFL)

## 2022-10-23 ENCOUNTER — Ambulatory Visit
Admission: RE | Admit: 2022-10-23 | Discharge: 2022-10-23 | Disposition: A | Discharge status: Home / Self Care | Payer: Medicare Other | Source: Ambulatory Visit

## 2022-10-23 DIAGNOSIS — Z1231 Encounter for screening mammogram for malignant neoplasm of breast: Secondary | ICD-10-CM | POA: Diagnosis not present

## 2022-10-27 ENCOUNTER — Ambulatory Visit: Payer: TRICARE For Life (TFL)

## 2022-10-29 ENCOUNTER — Ambulatory Visit: Payer: TRICARE For Life (TFL)

## 2022-11-03 ENCOUNTER — Ambulatory Visit: Payer: TRICARE For Life (TFL)

## 2022-11-05 ENCOUNTER — Ambulatory Visit: Payer: TRICARE For Life (TFL)

## 2022-11-05 DIAGNOSIS — Z961 Presence of intraocular lens: Secondary | ICD-10-CM | POA: Diagnosis not present

## 2022-11-05 DIAGNOSIS — H26491 Other secondary cataract, right eye: Secondary | ICD-10-CM | POA: Diagnosis not present

## 2022-11-10 ENCOUNTER — Ambulatory Visit: Payer: TRICARE For Life (TFL)

## 2022-11-11 DIAGNOSIS — Z23 Encounter for immunization: Secondary | ICD-10-CM | POA: Diagnosis not present

## 2022-11-12 ENCOUNTER — Ambulatory Visit: Payer: TRICARE For Life (TFL)

## 2022-11-17 DIAGNOSIS — Z23 Encounter for immunization: Secondary | ICD-10-CM | POA: Diagnosis not present

## 2022-11-24 ENCOUNTER — Ambulatory Visit: Payer: TRICARE For Life (TFL)

## 2022-11-26 ENCOUNTER — Ambulatory Visit: Payer: TRICARE For Life (TFL)

## 2022-12-03 ENCOUNTER — Ambulatory Visit: Payer: TRICARE For Life (TFL)

## 2022-12-08 ENCOUNTER — Ambulatory Visit: Payer: TRICARE For Life (TFL)

## 2022-12-10 ENCOUNTER — Ambulatory Visit: Payer: TRICARE For Life (TFL)

## 2022-12-15 ENCOUNTER — Ambulatory Visit: Payer: TRICARE For Life (TFL)

## 2022-12-17 ENCOUNTER — Ambulatory Visit: Payer: TRICARE For Life (TFL)

## 2022-12-22 ENCOUNTER — Ambulatory Visit: Payer: TRICARE For Life (TFL)

## 2022-12-24 ENCOUNTER — Ambulatory Visit: Payer: TRICARE For Life (TFL)

## 2022-12-29 ENCOUNTER — Ambulatory Visit: Payer: TRICARE For Life (TFL)

## 2022-12-31 ENCOUNTER — Ambulatory Visit: Payer: TRICARE For Life (TFL)

## 2023-01-01 ENCOUNTER — Encounter: Payer: Self-pay | Admitting: Family Medicine

## 2023-01-05 ENCOUNTER — Ambulatory Visit: Payer: TRICARE For Life (TFL)

## 2023-01-07 ENCOUNTER — Ambulatory Visit: Payer: TRICARE For Life (TFL)

## 2023-01-12 ENCOUNTER — Ambulatory Visit: Payer: TRICARE For Life (TFL)

## 2023-01-14 ENCOUNTER — Ambulatory Visit: Payer: TRICARE For Life (TFL)

## 2023-01-22 ENCOUNTER — Ambulatory Visit: Payer: TRICARE For Life (TFL) | Admitting: Family Medicine

## 2023-01-23 ENCOUNTER — Ambulatory Visit (INDEPENDENT_AMBULATORY_CARE_PROVIDER_SITE_OTHER): Payer: Medicare Other | Admitting: Family Medicine

## 2023-01-23 ENCOUNTER — Encounter: Payer: Self-pay | Admitting: Family Medicine

## 2023-01-23 VITALS — BP 144/80 | HR 69 | Temp 97.6°F | Ht 62.0 in | Wt 149.0 lb

## 2023-01-23 DIAGNOSIS — Z711 Person with feared health complaint in whom no diagnosis is made: Secondary | ICD-10-CM | POA: Diagnosis not present

## 2023-01-23 DIAGNOSIS — M8580 Other specified disorders of bone density and structure, unspecified site: Secondary | ICD-10-CM

## 2023-01-23 MED ORDER — ALENDRONATE SODIUM 10 MG PO TABS: ORAL_TABLET | ORAL | Status: DC

## 2023-01-23 NOTE — Progress Notes (Unsigned)
Follow up memory.  No red flag events.  Not getting lost. Still managing her bills.  Some slowing of recall with facts and names.    D/w pt about dental eval.  Held alendronate in the meantime.  D/w pt about holding until work done and healed from dental work.

## 2023-01-23 NOTE — Patient Instructions (Signed)
Hold alendronate for now.  Restart when healed.   I would keep going as is otherwise.  Take care.  Glad to see you.

## 2023-01-25 DIAGNOSIS — Z711 Person with feared health complaint in whom no diagnosis is made: Secondary | ICD-10-CM | POA: Insufficient documentation

## 2023-01-25 NOTE — Assessment & Plan Note (Signed)
Concern noted by patient.  She has some slowing of recall but no red flag events.  She has reassuring MMSE today.  Discussed.  I do not suspect an ominous issue currently.  MMSE 29/30.  -1 for recall of the day, she thought it was December 8 instead of December 6.  She is trying to stay active and reading/doing puzzles.  I think it makes sense to continue as is and we can recheck her memory periodically, at a yearly visit in the summer 2025.  She can update me sooner if needed.

## 2023-01-25 NOTE — Assessment & Plan Note (Signed)
She can restart Fosamax when she is healed from her dental work.

## 2023-04-02 ENCOUNTER — Ambulatory Visit: Payer: Medicare Other | Admitting: Family Medicine

## 2023-04-02 ENCOUNTER — Ambulatory Visit
Admission: RE | Admit: 2023-04-02 | Discharge: 2023-04-02 | Disposition: A | Discharge status: Home / Self Care | Payer: Medicare Other | Source: Ambulatory Visit | Attending: Family Medicine | Admitting: Family Medicine

## 2023-04-02 VITALS — BP 144/74 | HR 72 | Temp 98.1°F | Ht 62.0 in | Wt 148.6 lb

## 2023-04-02 DIAGNOSIS — M79671 Pain in right foot: Secondary | ICD-10-CM | POA: Diagnosis not present

## 2023-04-02 DIAGNOSIS — M19071 Primary osteoarthritis, right ankle and foot: Secondary | ICD-10-CM | POA: Diagnosis not present

## 2023-04-02 DIAGNOSIS — M79673 Pain in unspecified foot: Secondary | ICD-10-CM

## 2023-04-02 DIAGNOSIS — S92351D Displaced fracture of fifth metatarsal bone, right foot, subsequent encounter for fracture with routine healing: Secondary | ICD-10-CM | POA: Diagnosis not present

## 2023-04-02 DIAGNOSIS — M2011 Hallux valgus (acquired), right foot: Secondary | ICD-10-CM | POA: Diagnosis not present

## 2023-04-02 DIAGNOSIS — M79674 Pain in right toe(s): Secondary | ICD-10-CM | POA: Diagnosis not present

## 2023-04-02 NOTE — Progress Notes (Signed)
Right big toe swollen and painful. Occurred about 10 days ago, she fell.  She was getting up laundry, turned and fell.  No syncope.  Lost her balance.  Fell forward on hands and knees.  Then realized her R1st toe was sore.  It was puffy and bruised.  Slowly getting better but still sore with ROM.    She is off fosamax in the meantime, she is deciding about dental intervention.   She has altered sensation on the feet at baseline.    Nad Ncat R foot with normal inspection except for bruising proximal to the R 1st nail.   Foot not ttp except at the 1st MTP.   Prox phalanx fx and arthritic changes noted on x-ray, discussed with patient at office visit.

## 2023-04-02 NOTE — Patient Instructions (Signed)
I would try using a post op shoe in the meantime.  Update me as needed.  Take care.  Glad to see you.

## 2023-04-05 DIAGNOSIS — M79673 Pain in unspecified foot: Secondary | ICD-10-CM | POA: Insufficient documentation

## 2023-04-05 NOTE — Assessment & Plan Note (Signed)
Prox phalanx fx and arthritic changes noted on x-ray, discussed with patient at office visit.  Given the timeline and exam, I would try using a post op shoe in the meantime.  She understood and she can update me as needed.

## 2023-06-04 DIAGNOSIS — C44629 Squamous cell carcinoma of skin of left upper limb, including shoulder: Secondary | ICD-10-CM | POA: Diagnosis not present

## 2023-06-04 DIAGNOSIS — D2262 Melanocytic nevi of left upper limb, including shoulder: Secondary | ICD-10-CM | POA: Diagnosis not present

## 2023-06-04 DIAGNOSIS — D2272 Melanocytic nevi of left lower limb, including hip: Secondary | ICD-10-CM | POA: Diagnosis not present

## 2023-06-04 DIAGNOSIS — D225 Melanocytic nevi of trunk: Secondary | ICD-10-CM | POA: Diagnosis not present

## 2023-06-04 DIAGNOSIS — L708 Other acne: Secondary | ICD-10-CM | POA: Diagnosis not present

## 2023-06-04 DIAGNOSIS — D485 Neoplasm of uncertain behavior of skin: Secondary | ICD-10-CM | POA: Diagnosis not present

## 2023-06-04 DIAGNOSIS — D2271 Melanocytic nevi of right lower limb, including hip: Secondary | ICD-10-CM | POA: Diagnosis not present

## 2023-06-04 DIAGNOSIS — D2261 Melanocytic nevi of right upper limb, including shoulder: Secondary | ICD-10-CM | POA: Diagnosis not present

## 2023-06-04 DIAGNOSIS — Z09 Encounter for follow-up examination after completed treatment for conditions other than malignant neoplasm: Secondary | ICD-10-CM | POA: Diagnosis not present

## 2023-06-04 DIAGNOSIS — Z872 Personal history of diseases of the skin and subcutaneous tissue: Secondary | ICD-10-CM | POA: Diagnosis not present

## 2023-06-04 DIAGNOSIS — Z85828 Personal history of other malignant neoplasm of skin: Secondary | ICD-10-CM | POA: Diagnosis not present

## 2023-06-04 DIAGNOSIS — L821 Other seborrheic keratosis: Secondary | ICD-10-CM | POA: Diagnosis not present

## 2023-06-18 ENCOUNTER — Other Ambulatory Visit: Payer: Self-pay | Admitting: Family Medicine

## 2023-07-09 DIAGNOSIS — H903 Sensorineural hearing loss, bilateral: Secondary | ICD-10-CM | POA: Diagnosis not present

## 2023-07-24 ENCOUNTER — Telehealth: Payer: Self-pay

## 2023-07-24 NOTE — Telephone Encounter (Signed)
 Copied from CRM [id number]. Topic: Clinical - Request for Lab/Test Order >> Jul 24, 2023 10:50 AM Marlan Silva wrote: Reason for CRM: Patient is requesting Dr. Vallarie Gauze input lab orders so she can come in before her appointment to get her labs drawn. Patient is requesting a call back once the orders are placed so that way she can schedule her labs appointment. Patient can be reached at the number on file [phone].

## 2023-07-24 NOTE — Telephone Encounter (Signed)
 Scheduled patient lab appt prior to physical. Per our office protocol labs will be ordered before the appt, lab will notify provider.

## 2023-07-27 ENCOUNTER — Other Ambulatory Visit: Payer: Self-pay | Admitting: Family Medicine

## 2023-07-27 DIAGNOSIS — E039 Hypothyroidism, unspecified: Secondary | ICD-10-CM

## 2023-07-27 DIAGNOSIS — E559 Vitamin D deficiency, unspecified: Secondary | ICD-10-CM

## 2023-07-27 DIAGNOSIS — M8580 Other specified disorders of bone density and structure, unspecified site: Secondary | ICD-10-CM

## 2023-07-27 DIAGNOSIS — E785 Hyperlipidemia, unspecified: Secondary | ICD-10-CM

## 2023-07-27 NOTE — Telephone Encounter (Signed)
 Pt called and schedule appt labs

## 2023-07-28 ENCOUNTER — Other Ambulatory Visit (INDEPENDENT_AMBULATORY_CARE_PROVIDER_SITE_OTHER)

## 2023-07-28 DIAGNOSIS — E039 Hypothyroidism, unspecified: Secondary | ICD-10-CM

## 2023-07-28 DIAGNOSIS — M8580 Other specified disorders of bone density and structure, unspecified site: Secondary | ICD-10-CM | POA: Diagnosis not present

## 2023-07-28 DIAGNOSIS — E785 Hyperlipidemia, unspecified: Secondary | ICD-10-CM

## 2023-07-28 DIAGNOSIS — E559 Vitamin D deficiency, unspecified: Secondary | ICD-10-CM | POA: Diagnosis not present

## 2023-07-30 ENCOUNTER — Ambulatory Visit (INDEPENDENT_AMBULATORY_CARE_PROVIDER_SITE_OTHER)

## 2023-07-30 VITALS — Ht 62.0 in | Wt 148.0 lb

## 2023-07-30 DIAGNOSIS — Z0000 Encounter for general adult medical examination without abnormal findings: Secondary | ICD-10-CM | POA: Diagnosis not present

## 2023-07-30 NOTE — Progress Notes (Signed)
 Subjective:   Diane Tucker is a 89 y.o. who presents for a Medicare Wellness preventive visit.  As a reminder, Annual Wellness Visits don't include a physical exam, and some assessments may be limited, especially if this visit is performed virtually. We may recommend an in-person follow-up visit with your provider if needed.  Visit Complete: Virtual I connected with  Diane Tucker on 07/30/23 by a audio enabled telemedicine application and verified that I am speaking with the correct person using two identifiers.  Patient Location: Home  Provider Location: Office/Clinic  I discussed the limitations of evaluation and management by telemedicine. The patient expressed understanding and agreed to proceed.  Vital Signs: Because this visit was a virtual/telehealth visit, some criteria may be missing or patient reported. Any vitals not documented were not able to be obtained and vitals that have been documented are patient reported.  VideoDeclined- This patient declined Librarian, academic. Therefore the visit was completed with audio only.  Persons Participating in Visit: Patient.  AWV Questionnaire: Yes: Patient Medicare AWV questionnaire was completed by the patient on 07/29/23; I have confirmed that all information answered by patient is correct and no changes since this date.  Cardiac Risk Factors include: advanced age (>1men, >66 women);dyslipidemia;sedentary lifestyle     Objective:    Today's Vitals   07/30/23 1139  Weight: 148 lb (67.1 kg)  Height: 5' 2 (1.575 m)   Body mass index is 27.07 kg/m.     07/30/2023   11:51 AM 08/14/2022   10:58 AM 08/09/2020    2:02 PM 07/15/2018   11:03 AM 07/09/2017    8:18 AM 06/30/2016    1:56 PM 06/28/2015    2:19 PM  Advanced Directives  Does Patient Have a Medical Advance Directive? Yes Yes Yes Yes Yes  Yes  Yes   Type of Estate agent of [place];Living will Living will;Healthcare  Power of [employer] Power of [place];Living will Healthcare Power of [place];Living will Healthcare Power of [place];Living will Healthcare Power of [place];Living will Healthcare Power of [place];Living will   Does patient want to make changes to medical advance directive?       No - Patient declined   Copy of Healthcare Power of Attorney in Chart? Yes - validated most recent copy scanned in chart (See row information)  Yes - validated most recent copy scanned in chart (See row information) Yes - validated most recent copy scanned in chart (See row information)  Yes  No - copy requested  No - copy requested      Data saved with a previous flowsheet row definition    Current Medications (verified) Outpatient Encounter Medications as of 07/30/2023  Medication Sig   atorvastatin  (LIPITOR) 10 MG tablet TAKE 1 TABLET DAILY   calcium  carbonate (TUMS - DOSED IN MG ELEMENTAL CALCIUM ) 500 MG chewable tablet Chew 1 tablet by mouth as needed for indigestion or heartburn.   Cholecalciferol (VITAMIN D3) 1000 units CAPS Take 2 capsules by mouth daily.    Coenzyme Q10 (COQ10) 200 MG CAPS Take 1 capsule by mouth daily.   Cyanocobalamin  (VITAMIN B 12) 500 MCG TABS daily at 6 (six) AM.   hydroxypropyl methylcellulose (ISOPTO TEARS) 2.5 % ophthalmic solution 1 drop 3 (three) times daily as needed.   Multiple Vitamin (MULTIVITAMIN) tablet Take 1 tablet by mouth daily.   naproxen sodium (ALEVE) 220 MG tablet Take 220 mg by mouth daily as needed.   Oxymetazoline HCl (AFRIN NASAL SPRAY  NA) Place into the nose.   simethicone (MYLICON) 125 MG chewable tablet Chew 125 mg by mouth every 6 (six) hours as needed for flatulence.   sodium chloride  (OCEAN) 0.65 % nasal spray Place 1 spray into the nose as needed.   SYNTHROID  100 MCG tablet TAKE 1 TABLET DAILY   alendronate  (FOSAMAX ) 10 MG tablet Held as of 01/23/23 (Patient not taking: Reported on 07/30/2023)   No facility-administered encounter medications on  file as of 07/30/2023.    Allergies (verified) Clindamycin and Metoprolol    History: Past Medical History:  Diagnosis Date   Allergy    Arthritis    Cataract    Dysrhythmia    Edema    ANKLES   GERD (gastroesophageal reflux disease)    HOH (hard of hearing)    Hyperlipidemia    Hypertension 1990   Hypothyroidism    Migraine aura without headache (migraine equivalents)    Murmur    Neuropathy    FEET   Osteopenia    Osteoporosis    Thyroid  disease    Hypothyroidism   Past Surgical History:  Procedure Laterality Date   CATARACT EXTRACTION W/PHACO Right 08/27/2015   Procedure: CATARACT EXTRACTION PHACO AND INTRAOCULAR LENS PLACEMENT (IOC);  Surgeon: Steven Dingeldein, MD;  Location: [facility] ORS;  Service: Ophthalmology;  Laterality: Right;  US   01:22 AP%24.0 CDE 34.56 fluid pack lot # [id number]   DOPPLER ECHOCARDIOGRAPHY  04/1994   MVP with MR (TR)   EYE SURGERY     Holter Monitor     NSR, freq. PVC's, rare colp, freq. PAC's   NSVD     X 2   Rheumatic Fever     (?) as a child. Hosp, prolonged fever for several days.   SKIN CANCER DESTRUCTION  06/2017   Dr. Lehman Pummel   TONSILLECTOMY     As a child   TOOTH EXTRACTION  02/2018   Family History  Problem Relation Age of Onset   Hypertension Mother    Heart disease Mother        CHF, MI   Arthritis Mother    Hearing loss Mother    Stroke Father    Heart disease Father        CHF   Hypertension Father    Heart disease Paternal Grandmother        MI   Obesity Paternal Grandmother    Cystic fibrosis Other        3 died as infants   Cancer Paternal Aunt 24       Breast   Breast cancer Paternal Aunt    Stroke Maternal Grandmother 65   Cancer Maternal Grandfather        Lung (smoker)   Hypertension Other    Early death Maternal Aunt    Early death Sister    Colon cancer Neg Hx    Social History   Socioeconomic History   Marital status: Married    Spouse name: Not on file   Number of children: 2   Years  of education: Not on file   Highest education level: Master's degree (e.g., MA, MS, MEng, MEd, MSW, MBA)  Occupational History   Occupation: TEFL teacher in past    Employer: RETIRED   Occupation: Income Tax, part-time  Tobacco Use   Smoking status: Never   Smokeless tobacco: Never  Vaping Use   Vaping status: Never Used  Substance and Sexual Activity   Alcohol use: Yes    Alcohol/week: 6.0 standard drinks of  alcohol    Types: 6 Glasses of wine per week    Comment: 1 small glass of wine most days   Drug use: Never   Sexual activity: Not Currently    Birth control/protection: Post-menopausal, None  Other Topics Concern   Not on file  Social History Narrative   Married 1964, lives with husband, 2 adult children (one may have schizophrenia)   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, retired from tax work   Enjoys Pharmacist, community   Social Drivers of Corporate investment banker Strain: Low Risk  (07/30/2023)   Overall Financial Resource Strain (CARDIA)    Difficulty of Paying Living Expenses: Not hard at all  Food Insecurity: No Food Insecurity (07/30/2023)   Hunger Vital Sign    Worried About Running Out of Food in the Last Year: Never true    Ran Out of Food in the Last Year: Never true  Transportation Needs: No Transportation Needs (07/30/2023)   PRAPARE - Administrator, Civil Service (Medical): No    Lack of Transportation (Non-Medical): No  Physical Activity: Inactive (07/30/2023)   Exercise Vital Sign    Days of Exercise per Week: 0 days    Minutes of Exercise per Session: 0 min  Stress: No Stress Concern Present (07/30/2023)   [employer] of Occupational Health - Occupational Stress Questionnaire    Feeling of Stress: Not at all  Social Connections: Socially Integrated (07/30/2023)   Social Connection and Isolation Panel    Frequency of Communication with Friends and Family: Twice a week    Frequency of Social Gatherings with Friends and Family: Once a week     Attends Religious Services: More than 4 times per year    Active Member of [employer] or Organizations: Yes    Attends Banker Meetings: 1 to 4 times per year    Marital Status: Married    Tobacco Counseling Counseling given: Not Answered    Clinical Intake:  Pre-visit preparation completed: Yes  Pain : No/denies pain     BMI - recorded: 27.07 Nutritional Status: BMI 25 -29 Overweight Nutritional Risks: None Diabetes: No  No results found for: HGBA1C   How often do you need to have someone help you when you read instructions, pamphlets, or other written materials from your doctor or pharmacy?: 1 - Never  Interpreter Needed?: No  Comments: lives with husband Information entered by :: B.Bordeqaux,LPN   Activities of Daily Living     07/29/2023    2:09 PM 07/24/2023   11:19 AM  In your present state of health, do you have any difficulty performing the following activities:  Hearing? 1 0  Vision? 0 0  Difficulty concentrating or making decisions? 0 0  Walking or climbing stairs? 1 1  Dressing or bathing? 0 0  Doing errands, shopping? 0 0  Preparing Food and eating ? N N  Using the Toilet? N N  In the past six months, have you accidently leaked urine? Y Y  Do you have problems with loss of bowel control? N N  Managing your Medications? N N  Managing your Finances? N N  Housekeeping or managing your Housekeeping? N N    Patient Care Team: Donnie Galea, MD as PCP - General (Family Medicine) Arleen Lacer, MD as PCP - Cardiology (Cardiology) Dingeldein, Landon Pinion, MD as Consulting Physician (Ophthalmology) Isenstein, Arin L, MD as Consulting Physician (Dermatology) Alvino Aye, MD as Referring Physician (Audiology) Lyle San, DDS as Consulting Physician (  Dentistry)  I have updated your Care Teams any recent Medical Services you may have received from other providers in the past year.     Assessment:   This is a routine wellness  examination for Ayanna.  Hearing/Vision screen Hearing Screening - Comments:: Pt says hearing not good:hearing aids not effective all the time Vision Screening - Comments:: Pt says her vision is a little blurry w/glasses Dr Dingeldein-appt in Sept   Goals Addressed             This Visit's Progress    Increase physical activity   Not on track    07/30/23- weather permitting, I will continue to walk at least 20 minutes daily.      Patient Stated   Not on track    07/30/23-, I will continue to walk daily for about 20 minutes        Depression Screen     07/30/2023   11:48 AM 01/23/2023    2:03 PM 07/28/2022    2:19 PM 06/26/2022    4:03 PM 08/09/2020    2:04 PM 07/15/2018   11:04 AM 07/09/2017    8:09 AM  PHQ 2/9 Scores  PHQ - 2 Score 0 1 0 0 0 0 0  PHQ- 9 Score  4 3 3  0 0 0    Fall Risk     07/29/2023    2:09 PM 07/24/2023   11:19 AM 01/23/2023    2:03 PM 07/28/2022    2:15 PM 06/26/2022    4:02 PM  Fall Risk   Falls in the past year? 1 1 0 1 1  Number falls in past yr: 0 0 0 0 0  Injury with Fall? 1 1 0 0 0  Risk for fall due to : Impaired balance/gait   No Fall Risks No Fall Risks  Follow up Education provided;Falls prevention discussed  Falls evaluation completed Falls evaluation completed Falls evaluation completed    MEDICARE RISK AT HOME:  Medicare Risk at Home Any stairs in or around the home?: (Patient-Rptd) Yes If so, are there any without handrails?: (Patient-Rptd) No Home free of loose throw rugs in walkways, pet beds, electrical cords, etc?: (Patient-Rptd) Yes Adequate lighting in your home to reduce risk of falls?: (Patient-Rptd) Yes Life alert?: (Patient-Rptd) No Use of a cane, walker or w/c?: (Patient-Rptd) No Grab bars in the bathroom?: (Patient-Rptd) No Shower chair or bench in shower?: (Patient-Rptd) Yes Elevated toilet seat or a handicapped toilet?: (Patient-Rptd) No  TIMED UP AND GO:  Was the test performed?  No  Cognitive Function: 6CIT  completed    08/09/2020    2:17 PM 07/15/2018   11:47 AM 07/09/2017    8:17 AM 06/30/2016    2:21 PM 06/28/2015    2:16 PM  MMSE - Mini Mental State Exam  Orientation to time 5 5 5 5  5    Orientation to Place 5 5 5 5  5    Registration 3 3 3 3  3    Attention/ Calculation 5 0 0 0  0   Recall 3 3 3 3  3    Language- name 2 objects  0 0 0  0   Language- repeat 1 1 1 1 1   Language- follow 3 step command  0 3 3  3    Language- read & follow direction  0 0 0  0   Write a sentence  0 0 0  0   Copy design  0 0 0  0  Total score  17 20 20  20       Data saved with a previous flowsheet row definition        07/30/2023   11:52 AM  6CIT Screen  What Year? 0 points  What month? 0 points  What time? 0 points  Count back from 20 0 points  Months in reverse 0 points  Repeat phrase 0 points  Total Score 0 points    Immunizations Immunization History  Administered Date(s) Administered   Fluad Trivalent(High Dose 65+) 11/11/2022   Hep A / Hep B 07/21/2019, 08/25/2019   Influenza Whole 11/17/2005, 12/16/2007, 11/17/2009   Influenza, High Dose Seasonal PF 11/22/2014, 11/18/2016, 12/08/2017   Influenza-Unspecified 11/17/2013, 11/18/2015, 11/18/2016, 11/02/2018, 11/21/2019, 11/23/2020, 11/28/2021   PFIZER(Purple Top)SARS-COV-2 Vaccination 03/14/2019, 04/04/2019, 11/21/2019, 05/30/2020   Pfizer Covid-19 Vaccine Bivalent Booster 76yrs & up 11/23/2020   Pneumococcal Conjugate-13 06/29/2014   Pneumococcal Polysaccharide-23 03/02/2002   Td 02/18/1996, 03/28/2008, 07/21/2019   Zoster, Live 07/03/2015    Screening Tests Health Maintenance  Topic Date Due   Zoster Vaccines- Shingrix (1 of 2) 10/17/1985   COVID-19 Vaccine (7 - 2024-25 season) 05/17/2023   INFLUENZA VACCINE  09/18/2023   MAMMOGRAM  10/23/2023   Medicare Annual Wellness (AWV)  07/29/2024   DTaP/Tdap/Td (4 - Tdap) 07/20/2029   Pneumococcal Vaccine: 50+ Years  Completed   DEXA SCAN  Completed   HPV VACCINES  Aged Out    Meningococcal B Vaccine  Aged Out    Health Maintenance  Health Maintenance Due  Topic Date Due   Zoster Vaccines- Shingrix (1 of 2) 10/17/1985   COVID-19 Vaccine (7 - 2024-25 season) 05/17/2023   Health Maintenance Items Addressed: None needed at this time  Additional Screening:  Vision Screening: Recommended annual ophthalmology exams for early detection of glaucoma and other disorders of the eye. Would you like a referral to an eye doctor? No    Dental Screening: Recommended annual dental exams for proper oral hygiene  Community Resource Referral / Chronic Care Management: CRR required this visit?  No   CCM required this visit?  No   Plan:    I have personally reviewed and noted the following in the patient's chart:   Medical and social history Use of alcohol, tobacco or illicit drugs  Current medications and supplements including opioid prescriptions. Patient is not currently taking opioid prescriptions. Functional ability and status Nutritional status Physical activity Advanced directives List of other physicians Hospitalizations, surgeries, and ER visits in previous 12 months Vitals Screenings to include cognitive, depression, and falls Referrals and appointments  In addition, I have reviewed and discussed with patient certain preventive protocols, quality metrics, and best practice recommendations. A written personalized care plan for preventive services as well as general preventive health recommendations were provided to patient.   Nerissa Bannister, LPN   09/27/9145   After Visit Summary: (MyChart) Due to this being a telephonic visit, the after visit summary with patients personalized plan was offered to patient via MyChart   Notes: Nothing significant to report at this time.

## 2023-07-30 NOTE — Patient Instructions (Signed)
 Diane Tucker , Thank you for taking time out of your busy schedule to complete your Annual Wellness Visit with me. I enjoyed our conversation and look forward to speaking with you again next year. I, as well as your care team,  appreciate your ongoing commitment to your health goals. Please review the following plan we discussed and let me know if I can assist you in the future. Your Game plan/ To Do List    Follow up Visits: Next Medicare AWV with our clinical staff: 08/02/24 @ 11:30am   Have you seen your provider in the last 6 months (3 months if uncontrolled diabetes)? Yes Next Office Visit with your provider: 07/31/23  Clinician Recommendations:  Aim for 30 minutes of exercise or brisk walking, 6-8 glasses of water, and 5 servings of fruits and vegetables each day.       This is a list of the screening recommended for you and due dates:  Health Maintenance  Topic Date Due   Zoster (Shingles) Vaccine (1 of 2) 10/17/1985   COVID-19 Vaccine (7 - 2024-25 season) 05/17/2023   Flu Shot  09/18/2023   Mammogram  10/23/2023   Medicare Annual Wellness Visit  07/29/2024   DTaP/Tdap/Td vaccine (4 - Tdap) 07/20/2029   Pneumococcal Vaccine for age over 66  Completed   DEXA scan (bone density measurement)  Completed   HPV Vaccine  Aged Out   Meningitis B Vaccine  Aged Out    Advanced directives: (In Chart) A copy of your advanced directives are scanned into your chart should your provider ever need it. Advance Care Planning is important because it:  [x]  Makes sure you receive the medical care that is consistent with your values, goals, and preferences  [x]  It provides guidance to your family and loved ones and reduces their decisional burden about whether or not they are making the right decisions based on your wishes.  Follow the link provided in your after visit summary or read over the paperwork we have mailed to you to help you started getting your Advance Directives in place. If you need  assistance in completing these, please reach out to us  so that we can help you!

## 2023-07-31 ENCOUNTER — Ambulatory Visit (INDEPENDENT_AMBULATORY_CARE_PROVIDER_SITE_OTHER): Payer: Medicare Other | Admitting: Family Medicine

## 2023-07-31 ENCOUNTER — Encounter: Payer: Self-pay | Admitting: Family Medicine

## 2023-07-31 VITALS — BP 134/72 | HR 71 | Temp 97.9°F | Ht 62.91 in | Wt 146.6 lb

## 2023-07-31 DIAGNOSIS — M8580 Other specified disorders of bone density and structure, unspecified site: Secondary | ICD-10-CM | POA: Diagnosis not present

## 2023-07-31 DIAGNOSIS — Z7189 Other specified counseling: Secondary | ICD-10-CM

## 2023-07-31 DIAGNOSIS — M8588 Other specified disorders of bone density and structure, other site: Secondary | ICD-10-CM

## 2023-07-31 DIAGNOSIS — R202 Paresthesia of skin: Secondary | ICD-10-CM

## 2023-07-31 DIAGNOSIS — Z0000 Encounter for general adult medical examination without abnormal findings: Secondary | ICD-10-CM

## 2023-07-31 DIAGNOSIS — Z711 Person with feared health complaint in whom no diagnosis is made: Secondary | ICD-10-CM | POA: Diagnosis not present

## 2023-07-31 DIAGNOSIS — E038 Other specified hypothyroidism: Secondary | ICD-10-CM | POA: Diagnosis not present

## 2023-07-31 DIAGNOSIS — E785 Hyperlipidemia, unspecified: Secondary | ICD-10-CM

## 2023-07-31 MED ORDER — ALENDRONATE SODIUM 10 MG PO TABS: ORAL_TABLET | ORAL | Status: DC

## 2023-07-31 NOTE — Progress Notes (Unsigned)
 Flu previously done Shingles discussed with patient PNA previously done Tetanus 2021 COVID-vaccine previously done RSV vaccine d/w pt.   Colon cancer screening not due given her age. Mammogram 2024 Bone density test ordered 2025. Advance directive-husband designated if patient were incapacitated.  We prev opted to observe for memory changes.  No red flag events.  Prev B12 not low.  She has some slowing of recall.  MMSE 30 out of 30 today.   BLE edema.  Prev more in the summer, more persistent now.  Now with tingling in the BLE, shins and distally. She has altered sensation and she feels like her ankles are stiffer than prior.  Fall cautions d/w pt.  She tried OTC creams.     Hypothyroidism.  Compliant.  Taking levothyroxine at night due to fosamax  use.  TSH pending.  Abd girth inc noted but benign abdominal exam.   Osteopenia, prev was taking fosamax , had stopped and hasn't restarted. Had been off for about 5 months.  As of 07/2023, she has taken med for ~2 years.  Started 02/2021.  No ADE on med.  DXA 2022.  D/w pt about recheck DXA 2025, ordered. Labs d/w pt.    Elevated Cholesterol: Using medications without problems: yes Muscle aches: no Diet compliance: yes Exercise: d/w pt.    PMH and SH reviewed   Meds, vitals, and allergies reviewed.    ROS: Per HPI.  Unless specifically indicated otherwise in HPI, the patient denies:   General: fever. Eyes: acute vision changes ENT: sore throat Cardiovascular: chest pain Respiratory: SOB GI: vomiting GU: dysuria Musculoskeletal: acute back pain Derm: acute rash Neuro: acute motor dysfunction Psych: worsening mood Endocrine: polydipsia Heme: bleeding Allergy: hayfever   GEN: nad, alert and oriented HEENT: ncat NECK: supple w/o LA CV: rrr. PULM: ctab, no inc wob ABD: soft, +bs, nontender.  Normal bowel sounds.  Not distended. EXT: trace BLE edema SKIN: no acute rash Dec but not absent monofilament sensation B.   MMSE 30/30  today.

## 2023-07-31 NOTE — Patient Instructions (Addendum)
 You can call for a done density test at the [facility] [address] [place]  Recheck in about 6 months- we can recheck memory at the visit.   Try using over the counter knee high compression stockings.  Update me as needed.   We'll update you about the pending labs.   Take care.  Glad to see you.

## 2023-08-02 ENCOUNTER — Ambulatory Visit: Payer: Self-pay | Admitting: Family Medicine

## 2023-08-02 NOTE — Assessment & Plan Note (Signed)
Continue atorvastatin.  Previous labs discussed with patient.

## 2023-08-02 NOTE — Assessment & Plan Note (Signed)
 Osteopenia, prev was taking fosamax , had stopped and hasn't restarted. Had been off for about 5 months.  As of 07/2023, she has taken med for ~2 years.  Started 02/2021.  No ADE on med.  DXA 2022.  D/w pt about recheck DXA 2025, ordered. Labs d/w pt.   She previously had trouble swallowing the large pill for weekly Fosamax .  Discussed that she could take the smaller daily Fosamax  with 7 pills in 1 day, if that is easier for her to swallow.  Fosamax  cautions discussed with patient.

## 2023-08-02 NOTE — Assessment & Plan Note (Addendum)
 Without foot drop.  Fall cautions discussed with patient.  Some of her labs are resulted, some are still pending.  See notes on labs.  Would observe for now.  Previous B12 not low.  Discussed about concurrent edema, she can try over-the-counter compression stockings and update me as needed.

## 2023-08-02 NOTE — Assessment & Plan Note (Signed)
 Flu previously done Shingles discussed with patient PNA previously done Tetanus 2021 COVID-vaccine previously done RSV vaccine d/w pt.   Colon cancer screening not due given her age. Mammogram 2024 Bone density test ordered 2025. Advance directive-husband designated if patient were incapacitated.

## 2023-08-02 NOTE — Assessment & Plan Note (Signed)
Advance directive- husband designated if patient were incapacitated.  

## 2023-08-02 NOTE — Assessment & Plan Note (Signed)
 We prev opted to observe for memory changes.  No red flag events.  Prev B12 not low.  She has some slowing of recall.  MMSE 30 out of 30 today.  We can recheck periodically.

## 2023-08-02 NOTE — Assessment & Plan Note (Signed)
 Continue with levothyroxine.  See notes on TSH, pending.

## 2023-08-13 DIAGNOSIS — C44629 Squamous cell carcinoma of skin of left upper limb, including shoulder: Secondary | ICD-10-CM | POA: Diagnosis not present

## 2023-08-13 DIAGNOSIS — L905 Scar conditions and fibrosis of skin: Secondary | ICD-10-CM | POA: Diagnosis not present

## 2023-09-06 ENCOUNTER — Other Ambulatory Visit: Payer: Self-pay | Admitting: Family Medicine

## 2023-09-28 ENCOUNTER — Other Ambulatory Visit: Payer: Self-pay | Admitting: Family Medicine

## 2023-09-28 DIAGNOSIS — Z1231 Encounter for screening mammogram for malignant neoplasm of breast: Secondary | ICD-10-CM

## 2023-10-07 ENCOUNTER — Ambulatory Visit
Admission: RE | Admit: 2023-10-07 | Discharge: 2023-10-07 | Disposition: A | Discharge status: Home / Self Care | Source: Ambulatory Visit | Attending: Family Medicine | Admitting: Family Medicine

## 2023-10-07 DIAGNOSIS — M8580 Other specified disorders of bone density and structure, unspecified site: Secondary | ICD-10-CM | POA: Diagnosis not present

## 2023-10-07 DIAGNOSIS — M8589 Other specified disorders of bone density and structure, multiple sites: Secondary | ICD-10-CM | POA: Diagnosis not present

## 2023-10-07 DIAGNOSIS — M8588 Other specified disorders of bone density and structure, other site: Secondary | ICD-10-CM | POA: Insufficient documentation

## 2023-10-07 DIAGNOSIS — Z780 Asymptomatic menopausal state: Secondary | ICD-10-CM | POA: Diagnosis not present

## 2023-10-11 ENCOUNTER — Ambulatory Visit: Payer: Self-pay | Admitting: Family Medicine

## 2023-10-29 ENCOUNTER — Ambulatory Visit
Admission: RE | Admit: 2023-10-29 | Discharge: 2023-10-29 | Disposition: A | Discharge status: Home / Self Care | Source: Ambulatory Visit

## 2023-10-29 DIAGNOSIS — Z1231 Encounter for screening mammogram for malignant neoplasm of breast: Secondary | ICD-10-CM | POA: Diagnosis not present

## 2023-11-04 ENCOUNTER — Ambulatory Visit: Payer: Self-pay | Admitting: Family Medicine

## 2023-11-11 DIAGNOSIS — Z961 Presence of intraocular lens: Secondary | ICD-10-CM | POA: Diagnosis not present

## 2023-11-11 DIAGNOSIS — H538 Other visual disturbances: Secondary | ICD-10-CM | POA: Diagnosis not present

## 2023-11-19 DIAGNOSIS — Z23 Encounter for immunization: Secondary | ICD-10-CM | POA: Diagnosis not present

## 2023-11-25 ENCOUNTER — Other Ambulatory Visit: Payer: Self-pay | Admitting: Family Medicine

## 2024-01-28 ENCOUNTER — Ambulatory Visit: Admitting: Family Medicine

## 2024-01-28 VITALS — BP 132/80 | HR 70 | Temp 98.1°F | Ht 62.0 in | Wt 147.1 lb

## 2024-01-28 DIAGNOSIS — R4189 Other symptoms and signs involving cognitive functions and awareness: Secondary | ICD-10-CM | POA: Diagnosis not present

## 2024-01-28 DIAGNOSIS — R202 Paresthesia of skin: Secondary | ICD-10-CM | POA: Diagnosis not present

## 2024-01-28 LAB — BASIC METABOLIC PANEL WITH GFR
BUN: 19 mg/dL (ref 6–23)
CO2: 30 meq/L (ref 19–32)
Calcium: 10.2 mg/dL (ref 8.4–10.5)
Chloride: 101 meq/L (ref 96–112)
Creatinine, Ser: 0.91 mg/dL (ref 0.40–1.20)
GFR: 56.42 mL/min — ABNORMAL LOW (ref >60.00)
Glucose, Bld: 99 mg/dL (ref 70–99)
Potassium: 4.5 meq/L (ref 3.5–5.1)
Sodium: 140 meq/L (ref 135–145)

## 2024-01-28 LAB — TSH: TSH: 1.93 u[IU]/mL (ref 0.35–5.50)

## 2024-01-28 LAB — VITAMIN B12: Vitamin B-12: 1335 pg/mL — ABNORMAL HIGH (ref 211–911)

## 2024-01-28 NOTE — Progress Notes (Unsigned)
 Memory follow up.  She thought her status was similar to prior.  Some occ lapses/repetition.  Not red flag events.  She can still manage her finances.  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 She has some trouble with name recall.    No ADE on fosamax .  Started fosamax  2022.  D/w pt about options.  Reasonable to continue for now.  She agreed.    She has some altered sensation in the feet.  Some stiffness with walking.    D/w pt about RSV vaccine.  Reasonable to get done.   Meds, vitals, and allergies reviewed.   ROS: Per HPI unless specifically indicated in ROS section    RRR Ctab  MMSE 29/30, -1 for recall.    Altered sensation with dec monofilament but normal gross sensation o/w on the BLE.    Trace BLE   Normal sensation BUE

## 2024-01-28 NOTE — Patient Instructions (Signed)
 Go to the lab on the way out.   If you have mychart we'll likely use that to update you.    Take care.  Glad to see you.

## 2024-01-31 ENCOUNTER — Ambulatory Visit: Payer: Self-pay | Admitting: Family Medicine

## 2024-01-31 DIAGNOSIS — R202 Paresthesia of skin: Secondary | ICD-10-CM

## 2024-01-31 NOTE — Assessment & Plan Note (Signed)
 She still has a high functional baseline.  She plans on doing her taxes.  MMSE 29 out of 30.  -1 for recall.  Reasonable to check basic labs.  See notes on labs.  Recheck periodically.  Update me as needed.  She agrees to plan.  We talked about checking labs related to her paresthesia.  See notes on labs.

## 2024-02-15 ENCOUNTER — Telehealth: Payer: Self-pay | Admitting: Family Medicine

## 2024-02-15 NOTE — Telephone Encounter (Unsigned)
 Copied from CRM #[id number]. Topic: Clinical - Lab/Test Results >> Feb 15, 2024 10:35 AM Hadassah PARAS wrote: Reason for CRM: Pt is returning a missed call from Janae to go over lab results. Please advise on #[phone].

## 2024-02-15 NOTE — Telephone Encounter (Signed)
 Copied from CRM [id number]. Topic: Clinical - Lab/Test Results >> Feb 15, 2024  2:33 PM Wess RAMAN wrote: Reason for CRM: Santina over lab results with patient. She agrees to the referral for neurology   Callback #: [phone]

## 2024-02-16 NOTE — Telephone Encounter (Signed)
 Please see result note from 01/31/24.

## 2024-02-21 NOTE — Addendum Note (Signed)
 Addended by: CLEATUS LORELI RAMAN on: 02/21/2024 06:11 PM   Modules accepted: Orders

## 2024-07-07 ENCOUNTER — Ambulatory Visit: Admitting: Neurology

## 2024-08-02 ENCOUNTER — Ambulatory Visit
